# Patient Record
Sex: Male | Born: 1935
Health system: Southern US, Community
[De-identification: ages and names within clinical notes are randomized; demographics above are authoritative.]

## PROBLEM LIST (undated history)

## (undated) DIAGNOSIS — I4510 Unspecified right bundle-branch block: Secondary | ICD-10-CM

## (undated) DIAGNOSIS — M1990 Unspecified osteoarthritis, unspecified site: Secondary | ICD-10-CM

## (undated) DIAGNOSIS — G4730 Sleep apnea, unspecified: Secondary | ICD-10-CM

## (undated) DIAGNOSIS — I519 Heart disease, unspecified: Secondary | ICD-10-CM

## (undated) DIAGNOSIS — I82409 Acute embolism and thrombosis of unspecified deep veins of unspecified lower extremity: Secondary | ICD-10-CM

## (undated) DIAGNOSIS — I2699 Other pulmonary embolism without acute cor pulmonale: Secondary | ICD-10-CM

## (undated) DIAGNOSIS — I10 Essential (primary) hypertension: Secondary | ICD-10-CM

## (undated) DIAGNOSIS — G20A1 Parkinson's disease without dyskinesia, without mention of fluctuations: Secondary | ICD-10-CM

## (undated) DIAGNOSIS — G20 Parkinson's disease: Secondary | ICD-10-CM

## (undated) DIAGNOSIS — E785 Hyperlipidemia, unspecified: Secondary | ICD-10-CM

## (undated) HISTORY — DX: Unspecified osteoarthritis, unspecified site: M19.90

## (undated) HISTORY — DX: Other pulmonary embolism without acute cor pulmonale: I26.99

## (undated) HISTORY — DX: Essential (primary) hypertension: I10

## (undated) HISTORY — DX: Unspecified right bundle-branch block: I45.10

## (undated) HISTORY — PX: APPENDECTOMY: SHX54

## (undated) HISTORY — PX: CHOLECYSTECTOMY: SHX55

## (undated) HISTORY — PX: CATARACT EXTRACTION: SUR2

## (undated) HISTORY — DX: Sleep apnea, unspecified: G47.30

## (undated) HISTORY — DX: Heart disease, unspecified: I51.9

## (undated) HISTORY — PX: OTHER SURGICAL HISTORY: SHX169

## (undated) HISTORY — DX: Acute embolism and thrombosis of unspecified deep veins of unspecified lower extremity: I82.409

## (undated) HISTORY — PX: EYE SURGERY: SHX253

## (undated) HISTORY — DX: Hyperlipidemia, unspecified: E78.5

## (undated) HISTORY — DX: Parkinson's disease without dyskinesia, without mention of fluctuations: G20.A1

## (undated) HISTORY — DX: Parkinson's disease: G20

---

## 1999-08-20 ENCOUNTER — Encounter: Payer: Self-pay | Admitting: Gastroenterology

## 1999-08-20 ENCOUNTER — Ambulatory Visit (HOSPITAL_COMMUNITY): Admission: RE | Admit: 1999-08-20 | Discharge: 1999-08-20 | Payer: Self-pay | Admitting: Gastroenterology

## 1999-09-14 ENCOUNTER — Ambulatory Visit: Admission: RE | Admit: 1999-09-14 | Discharge: 1999-09-14 | Payer: Self-pay

## 2002-03-11 ENCOUNTER — Ambulatory Visit (HOSPITAL_BASED_OUTPATIENT_CLINIC_OR_DEPARTMENT_OTHER): Admission: RE | Admit: 2002-03-11 | Discharge: 2002-03-11 | Payer: Self-pay | Admitting: Internal Medicine

## 2002-05-23 ENCOUNTER — Encounter: Payer: Self-pay | Admitting: Orthopedic Surgery

## 2002-05-28 ENCOUNTER — Encounter (INDEPENDENT_AMBULATORY_CARE_PROVIDER_SITE_OTHER): Payer: Self-pay

## 2002-05-28 ENCOUNTER — Observation Stay (HOSPITAL_COMMUNITY): Admission: RE | Admit: 2002-05-28 | Discharge: 2002-05-29 | Payer: Self-pay | Admitting: Orthopedic Surgery

## 2003-10-15 ENCOUNTER — Encounter: Admission: RE | Admit: 2003-10-15 | Discharge: 2003-10-15 | Payer: Self-pay | Admitting: Internal Medicine

## 2004-12-29 ENCOUNTER — Ambulatory Visit: Payer: Self-pay | Admitting: Internal Medicine

## 2005-01-10 ENCOUNTER — Ambulatory Visit: Payer: Self-pay | Admitting: Internal Medicine

## 2005-01-14 ENCOUNTER — Ambulatory Visit (HOSPITAL_COMMUNITY): Admission: RE | Admit: 2005-01-14 | Discharge: 2005-01-14 | Payer: Self-pay | Admitting: Ophthalmology

## 2005-01-18 ENCOUNTER — Ambulatory Visit: Payer: Self-pay | Admitting: Internal Medicine

## 2005-02-16 ENCOUNTER — Ambulatory Visit: Payer: Self-pay | Admitting: Internal Medicine

## 2005-02-25 ENCOUNTER — Ambulatory Visit: Payer: Self-pay

## 2005-04-19 ENCOUNTER — Ambulatory Visit: Payer: Self-pay | Admitting: Internal Medicine

## 2005-07-14 ENCOUNTER — Ambulatory Visit (HOSPITAL_COMMUNITY): Admission: RE | Admit: 2005-07-14 | Discharge: 2005-07-14 | Payer: Self-pay | Admitting: Neurology

## 2005-07-21 ENCOUNTER — Ambulatory Visit: Payer: Self-pay | Admitting: Internal Medicine

## 2005-08-31 ENCOUNTER — Ambulatory Visit: Payer: Self-pay | Admitting: Internal Medicine

## 2005-12-22 ENCOUNTER — Ambulatory Visit: Payer: Self-pay | Admitting: Internal Medicine

## 2005-12-22 ENCOUNTER — Ambulatory Visit: Payer: Self-pay | Admitting: Cardiology

## 2005-12-22 ENCOUNTER — Encounter (INDEPENDENT_AMBULATORY_CARE_PROVIDER_SITE_OTHER): Payer: Self-pay | Admitting: Specialist

## 2005-12-22 ENCOUNTER — Inpatient Hospital Stay (HOSPITAL_COMMUNITY): Admission: AD | Admit: 2005-12-22 | Discharge: 2005-12-30 | Payer: Self-pay | Admitting: Internal Medicine

## 2005-12-23 ENCOUNTER — Ambulatory Visit: Payer: Self-pay | Admitting: Pulmonary Disease

## 2005-12-23 ENCOUNTER — Encounter: Payer: Self-pay | Admitting: Cardiology

## 2006-01-04 ENCOUNTER — Ambulatory Visit: Payer: Self-pay | Admitting: Internal Medicine

## 2006-01-06 ENCOUNTER — Ambulatory Visit: Payer: Self-pay | Admitting: Internal Medicine

## 2006-01-13 ENCOUNTER — Ambulatory Visit: Payer: Self-pay | Admitting: Internal Medicine

## 2006-01-20 ENCOUNTER — Ambulatory Visit: Payer: Self-pay | Admitting: Internal Medicine

## 2006-02-17 ENCOUNTER — Ambulatory Visit: Payer: Self-pay | Admitting: Internal Medicine

## 2006-03-22 ENCOUNTER — Ambulatory Visit: Payer: Self-pay | Admitting: Internal Medicine

## 2006-06-28 ENCOUNTER — Ambulatory Visit: Payer: Self-pay | Admitting: Internal Medicine

## 2006-09-27 ENCOUNTER — Ambulatory Visit: Payer: Self-pay | Admitting: Internal Medicine

## 2006-09-27 LAB — CONVERTED CEMR LAB
ALT: 9 units/L (ref 0–40)
AST: 42 units/L — ABNORMAL HIGH (ref 0–37)
Albumin: 4 g/dL (ref 3.5–5.2)
Alkaline Phosphatase: 75 units/L (ref 39–117)
BUN: 17 mg/dL (ref 6–23)
Bilirubin, Direct: 0.2 mg/dL (ref 0.0–0.3)
CO2: 32 meq/L (ref 19–32)
Calcium: 10.2 mg/dL (ref 8.4–10.5)
Chloride: 103 meq/L (ref 96–112)
Chol/HDL Ratio, serum: 5.3
Cholesterol: 170 mg/dL (ref 0–200)
Creatinine, Ser: 1.1 mg/dL (ref 0.4–1.5)
GFR calc non Af Amer: 70 mL/min
Glomerular Filtration Rate, Af Am: 85 mL/min/{1.73_m2}
Glucose, Bld: 98 mg/dL (ref 70–99)
HDL: 32 mg/dL — ABNORMAL LOW (ref >39.0)
LDL Cholesterol: 118 mg/dL — ABNORMAL HIGH (ref 0–99)
Potassium: 4.5 meq/L (ref 3.5–5.1)
Sodium: 142 meq/L (ref 135–145)
Total Bilirubin: 0.9 mg/dL (ref 0.3–1.2)
Total Protein: 7.3 g/dL (ref 6.0–8.3)
Triglyceride fasting, serum: 98 mg/dL (ref 0–149)
VLDL: 20 mg/dL (ref 0–40)

## 2007-01-24 ENCOUNTER — Ambulatory Visit: Payer: Self-pay | Admitting: Internal Medicine

## 2007-01-24 ENCOUNTER — Encounter: Admission: RE | Admit: 2007-01-24 | Discharge: 2007-01-24 | Payer: Self-pay | Admitting: Internal Medicine

## 2007-04-12 ENCOUNTER — Encounter: Admission: RE | Admit: 2007-04-12 | Discharge: 2007-04-12 | Payer: Self-pay | Admitting: Orthopedic Surgery

## 2007-04-25 ENCOUNTER — Ambulatory Visit: Payer: Self-pay | Admitting: Pulmonary Disease

## 2007-05-23 ENCOUNTER — Ambulatory Visit: Payer: Self-pay | Admitting: Internal Medicine

## 2007-05-23 LAB — CONVERTED CEMR LAB
ALT: 26 units/L (ref 0–40)
AST: 52 units/L — ABNORMAL HIGH (ref 0–37)
Albumin: 3.9 g/dL (ref 3.5–5.2)
Alkaline Phosphatase: 74 units/L (ref 39–117)
BUN: 22 mg/dL (ref 6–23)
Bilirubin, Direct: 0.2 mg/dL (ref 0.0–0.3)
CO2: 30 meq/L (ref 19–32)
Calcium: 9.8 mg/dL (ref 8.4–10.5)
Chloride: 98 meq/L (ref 96–112)
Cholesterol: 174 mg/dL (ref 0–200)
Creatinine, Ser: 1 mg/dL (ref 0.4–1.5)
GFR calc Af Amer: 95 mL/min
GFR calc non Af Amer: 78 mL/min
Glucose, Bld: 92 mg/dL (ref 70–99)
HDL: 31.3 mg/dL — ABNORMAL LOW (ref >39.0)
LDL Cholesterol: 111 mg/dL — ABNORMAL HIGH (ref 0–99)
Potassium: 3.9 meq/L (ref 3.5–5.1)
Sodium: 140 meq/L (ref 135–145)
Total Bilirubin: 1 mg/dL (ref 0.3–1.2)
Total CHOL/HDL Ratio: 5.6
Total Protein: 6.7 g/dL (ref 6.0–8.3)
Triglycerides: 158 mg/dL — ABNORMAL HIGH (ref 0–149)
VLDL: 32 mg/dL (ref 0–40)

## 2007-07-02 ENCOUNTER — Ambulatory Visit: Payer: Self-pay | Admitting: Internal Medicine

## 2007-07-02 DIAGNOSIS — I519 Heart disease, unspecified: Secondary | ICD-10-CM | POA: Insufficient documentation

## 2007-07-02 DIAGNOSIS — I10 Essential (primary) hypertension: Secondary | ICD-10-CM

## 2007-07-02 DIAGNOSIS — M1711 Unilateral primary osteoarthritis, right knee: Secondary | ICD-10-CM

## 2007-07-02 DIAGNOSIS — G20 Parkinson's disease: Secondary | ICD-10-CM

## 2007-07-02 DIAGNOSIS — E785 Hyperlipidemia, unspecified: Secondary | ICD-10-CM | POA: Insufficient documentation

## 2007-07-02 DIAGNOSIS — M1611 Unilateral primary osteoarthritis, right hip: Secondary | ICD-10-CM | POA: Insufficient documentation

## 2007-07-02 DIAGNOSIS — M109 Gout, unspecified: Secondary | ICD-10-CM | POA: Insufficient documentation

## 2007-07-02 DIAGNOSIS — I4510 Unspecified right bundle-branch block: Secondary | ICD-10-CM

## 2007-07-02 HISTORY — DX: Essential (primary) hypertension: I10

## 2007-08-13 DIAGNOSIS — I2699 Other pulmonary embolism without acute cor pulmonale: Secondary | ICD-10-CM

## 2007-08-13 HISTORY — DX: Other pulmonary embolism without acute cor pulmonale: I26.99

## 2007-08-20 ENCOUNTER — Inpatient Hospital Stay (HOSPITAL_COMMUNITY): Admission: RE | Admit: 2007-08-20 | Discharge: 2007-08-24 | Payer: Self-pay | Admitting: Orthopedic Surgery

## 2007-08-20 HISTORY — PX: TOTAL HIP ARTHROPLASTY: SHX124

## 2007-08-23 ENCOUNTER — Ambulatory Visit: Payer: Self-pay | Admitting: Physical Medicine & Rehabilitation

## 2007-09-26 ENCOUNTER — Ambulatory Visit: Payer: Self-pay | Admitting: Internal Medicine

## 2007-09-27 LAB — CONVERTED CEMR LAB
ALT: 11 units/L (ref 0–53)
AST: 33 units/L (ref 0–37)
Albumin: 3.6 g/dL (ref 3.5–5.2)
Alkaline Phosphatase: 150 units/L — ABNORMAL HIGH (ref 39–117)
BUN: 25 mg/dL — ABNORMAL HIGH (ref 6–23)
Bilirubin, Direct: 0.2 mg/dL (ref 0.0–0.3)
CO2: 30 meq/L (ref 19–32)
Calcium: 9.6 mg/dL (ref 8.4–10.5)
Chloride: 105 meq/L (ref 96–112)
Cholesterol: 158 mg/dL (ref 0–200)
Creatinine, Ser: 1.4 mg/dL (ref 0.4–1.5)
GFR calc Af Amer: 64 mL/min
GFR calc non Af Amer: 53 mL/min
Glucose, Bld: 119 mg/dL — ABNORMAL HIGH (ref 70–99)
HDL: 28.6 mg/dL — ABNORMAL LOW (ref >39.0)
LDL Cholesterol: 95 mg/dL (ref 0–99)
Potassium: 3.9 meq/L (ref 3.5–5.1)
Sodium: 142 meq/L (ref 135–145)
Total Bilirubin: 1 mg/dL (ref 0.3–1.2)
Total CHOL/HDL Ratio: 5.5
Total Protein: 6.6 g/dL (ref 6.0–8.3)
Triglycerides: 173 mg/dL — ABNORMAL HIGH (ref 0–149)
VLDL: 35 mg/dL (ref 0–40)

## 2007-09-28 ENCOUNTER — Telehealth: Payer: Self-pay | Admitting: Internal Medicine

## 2007-09-30 ENCOUNTER — Ambulatory Visit: Payer: Self-pay | Admitting: Vascular Surgery

## 2007-09-30 ENCOUNTER — Ambulatory Visit: Payer: Self-pay | Admitting: Internal Medicine

## 2007-09-30 ENCOUNTER — Inpatient Hospital Stay (HOSPITAL_COMMUNITY): Admission: EM | Admit: 2007-09-30 | Discharge: 2007-10-04 | Payer: Self-pay | Admitting: Emergency Medicine

## 2007-09-30 ENCOUNTER — Encounter: Payer: Self-pay | Admitting: Internal Medicine

## 2007-09-30 ENCOUNTER — Ambulatory Visit: Payer: Self-pay | Admitting: Cardiology

## 2007-10-04 ENCOUNTER — Telehealth (INDEPENDENT_AMBULATORY_CARE_PROVIDER_SITE_OTHER): Payer: Self-pay | Admitting: *Deleted

## 2007-10-08 ENCOUNTER — Ambulatory Visit: Payer: Self-pay | Admitting: Internal Medicine

## 2007-10-08 DIAGNOSIS — I2699 Other pulmonary embolism without acute cor pulmonale: Secondary | ICD-10-CM

## 2007-10-08 LAB — CONVERTED CEMR LAB
INR: 1.3
Prothrombin Time: 14.1 s

## 2007-10-10 ENCOUNTER — Telehealth: Payer: Self-pay | Admitting: Internal Medicine

## 2007-10-11 ENCOUNTER — Telehealth (INDEPENDENT_AMBULATORY_CARE_PROVIDER_SITE_OTHER): Payer: Self-pay | Admitting: *Deleted

## 2007-10-12 ENCOUNTER — Ambulatory Visit: Payer: Self-pay | Admitting: Internal Medicine

## 2007-10-12 LAB — CONVERTED CEMR LAB
INR: 1.6
Prothrombin Time: 15.5 s

## 2007-10-16 DIAGNOSIS — Z86718 Personal history of other venous thrombosis and embolism: Secondary | ICD-10-CM

## 2007-10-17 ENCOUNTER — Encounter: Payer: Self-pay | Admitting: Internal Medicine

## 2007-10-17 ENCOUNTER — Telehealth: Payer: Self-pay | Admitting: Family Medicine

## 2007-10-22 ENCOUNTER — Ambulatory Visit: Payer: Self-pay | Admitting: Internal Medicine

## 2007-11-04 IMAGING — CR DG CHEST 1V PORT
2 series · 2 of 2 positions shown · non-contrast
Comparison: None.

Portable semi upright AP chest, 09/30/2007, [DATE] hours.
INDICATION: Shortness of breath.

[view not recorded (1 of 2)]
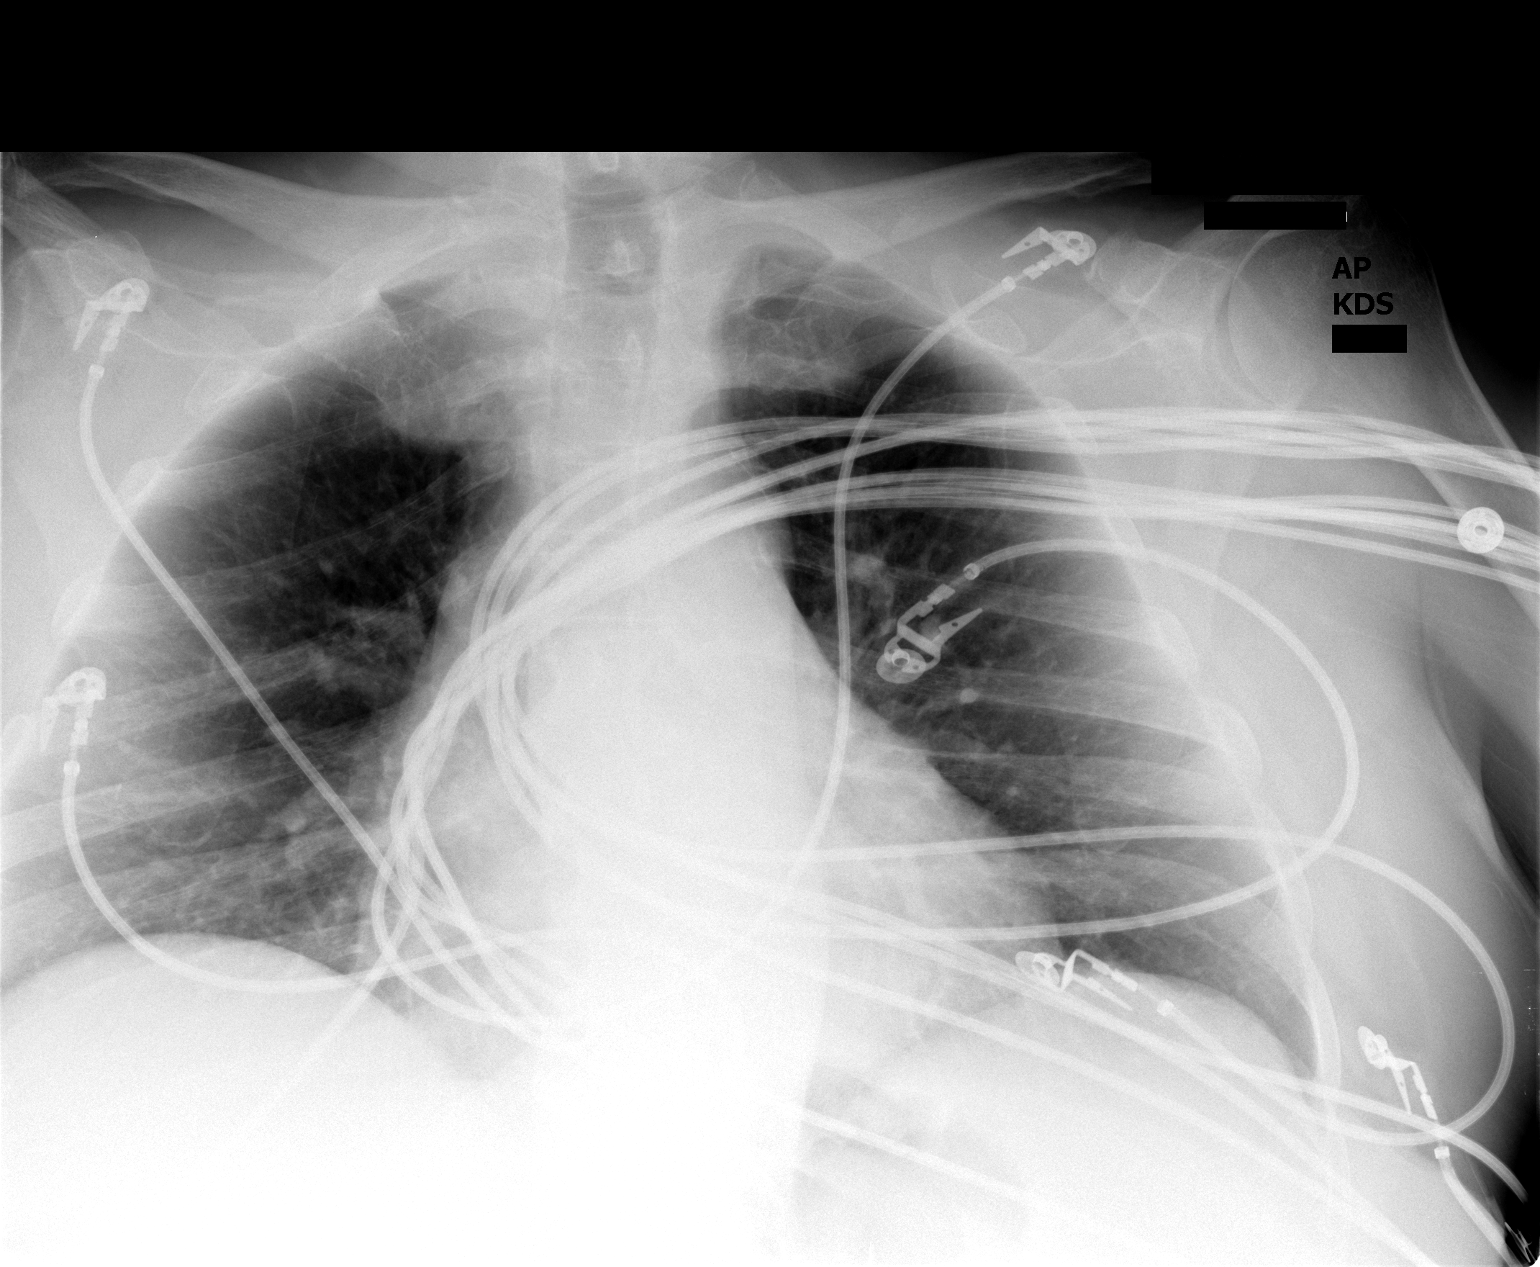

[view not recorded (2 of 2)]
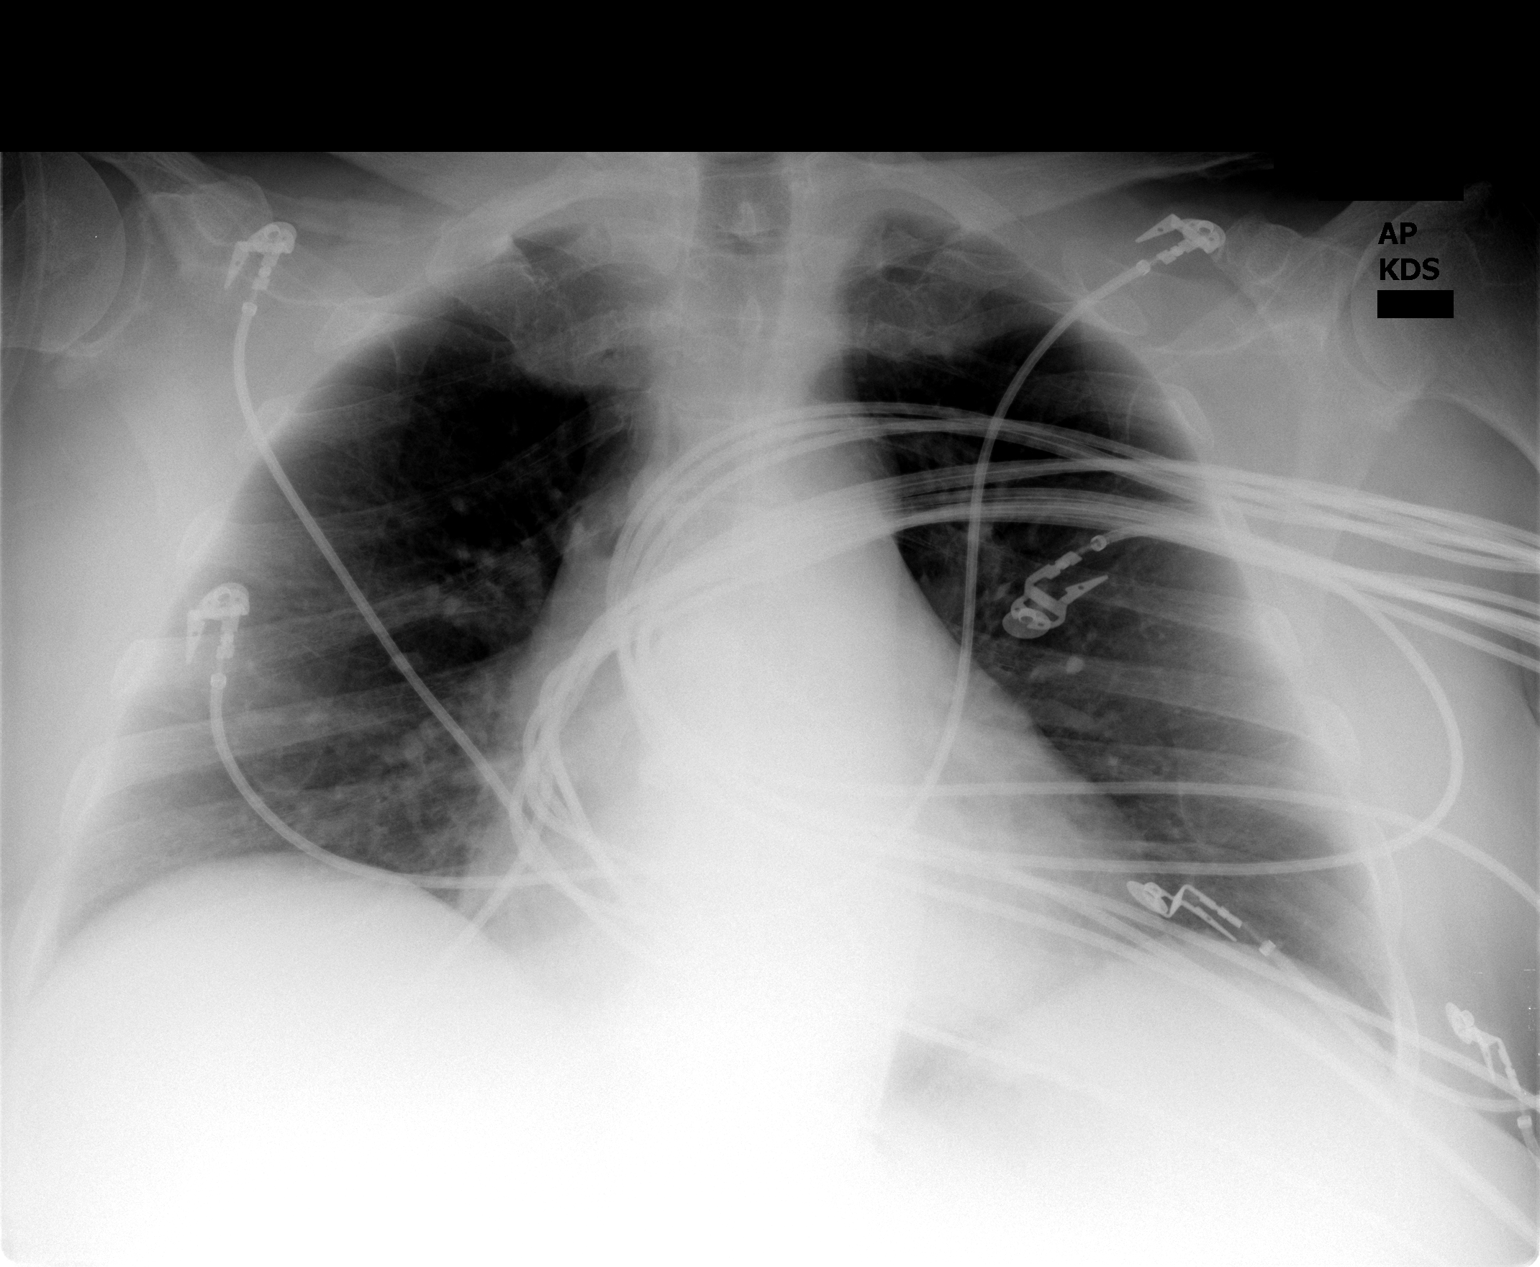

[2 of 2 positions shown; findings below may reference images not displayed]

FINDINGS: Reversed lordotic projection. Heart size mildly enlarged. Numerous
monitoring leads over the chest. No edema or focal airspace consolidation
identified.

Resolution of left lower lobe airspace disease compared to prior examination.
IMPRESSION: Resolved left lower lobe opacity, without active cardiopulmonary
disease.

## 2007-11-05 ENCOUNTER — Ambulatory Visit: Payer: Self-pay | Admitting: Internal Medicine

## 2007-11-05 LAB — CONVERTED CEMR LAB
INR: 2.7
Prothrombin Time: 19.9 s

## 2007-11-14 ENCOUNTER — Ambulatory Visit: Payer: Self-pay

## 2007-11-15 ENCOUNTER — Ambulatory Visit: Payer: Self-pay | Admitting: Cardiology

## 2007-11-19 ENCOUNTER — Ambulatory Visit: Payer: Self-pay | Admitting: Internal Medicine

## 2007-11-19 LAB — CONVERTED CEMR LAB
INR: 3.1
Prothrombin Time: 21.3 s

## 2007-12-03 ENCOUNTER — Ambulatory Visit: Payer: Self-pay | Admitting: Internal Medicine

## 2007-12-03 LAB — CONVERTED CEMR LAB
INR: 2.9
Prothrombin Time: 20.6 s

## 2007-12-19 ENCOUNTER — Ambulatory Visit: Payer: Self-pay | Admitting: Internal Medicine

## 2007-12-19 LAB — CONVERTED CEMR LAB: Prothrombin Time: 17.8 s

## 2007-12-20 LAB — CONVERTED CEMR LAB
ALT: 9 units/L (ref 0–53)
AST: 25 units/L (ref 0–37)
Basophils Relative: 0.8 % (ref 0.0–1.0)
Bilirubin, Direct: 0.1 mg/dL (ref 0.0–0.3)
CO2: 30 meq/L (ref 19–32)
Calcium: 9.6 mg/dL (ref 8.4–10.5)
Chloride: 101 meq/L (ref 96–112)
Creatinine, Ser: 0.9 mg/dL (ref 0.4–1.5)
Eosinophils Relative: 1.9 % (ref 0.0–5.0)
Glucose, Bld: 94 mg/dL (ref 70–99)
Lymphocytes Relative: 23.7 % (ref 12.0–46.0)
Platelets: 168 10*3/uL (ref 150–400)
RBC: 5.2 M/uL (ref 4.22–5.81)
RDW: 15.4 % — ABNORMAL HIGH (ref 11.5–14.6)
Total Protein: 7.1 g/dL (ref 6.0–8.3)
WBC: 5 10*3/uL (ref 4.5–10.5)

## 2008-02-06 ENCOUNTER — Ambulatory Visit: Payer: Self-pay | Admitting: Internal Medicine

## 2008-02-06 DIAGNOSIS — R609 Edema, unspecified: Secondary | ICD-10-CM | POA: Insufficient documentation

## 2008-02-20 ENCOUNTER — Ambulatory Visit: Payer: Self-pay | Admitting: Internal Medicine

## 2008-03-24 ENCOUNTER — Ambulatory Visit: Payer: Self-pay | Admitting: Internal Medicine

## 2008-03-24 DIAGNOSIS — R269 Unspecified abnormalities of gait and mobility: Secondary | ICD-10-CM

## 2008-03-30 ENCOUNTER — Encounter: Admission: RE | Admit: 2008-03-30 | Discharge: 2008-03-30 | Payer: Self-pay | Admitting: Internal Medicine

## 2008-04-10 ENCOUNTER — Ambulatory Visit: Payer: Self-pay | Admitting: Internal Medicine

## 2008-04-10 LAB — CONVERTED CEMR LAB
INR: 1.8
Prothrombin Time: 16.4 s

## 2008-04-11 ENCOUNTER — Telehealth: Payer: Self-pay | Admitting: Internal Medicine

## 2008-04-24 ENCOUNTER — Ambulatory Visit: Payer: Self-pay | Admitting: Internal Medicine

## 2008-04-24 LAB — CONVERTED CEMR LAB
INR: 1.9
Prothrombin Time: 16.8 s

## 2008-05-13 ENCOUNTER — Telehealth: Payer: Self-pay | Admitting: Internal Medicine

## 2008-05-26 ENCOUNTER — Ambulatory Visit: Payer: Self-pay | Admitting: Internal Medicine

## 2008-06-04 ENCOUNTER — Ambulatory Visit: Payer: Self-pay | Admitting: Cardiology

## 2008-06-04 LAB — CONVERTED CEMR LAB
AST: 29 units/L (ref 0–37)
Albumin: 4.1 g/dL (ref 3.5–5.2)
Alkaline Phosphatase: 85 units/L (ref 39–117)
BUN: 23 mg/dL (ref 6–23)
Bilirubin, Direct: 0.1 mg/dL (ref 0.0–0.3)
Chloride: 99 meq/L (ref 96–112)
GFR calc non Af Amer: 78 mL/min
Glucose, Bld: 92 mg/dL (ref 70–99)
Potassium: 3.4 meq/L — ABNORMAL LOW (ref 3.5–5.1)
Sodium: 138 meq/L (ref 135–145)
VLDL: 45 mg/dL — ABNORMAL HIGH (ref 0–40)

## 2008-06-11 ENCOUNTER — Ambulatory Visit: Payer: Self-pay | Admitting: Internal Medicine

## 2008-06-23 ENCOUNTER — Ambulatory Visit: Payer: Self-pay | Admitting: Cardiology

## 2008-06-23 LAB — CONVERTED CEMR LAB
CO2: 32 meq/L (ref 19–32)
Calcium: 9.5 mg/dL (ref 8.4–10.5)
Chloride: 102 meq/L (ref 96–112)
Glucose, Bld: 113 mg/dL — ABNORMAL HIGH (ref 70–99)
Sodium: 142 meq/L (ref 135–145)

## 2008-07-10 ENCOUNTER — Ambulatory Visit: Payer: Self-pay | Admitting: Internal Medicine

## 2008-07-13 ENCOUNTER — Encounter: Admission: RE | Admit: 2008-07-13 | Discharge: 2008-07-13 | Payer: Self-pay | Admitting: Internal Medicine

## 2008-07-21 ENCOUNTER — Ambulatory Visit: Payer: Self-pay | Admitting: Cardiology

## 2008-07-21 LAB — CONVERTED CEMR LAB
Albumin: 3.9 g/dL (ref 3.5–5.2)
Cholesterol: 103 mg/dL (ref 0–200)
HDL: 32.9 mg/dL — ABNORMAL LOW (ref >39.0)
LDL Cholesterol: 50 mg/dL (ref 0–99)
Total CHOL/HDL Ratio: 3.1
Total Protein: 7.2 g/dL (ref 6.0–8.3)
Triglycerides: 103 mg/dL (ref 0–149)
VLDL: 21 mg/dL (ref 0–40)

## 2008-07-30 ENCOUNTER — Ambulatory Visit: Payer: Self-pay | Admitting: Internal Medicine

## 2008-07-30 LAB — CONVERTED CEMR LAB
INR: 2.4
Prothrombin Time: 18.7 s

## 2008-08-27 ENCOUNTER — Ambulatory Visit: Payer: Self-pay | Admitting: Internal Medicine

## 2008-08-27 LAB — CONVERTED CEMR LAB
INR: 3.2
Prothrombin Time: 21.5 s

## 2008-09-05 ENCOUNTER — Encounter: Payer: Self-pay | Admitting: Internal Medicine

## 2008-09-17 ENCOUNTER — Encounter: Admission: RE | Admit: 2008-09-17 | Discharge: 2008-12-10 | Payer: Self-pay | Admitting: Neurosurgery

## 2008-09-22 ENCOUNTER — Ambulatory Visit: Payer: Self-pay | Admitting: Internal Medicine

## 2008-10-23 ENCOUNTER — Ambulatory Visit: Payer: Self-pay | Admitting: Internal Medicine

## 2008-10-25 ENCOUNTER — Emergency Department (HOSPITAL_BASED_OUTPATIENT_CLINIC_OR_DEPARTMENT_OTHER): Admission: EM | Admit: 2008-10-25 | Discharge: 2008-10-25 | Payer: Self-pay | Admitting: Emergency Medicine

## 2008-10-27 ENCOUNTER — Encounter: Admission: RE | Admit: 2008-10-27 | Discharge: 2008-10-27 | Payer: Self-pay | Admitting: Internal Medicine

## 2008-10-27 ENCOUNTER — Ambulatory Visit: Payer: Self-pay | Admitting: Internal Medicine

## 2008-11-19 ENCOUNTER — Encounter: Payer: Self-pay | Admitting: Internal Medicine

## 2008-11-20 ENCOUNTER — Ambulatory Visit: Payer: Self-pay | Admitting: Internal Medicine

## 2008-11-20 LAB — CONVERTED CEMR LAB: INR: 1.9

## 2008-12-17 ENCOUNTER — Ambulatory Visit: Payer: Self-pay | Admitting: Cardiology

## 2008-12-19 ENCOUNTER — Ambulatory Visit: Payer: Self-pay | Admitting: Internal Medicine

## 2008-12-19 LAB — CONVERTED CEMR LAB
INR: 1.5
Prothrombin Time: 15.2 s

## 2008-12-24 ENCOUNTER — Ambulatory Visit: Payer: Self-pay | Admitting: Cardiology

## 2008-12-24 ENCOUNTER — Encounter: Payer: Self-pay | Admitting: Internal Medicine

## 2008-12-24 ENCOUNTER — Ambulatory Visit: Payer: Self-pay

## 2008-12-24 LAB — CONVERTED CEMR LAB
ALT: 6 units/L (ref 0–53)
AST: 26 units/L (ref 0–37)
Albumin: 3.7 g/dL (ref 3.5–5.2)
Alkaline Phosphatase: 83 units/L (ref 39–117)
HDL: 35 mg/dL — ABNORMAL LOW (ref >39.0)
Total CHOL/HDL Ratio: 2.9
Triglycerides: 84 mg/dL (ref 0–149)

## 2009-01-02 ENCOUNTER — Ambulatory Visit: Payer: Self-pay | Admitting: Internal Medicine

## 2009-01-02 LAB — CONVERTED CEMR LAB: Prothrombin Time: 16.6 s

## 2009-01-05 ENCOUNTER — Telehealth: Payer: Self-pay | Admitting: Internal Medicine

## 2009-01-23 ENCOUNTER — Ambulatory Visit: Payer: Self-pay | Admitting: Internal Medicine

## 2009-01-23 LAB — CONVERTED CEMR LAB: INR: 1.9

## 2009-02-20 ENCOUNTER — Telehealth: Payer: Self-pay | Admitting: Internal Medicine

## 2009-02-20 ENCOUNTER — Ambulatory Visit: Payer: Self-pay | Admitting: Internal Medicine

## 2009-02-20 LAB — CONVERTED CEMR LAB: Prothrombin Time: 19.2 s

## 2009-02-23 ENCOUNTER — Ambulatory Visit: Payer: Self-pay | Admitting: Internal Medicine

## 2009-02-23 ENCOUNTER — Telehealth: Payer: Self-pay | Admitting: *Deleted

## 2009-02-24 LAB — CONVERTED CEMR LAB
ALT: 8 units/L (ref 0–53)
AST: 24 units/L (ref 0–37)
Albumin: 3.5 g/dL (ref 3.5–5.2)
BUN: 19 mg/dL (ref 6–23)
Basophils Relative: 0.3 % (ref 0.0–3.0)
CO2: 33 meq/L — ABNORMAL HIGH (ref 19–32)
Calcium: 9 mg/dL (ref 8.4–10.5)
Chloride: 100 meq/L (ref 96–112)
Creatinine, Ser: 1 mg/dL (ref 0.4–1.5)
Eosinophils Relative: 2.5 % (ref 0.0–5.0)
Hemoglobin: 13.9 g/dL (ref 13.0–17.0)
Lymphocytes Relative: 14.7 % (ref 12.0–46.0)
MCHC: 32.3 g/dL (ref 30.0–36.0)
MCV: 82.3 fL (ref 78.0–100.0)
Neutro Abs: 2.8 10*3/uL (ref 1.4–7.7)
Neutrophils Relative %: 66 % (ref 43.0–77.0)
RBC: 5.22 M/uL (ref 4.22–5.81)
TSH: 0.99 microintl units/mL (ref 0.35–5.50)
Total Protein: 7.6 g/dL (ref 6.0–8.3)
WBC: 4.2 10*3/uL — ABNORMAL LOW (ref 4.5–10.5)

## 2009-02-27 ENCOUNTER — Encounter: Payer: Self-pay | Admitting: Internal Medicine

## 2009-03-04 ENCOUNTER — Telehealth: Payer: Self-pay | Admitting: Internal Medicine

## 2009-03-09 ENCOUNTER — Telehealth: Payer: Self-pay | Admitting: Internal Medicine

## 2009-03-17 ENCOUNTER — Telehealth: Payer: Self-pay | Admitting: Internal Medicine

## 2009-03-18 ENCOUNTER — Ambulatory Visit: Payer: Self-pay | Admitting: Internal Medicine

## 2009-03-18 LAB — CONVERTED CEMR LAB: Prothrombin Time: 19.5 s

## 2009-04-01 ENCOUNTER — Encounter: Payer: Self-pay | Admitting: Internal Medicine

## 2009-04-08 ENCOUNTER — Telehealth (INDEPENDENT_AMBULATORY_CARE_PROVIDER_SITE_OTHER): Payer: Self-pay | Admitting: *Deleted

## 2009-04-15 ENCOUNTER — Ambulatory Visit: Payer: Self-pay | Admitting: Internal Medicine

## 2009-04-15 LAB — CONVERTED CEMR LAB
BUN: 20 mg/dL (ref 6–23)
Calcium: 9.4 mg/dL (ref 8.4–10.5)
Creatinine, Ser: 1 mg/dL (ref 0.4–1.5)
GFR calc non Af Amer: 77.78 mL/min (ref >60)
Glucose, Bld: 98 mg/dL (ref 70–99)
Potassium: 4.1 meq/L (ref 3.5–5.1)

## 2009-04-23 ENCOUNTER — Ambulatory Visit: Payer: Self-pay | Admitting: Family Medicine

## 2009-04-27 ENCOUNTER — Telehealth (INDEPENDENT_AMBULATORY_CARE_PROVIDER_SITE_OTHER): Payer: Self-pay | Admitting: *Deleted

## 2009-04-28 ENCOUNTER — Ambulatory Visit: Payer: Self-pay | Admitting: Family Medicine

## 2009-05-07 ENCOUNTER — Telehealth: Payer: Self-pay | Admitting: Internal Medicine

## 2009-05-13 ENCOUNTER — Ambulatory Visit: Payer: Self-pay | Admitting: Internal Medicine

## 2009-05-18 ENCOUNTER — Ambulatory Visit: Payer: Self-pay | Admitting: Diagnostic Radiology

## 2009-05-18 ENCOUNTER — Ambulatory Visit (HOSPITAL_BASED_OUTPATIENT_CLINIC_OR_DEPARTMENT_OTHER): Admission: RE | Admit: 2009-05-18 | Discharge: 2009-05-18 | Payer: Self-pay | Admitting: Urology

## 2009-05-18 ENCOUNTER — Emergency Department (HOSPITAL_BASED_OUTPATIENT_CLINIC_OR_DEPARTMENT_OTHER): Admission: EM | Admit: 2009-05-18 | Discharge: 2009-05-18 | Payer: Self-pay | Admitting: Emergency Medicine

## 2009-06-08 ENCOUNTER — Encounter: Payer: Self-pay | Admitting: Internal Medicine

## 2009-06-10 ENCOUNTER — Ambulatory Visit: Payer: Self-pay | Admitting: Internal Medicine

## 2009-06-10 LAB — CONVERTED CEMR LAB: INR: 2.8

## 2009-07-08 ENCOUNTER — Ambulatory Visit: Payer: Self-pay | Admitting: Internal Medicine

## 2009-08-05 ENCOUNTER — Ambulatory Visit: Payer: Self-pay | Admitting: Internal Medicine

## 2009-08-05 LAB — CONVERTED CEMR LAB
INR: 2.7
Prothrombin Time: 19.8 s

## 2009-09-02 ENCOUNTER — Ambulatory Visit: Payer: Self-pay | Admitting: Internal Medicine

## 2009-09-02 LAB — CONVERTED CEMR LAB: Prothrombin Time: 16.8 s

## 2009-10-02 ENCOUNTER — Ambulatory Visit: Payer: Self-pay | Admitting: Internal Medicine

## 2009-10-02 LAB — CONVERTED CEMR LAB: Prothrombin Time: 20.5 s

## 2009-10-09 ENCOUNTER — Encounter (INDEPENDENT_AMBULATORY_CARE_PROVIDER_SITE_OTHER): Payer: Self-pay | Admitting: *Deleted

## 2009-10-30 ENCOUNTER — Encounter (INDEPENDENT_AMBULATORY_CARE_PROVIDER_SITE_OTHER): Payer: Self-pay | Admitting: *Deleted

## 2009-10-30 ENCOUNTER — Ambulatory Visit: Payer: Self-pay | Admitting: Internal Medicine

## 2009-11-02 ENCOUNTER — Telehealth: Payer: Self-pay | Admitting: Internal Medicine

## 2009-11-02 ENCOUNTER — Encounter: Payer: Self-pay | Admitting: Internal Medicine

## 2009-11-13 ENCOUNTER — Encounter (INDEPENDENT_AMBULATORY_CARE_PROVIDER_SITE_OTHER): Payer: Self-pay | Admitting: *Deleted

## 2009-11-27 ENCOUNTER — Ambulatory Visit: Payer: Self-pay | Admitting: Internal Medicine

## 2009-11-27 LAB — CONVERTED CEMR LAB: Prothrombin Time: 16.1 s

## 2009-12-16 ENCOUNTER — Ambulatory Visit: Payer: Self-pay | Admitting: Internal Medicine

## 2009-12-16 LAB — CONVERTED CEMR LAB: INR: 1.2

## 2009-12-17 ENCOUNTER — Telehealth: Payer: Self-pay | Admitting: Internal Medicine

## 2009-12-23 ENCOUNTER — Encounter: Payer: Self-pay | Admitting: Pulmonary Disease

## 2009-12-30 ENCOUNTER — Ambulatory Visit: Payer: Self-pay | Admitting: Internal Medicine

## 2009-12-30 LAB — CONVERTED CEMR LAB
INR: 2.1
Prothrombin Time: 17.9 s

## 2010-01-27 ENCOUNTER — Ambulatory Visit: Payer: Self-pay | Admitting: Internal Medicine

## 2010-01-27 LAB — CONVERTED CEMR LAB: INR: 2.2

## 2010-02-19 ENCOUNTER — Encounter: Payer: Self-pay | Admitting: Internal Medicine

## 2010-02-25 ENCOUNTER — Ambulatory Visit: Payer: Self-pay | Admitting: Internal Medicine

## 2010-03-26 ENCOUNTER — Encounter: Payer: Self-pay | Admitting: Internal Medicine

## 2010-04-06 ENCOUNTER — Telehealth (INDEPENDENT_AMBULATORY_CARE_PROVIDER_SITE_OTHER): Payer: Self-pay | Admitting: *Deleted

## 2010-04-21 ENCOUNTER — Ambulatory Visit: Payer: Self-pay | Admitting: Internal Medicine

## 2010-04-22 LAB — CONVERTED CEMR LAB
ALT: 20 units/L (ref 0–53)
AST: 26 units/L (ref 0–37)
Albumin: 4 g/dL (ref 3.5–5.2)
BUN: 22 mg/dL (ref 6–23)
Basophils Relative: 0.3 % (ref 0.0–3.0)
Chloride: 100 meq/L (ref 96–112)
Cholesterol: 99 mg/dL (ref 0–200)
Creatinine, Ser: 0.9 mg/dL (ref 0.4–1.5)
Eosinophils Relative: 2.3 % (ref 0.0–5.0)
GFR calc non Af Amer: 92.31 mL/min (ref >60)
Glucose, Bld: 82 mg/dL (ref 70–99)
HCT: 40 % (ref 39.0–52.0)
Lymphs Abs: 1.3 10*3/uL (ref 0.7–4.0)
MCV: 83.7 fL (ref 78.0–100.0)
Monocytes Absolute: 0.5 10*3/uL (ref 0.1–1.0)
Neutro Abs: 3.4 10*3/uL (ref 1.4–7.7)
Potassium: 4.7 meq/L (ref 3.5–5.1)
RBC: 4.78 M/uL (ref 4.22–5.81)
WBC: 5.3 10*3/uL (ref 4.5–10.5)

## 2010-05-03 ENCOUNTER — Telehealth: Payer: Self-pay | Admitting: Internal Medicine

## 2010-05-14 ENCOUNTER — Ambulatory Visit: Payer: Self-pay | Admitting: Internal Medicine

## 2010-05-18 ENCOUNTER — Ambulatory Visit: Payer: Self-pay | Admitting: Internal Medicine

## 2010-05-19 ENCOUNTER — Ambulatory Visit: Payer: Self-pay | Admitting: Family Medicine

## 2010-05-21 ENCOUNTER — Ambulatory Visit: Payer: Self-pay | Admitting: Family Medicine

## 2010-06-02 ENCOUNTER — Ambulatory Visit: Payer: Self-pay | Admitting: Internal Medicine

## 2010-06-28 ENCOUNTER — Ambulatory Visit: Payer: Self-pay | Admitting: Family Medicine

## 2010-06-30 ENCOUNTER — Encounter: Payer: Self-pay | Admitting: Internal Medicine

## 2010-07-26 ENCOUNTER — Ambulatory Visit: Payer: Self-pay | Admitting: Internal Medicine

## 2010-08-11 ENCOUNTER — Encounter: Payer: Self-pay | Admitting: Internal Medicine

## 2010-08-26 ENCOUNTER — Ambulatory Visit: Payer: Self-pay | Admitting: Internal Medicine

## 2010-08-30 LAB — CONVERTED CEMR LAB
ALT: 11 units/L (ref 0–53)
Bilirubin, Direct: 0.2 mg/dL (ref 0.0–0.3)
CO2: 31 meq/L (ref 19–32)
Calcium: 8.9 mg/dL (ref 8.4–10.5)
Glucose, Bld: 97 mg/dL (ref 70–99)
Potassium: 3.8 meq/L (ref 3.5–5.1)
Sodium: 141 meq/L (ref 135–145)
Total Bilirubin: 0.7 mg/dL (ref 0.3–1.2)

## 2010-09-07 ENCOUNTER — Ambulatory Visit: Payer: Self-pay | Admitting: Internal Medicine

## 2010-09-23 ENCOUNTER — Ambulatory Visit: Payer: Self-pay | Admitting: Internal Medicine

## 2010-10-07 ENCOUNTER — Encounter: Payer: Self-pay | Admitting: Internal Medicine

## 2010-10-07 ENCOUNTER — Ambulatory Visit: Payer: Self-pay | Admitting: Internal Medicine

## 2010-10-28 ENCOUNTER — Ambulatory Visit: Payer: Self-pay | Admitting: Internal Medicine

## 2010-11-24 ENCOUNTER — Ambulatory Visit: Payer: Self-pay | Admitting: Internal Medicine

## 2010-11-24 LAB — CONVERTED CEMR LAB: INR: 2.6

## 2010-12-22 ENCOUNTER — Ambulatory Visit
Admission: RE | Admit: 2010-12-22 | Discharge: 2010-12-22 | Payer: Self-pay | Source: Home / Self Care | Attending: Internal Medicine | Admitting: Internal Medicine

## 2010-12-25 ENCOUNTER — Encounter: Payer: Self-pay | Admitting: Internal Medicine

## 2010-12-25 DIAGNOSIS — I749 Embolism and thrombosis of unspecified artery: Secondary | ICD-10-CM

## 2011-01-13 NOTE — Assessment & Plan Note (Signed)
Summary: pt/njr   Nurse Visit   Allergies: No Known Drug Allergies Laboratory Results   Blood Tests     PT: 18.1 s   (Normal Range: 10.6-13.4)  INR: 2.2   (Normal Range: 0.88-1.12   Therap INR: 2.0-3.5) Comments: Rita Ohara  January 27, 2010 9:25 AM     Orders Added: 1)  Est. Patient Level I [99211] 2)  Protime [57846NG]   ANTICOAGULATION RECORD PREVIOUS REGIMEN & LAB RESULTS Anticoagulation Diagnosis:  v58.83,v58.61,415.19 on  10/08/2007 Previous INR Goal Range:  2.0-3.0 on  05/26/2008 Previous INR:  2.1 on  12/30/2009 Previous Coumadin Dose(mg):  7.5mg ,7.5mg ,5mg  on  06/11/2008 Previous Regimen:  same on  12/30/2009 Previous Coagulation Comments:  Had been off for a week bcause of a procedure started back about a week ago. on  11/27/2009  NEW REGIMEN & LAB RESULTS Current INR: 2.2 Regimen: same  Repeat testing in: 1 month  Anticoagulation Visit Questionnaire Coumadin dose missed/changed:  No Abnormal Bleeding Symptoms:  No  Any diet changes including alcohol intake, vegetables or greens since the last visit:  No Any illnesses or hospitalizations since the last visit:  No Any signs of clotting since the last visit (including chest discomfort, dizziness, shortness of breath, arm tingling, slurred speech, swelling or redness in leg):  No  MEDICATIONS ALLOPURINOL 100 MG TABS (ALLOPURINOL) Take 1 tablet by mouth once a day COLACE 100 MG  CAPS (DOCUSATE SODIUM) Take 1 tablet by mouth two times a day FLOMAX 0.4 MG CP24 (TAMSULOSIN HCL) use 1 capsule by mouth once a day FUROSEMIDE 40 MG TABS (FUROSEMIDE) 2 pills every morning KLOR-CON M20 20 MEQ TBCR (POTASSIUM CHLORIDE CRYS CR) take 2 tabs by mouth twice daily REQUIP 4 MG  TABS (ROPINIROLE HCL) Take 1 tablet by mouth four times a day SINEMET CR 50-200 MG TBCR (CARBIDOPA-LEVODOPA) Take 1 four times a day COUMADIN 5 MG  TABS (WARFARIN SODIUM) as directed daily LOTRISONE 1-0.05 % CREA (CLOTRIMAZOLE-BETAMETHASONE)  once a day to feet BASE A POLYETHYLENE GLYCOL   POWD (POLYETHYLENE GLYCOL 1450) 17 gm every other day mixed with water CRESTOR 10 MG  TABS (ROSUVASTATIN CALCIUM) once daily CYCLOBENZAPRINE HCL 10 MG  TABS (CYCLOBENZAPRINE HCL) three times a day ELDEPRYL 5 MG CAPS (SELEGILINE HCL) Take 1 tablet by mouth two times a day SANCTURA XR 60 MG XR24H-CAP (TROSPIUM CHLORIDE) one tab two times a day CEPHALEXIN 500 MG CAPS (CEPHALEXIN) one by mouth three times a day for 7 days

## 2011-01-13 NOTE — Assessment & Plan Note (Signed)
Summary: pt//ccm   Nurse Visit   Allergies: No Known Drug Allergies Laboratory Results   Blood Tests   Date/Time Received: June 02, 2010 10:03 AM  Date/Time Reported: June 02, 2010 10:03 AM    INR: 2.1   (Normal Range: 0.88-1.12   Therap INR: 2.0-3.5) Comments: Wynona Canes, CMA  June 02, 2010 10:03 AM     Orders Added: 1)  Est. Patient Level I [99211] 2)  Protime [[id number]]  Laboratory Results   Blood Tests      INR: 2.1   (Normal Range: 0.88-1.12   Therap INR: 2.0-3.5) Comments: Wynona Canes, CMA  June 02, 2010 10:03 AM       ANTICOAGULATION RECORD PREVIOUS REGIMEN & LAB RESULTS Anticoagulation Diagnosis:  v58.83,v58.61,415.19 on  10/08/2007 Previous INR Goal Range:  2.0-3.0 on  05/26/2008 Previous INR:  3.8 on  05/18/2010 Previous Coumadin Dose(mg):  7.5mg  qg on  02/25/2010 Previous Regimen:  7.5mg  qd 5mg  m,th on  05/18/2010 Previous Coagulation Comments:  Had been off for a week bcause of a procedure started back about a week ago. on  11/27/2009  NEW REGIMEN & LAB RESULTS Current INR: 2.1 Current Coumadin Dose(mg): 5mg  on mon, & thu 7.5mg  on other days Regimen: 7.5mg  qd 5mg  m,th  (no change)       Repeat testing in: 4 weeks MEDICATIONS ALLOPURINOL 100 MG TABS (ALLOPURINOL) Take 1 tablet by mouth once a day COLACE 100 MG  CAPS (DOCUSATE SODIUM) Take 1 tablet by mouth two times a day FLOMAX 0.4 MG CP24 (TAMSULOSIN HCL) use 1 capsule by mouth once a day FUROSEMIDE 40 MG TABS (FUROSEMIDE) 2 pills every morning KLOR-CON M20 20 MEQ TBCR (POTASSIUM CHLORIDE CRYS CR) take 2 tabs by mouth twice daily REQUIP 4 MG  TABS (ROPINIROLE HCL) Take 1 tablet by mouth four times a day SINEMET CR 50-200 MG TBCR (CARBIDOPA-LEVODOPA) Take 1 four times a day COUMADIN 5 MG  TABS (WARFARIN SODIUM) as directed daily 1 1/2 at present (04/21/10) CRESTOR 10 MG  TABS (ROSUVASTATIN CALCIUM) once daily ELDEPRYL 5 MG CAPS (SELEGILINE HCL) Take 1 tablet by mouth two times a  day SANCTURA XR 60 MG XR24H-CAP (TROSPIUM CHLORIDE) Take 1 tablet by mouth once a day NITROFURANTOIN MACROCRYSTAL 100 MG CAPS (NITROFURANTOIN MACROCRYSTAL) Take 1 capsule by mouth at bedtime TRAMADOL HCL 50 MG TABS (TRAMADOL HCL) one tablet up to four times a day   Anticoagulation Visit Questionnaire      Coumadin dose missed/changed:  No      Abnormal Bleeding Symptoms:  No   Any diet changes including alcohol intake, vegetables or greens since the last visit:  No Any illnesses or hospitalizations since the last visit:  No Any signs of clotting since the last visit (including chest discomfort, dizziness, shortness of breath, arm tingling, slurred speech, swelling or redness in leg):  No

## 2011-01-13 NOTE — Progress Notes (Signed)
Summary: [facility]  Phone Note Refill Request   Refills Requested: Medication #1:  CRESTOR 10 MG  TABS once daily   Last Refilled: 01/05/2009 [facility] fax-----[phone].  Initial call taken by: Warnell Forester,  December 17, 2009 10:43 AM  Follow-up for Phone Call        Rx faxed to pharmacy via computer. Follow-up by: Gladis Riffle, RN,  December 17, 2009 11:35 AM    Prescriptions: CRESTOR 10 MG  TABS (ROSUVASTATIN CALCIUM) once daily  #90 x 4   Entered by:   Gladis Riffle, RN   Authorized by:   Birdie Sons MD   Signed by:   Gladis Riffle, RN on 12/17/2009   Method used:   Faxed to ...       [facility] (mail-order)             , [place]         Ph:        Fax: [phone]   RxID:   [id number]

## 2011-01-13 NOTE — Assessment & Plan Note (Signed)
Summary: leg swelling-then to pt lab//ccm   Vital Signs:  Patient profile:   77 year old male Height:      70 inches Weight:      250 pounds BMI:     36.00 Temp:     98.4 degrees F oral BP sitting:   142 / 82  (left arm) Cuff size:   regular  Vitals Entered By: Kern Reap CMA Duncan Dull) (August 26, 2010 9:33 AM) CC: leg swelling Is Patient Diabetic? No Pain Assessment Patient in pain? no        CC:  leg swelling.  History of Present Illness:  Follow-Up Visit      This is a 77 year old man who presents for Follow-up visit.  The patient denies chest pain and palpitations.  Since the last visit the patient notes no new problems or concerns.  The patient reports taking meds as prescribed.  When questioned about possible medication side effects, the patient notes none.  has some chronic LE edema---worsens with prolonged standing  All other systems reviewed and were negative   Current Problems (verified): 1)  Encounter For Therapeutic Drug Monitoring  (ICD-V58.83) 2)  Coumadin Therapy  (ICD-V58.61) 3)  Gait Disturbance  (ICD-781.2) 4)  Edema  (ICD-782.3) 5)  Dvt, Hx of  (ICD-V12.51) 6)  Coumadin Therapy  (ICD-V58.61) 7)  Pulmonary Embolism  (ICD-415.19) 8)  Osteoarthritis  (ICD-715.90) 9)  Hypertension  (ICD-401.9) 10)  Hyperlipidemia  (ICD-272.4) 11)  Gout  (ICD-274.9) 12)  Degenerative Joint Disease, Right Hip  (ICD-715.95) 13)  Rbbb  (ICD-426.4) 14)  Diastolic Dysfunction  (ICD-429.9) 15)  Parkinson's Disease  (ICD-332.0)  Current Medications (verified): 1)  Allopurinol 100 Mg Tabs (Allopurinol) .... Take 1 Tablet By Mouth Once A Day 2)  Colace 100 Mg  Caps (Docusate Sodium) .... Take 1 Tablet By Mouth Two Times A Day 3)  Flomax 0.4 Mg Cp24 (Tamsulosin Hcl) .... Use 1 Capsule By Mouth Once A Day 4)  Furosemide 40 Mg Tabs (Furosemide) .... 2 Pills Every Morning 5)  Klor-Con M20 20 Meq Tbcr (Potassium Chloride Crys Cr) .... Take 2 Tabs By Mouth Twice Daily 6)   Requip 4 Mg  Tabs (Ropinirole Hcl) .... Take 1 Tablet By Mouth Four Times A Day 7)  Sinemet Cr 50-200 Mg Tbcr (Carbidopa-Levodopa) .... Take 1 Four Times A Day 8)  Coumadin 5 Mg  Tabs (Warfarin Sodium) .... As Directed Daily 1 1/2 At Present (04/21/10) 9)  Crestor 10 Mg  Tabs (Rosuvastatin Calcium) .... Once Daily 10)  Eldepryl 5 Mg Caps (Selegiline Hcl) .... Take 1 Tablet By Mouth Two Times A Day 11)  Sanctura Xr 60 Mg Xr24h-Cap (Trospium Chloride) .... Take 1 Tablet By Mouth Once A Day 12)  Nitrofurantoin Macrocrystal 100 Mg Caps (Nitrofurantoin Macrocrystal) .... Take 1 Capsule By Mouth At Bedtime 13)  Glycolax  Powd (Polyethylene Glycol 3350) .... As Needed 14)  Hydrocodone-Acetaminophen 10-325 Mg Tabs (Hydrocodone-Acetaminophen) .... Take One Tab Three Times A Day By Mouth As Needed For Pain  Allergies (verified): No Known Drug Allergies  Past History:  Past Medical History: Last updated: 11/19/2007 sleep apnea RBBB parkinsons impaired LV relaxation Gout Hyperlipidemia Hypertension Osteoarthritis PE 2008 (september).  DVT, hx of  Past Surgical History: Last updated: 09/26/2007 Appendectomy Cholecystectomy dupruytens contracture Total hip replacement 9/8/8  Family History: Last updated: 07/18/2007 Mother deceased--MI-82 yo Father deceased--cancer unknown type (GI)-70  Social History: Last updated: Jul 18, 2007 Retired Married Never Smoked  Risk Factors: Smoking Status: never (05/18/2010)  Physical Exam  General:  alert and well-developed.   Head:  normocephalic and atraumatic.   Eyes:  pupils equal and pupils round.   Neck:  No deformities, masses, or tenderness noted. Chest Wall:  No deformities, masses, tenderness or gynecomastia noted. Lungs:  normal respiratory effort and no intercostal retractions.   Heart:  normal rate and regular rhythm.   Abdomen:  soft and non-tender.   Skin:  turgor normal and color normal.     Impression &  Recommendations:  Problem # 1:  EDEMA (ICD-782.3)  suspect related to venous insuff---continue stockings check labs  keep legs elevated His updated medication list for this problem includes:    Furosemide 40 Mg Tabs (Furosemide) .Marland Kitchen... 2 pills every morning  Orders: Venipuncture (04540) Specimen Handling (98119) TLB-Renal Function Panel (80069-RENAL) TLB-Hepatic/Liver Function Pnl (80076-HEPATIC)  Problem # 2:  PARKINSON'S DISEASE (ICD-332.0) followed gy neurology  Complete Medication List: 1)  Allopurinol 100 Mg Tabs (Allopurinol) .... Take 1 tablet by mouth once a day 2)  Colace 100 Mg Caps (Docusate sodium) .... Take 1 tablet by mouth two times a day 3)  Flomax 0.4 Mg Cp24 (Tamsulosin hcl) .... Use 1 capsule by mouth once a day 4)  Furosemide 40 Mg Tabs (Furosemide) .... 2 pills every morning 5)  Klor-con M20 20 Meq Tbcr (Potassium chloride crys cr) .... Take 2 tabs by mouth twice daily 6)  Requip 4 Mg Tabs (Ropinirole hcl) .... Take 1 tablet by mouth four times a day 7)  Sinemet Cr 50-200 Mg Tbcr (Carbidopa-levodopa) .... Take 1 four times a day 8)  Coumadin 5 Mg Tabs (Warfarin sodium) .... As directed daily 1 1/2 at present (04/21/10) 9)  Crestor 10 Mg Tabs (Rosuvastatin calcium) .... Once daily 10)  Eldepryl 5 Mg Caps (Selegiline hcl) .... Take 1 tablet by mouth two times a day 11)  Sanctura Xr 60 Mg Xr24h-cap (Trospium chloride) .... Take 1 tablet by mouth once a day 12)  Nitrofurantoin Macrocrystal 100 Mg Caps (Nitrofurantoin macrocrystal) .... Take 1 capsule by mouth at bedtime 13)  Glycolax Powd (Polyethylene glycol 3350) .... As needed 14)  Hydrocodone-acetaminophen 10-325 Mg Tabs (Hydrocodone-acetaminophen) .... Take one tab three times a day by mouth as needed for pain  Other Orders: Protime (14782NF)   ANTICOAGULATION RECORD PREVIOUS REGIMEN & LAB RESULTS Anticoagulation Diagnosis:  v58.83,v58.61,415.19 on  10/08/2007 Previous INR Goal Range:  2.0-3.0 on   05/26/2008 Previous INR:  2.7 on  07/26/2010 Previous Coumadin Dose(mg):  5mg  on mon, & thu 7.5mg  on other days on  06/02/2010 Previous Regimen:  Same dose on  07/26/2010 Previous Coagulation Comments:  Had been off for a week bcause of a procedure started back about a week ago. on  11/27/2009  NEW REGIMEN & LAB RESULTS Current INR: 2.8 Regimen: 7.5mg . M, TH 7mg . other days this is what the patient has been taking  Repeat testing in: 4 weeks  Anticoagulation Visit Questionnaire Coumadin dose missed/changed:  No Abnormal Bleeding Symptoms:  No  Any diet changes including alcohol intake, vegetables or greens since the last visit:  No Any illnesses or hospitalizations since the last visit:  No Any signs of clotting since the last visit (including chest discomfort, dizziness, shortness of breath, arm tingling, slurred speech, swelling or redness in leg):  No  MEDICATIONS ALLOPURINOL 100 MG TABS (ALLOPURINOL) Take 1 tablet by mouth once a day COLACE 100 MG  CAPS (DOCUSATE SODIUM) Take 1 tablet by mouth two times a day FLOMAX 0.4 MG CP24 (  TAMSULOSIN HCL) use 1 capsule by mouth once a day FUROSEMIDE 40 MG TABS (FUROSEMIDE) 2 pills every morning KLOR-CON M20 20 MEQ TBCR (POTASSIUM CHLORIDE CRYS CR) take 2 tabs by mouth twice daily REQUIP 4 MG  TABS (ROPINIROLE HCL) Take 1 tablet by mouth four times a day SINEMET CR 50-200 MG TBCR (CARBIDOPA-LEVODOPA) Take 1 four times a day COUMADIN 5 MG  TABS (WARFARIN SODIUM) as directed daily 1 1/2 at present (04/21/10) CRESTOR 10 MG  TABS (ROSUVASTATIN CALCIUM) once daily ELDEPRYL 5 MG CAPS (SELEGILINE HCL) Take 1 tablet by mouth two times a day SANCTURA XR 60 MG XR24H-CAP (TROSPIUM CHLORIDE) Take 1 tablet by mouth once a day NITROFURANTOIN MACROCRYSTAL 100 MG CAPS (NITROFURANTOIN MACROCRYSTAL) Take 1 capsule by mouth at bedtime GLYCOLAX  POWD (POLYETHYLENE GLYCOL 3350) as needed HYDROCODONE-ACETAMINOPHEN 10-325 MG TABS (HYDROCODONE-ACETAMINOPHEN) take  one tab three times a day by mouth as needed for pain    Laboratory Results   Blood Tests      INR: 2.8   (Normal Range: 0.88-1.12   Therap INR: 2.0-3.5) Comments: Rita Ohara  August 26, 2010 10:06 AM

## 2011-01-13 NOTE — Assessment & Plan Note (Signed)
Summary: pt/njr   Nurse Visit   Allergies: No Known Drug Allergies Laboratory Results   Blood Tests   Date/Time Received: December 22, 2010 11:03 AM  Date/Time Reported: December 22, 2010 11:03 AM    INR: 2.8   (Normal Range: 0.88-1.12   Therap INR: 2.0-3.5) Comments: Wynona Canes, CMA  December 22, 2010 11:03 AM     Orders Added: 1)  New Patient Level I [99201] 2)  Protime [[id number]]  Laboratory Results   Blood Tests      INR: 2.8   (Normal Range: 0.88-1.12   Therap INR: 2.0-3.5) Comments: Wynona Canes, CMA  December 22, 2010 11:03 AM       ANTICOAGULATION RECORD PREVIOUS REGIMEN & LAB RESULTS Anticoagulation Diagnosis:  v58.83,v58.61,415.19 on  10/08/2007 Previous INR Goal Range:  2.0-3.0 on  05/26/2008 Previous INR:  2.6 on  11/24/2010 Previous Coumadin Dose(mg):  7.5mg  on mon & thu, 7mg  on other days on  09/23/2010 Previous Regimen:  same on  11/24/2010 Previous Coagulation Comments:  Had been off for a week bcause of a procedure started back about a week ago. on  11/27/2009  NEW REGIMEN & LAB RESULTS Current INR: 2.8 Regimen: same  (no change)       Repeat testing in: 4 weeks MEDICATIONS ALLOPURINOL 100 MG TABS (ALLOPURINOL) Take 1 tablet by mouth once a day COLACE 100 MG  CAPS (DOCUSATE SODIUM) Take 1 tablet by mouth two times a day FLOMAX 0.4 MG CP24 (TAMSULOSIN HCL) use 1 capsule by mouth once a day FUROSEMIDE 40 MG TABS (FUROSEMIDE) 2 pills every morning KLOR-CON M20 20 MEQ TBCR (POTASSIUM CHLORIDE CRYS CR) take 2 tabs by mouth twice daily REQUIP 4 MG  TABS (ROPINIROLE HCL) Take 1 tablet by mouth four times a day SINEMET CR 50-200 MG TBCR (CARBIDOPA-LEVODOPA) Take 1 four times a day COUMADIN 5 MG  TABS (WARFARIN SODIUM) as directed daily 1 1/2 at present (04/21/10) CRESTOR 10 MG  TABS (ROSUVASTATIN CALCIUM) once daily ELDEPRYL 5 MG CAPS (SELEGILINE HCL) Take 1 tablet by mouth two times a day SANCTURA XR 60 MG XR24H-CAP (TROSPIUM CHLORIDE)  Take 1 tablet by mouth once a day NITROFURANTOIN MACROCRYSTAL 100 MG CAPS (NITROFURANTOIN MACROCRYSTAL) Take 1 capsule by mouth at bedtime GLYCOLAX  POWD (POLYETHYLENE GLYCOL 3350) as needed HYDROCODONE-ACETAMINOPHEN 10-325 MG TABS (HYDROCODONE-ACETAMINOPHEN) take one tab three times a day by mouth as needed for pain CLOTRIMAZOLE-BETAMETHASONE 1-0.05 % CREA (CLOTRIMAZOLE-BETAMETHASONE) apply to feet once daily   Anticoagulation Visit Questionnaire      Coumadin dose missed/changed:  No      Abnormal Bleeding Symptoms:  No   Any diet changes including alcohol intake, vegetables or greens since the last visit:  No Any illnesses or hospitalizations since the last visit:  No Any signs of clotting since the last visit (including chest discomfort, dizziness, shortness of breath, arm tingling, slurred speech, swelling or redness in leg):  No

## 2011-01-13 NOTE — Assessment & Plan Note (Signed)
Summary: PT/CJR--RECTAL BLEEDING---OK PER ELLEN//CCM   Vital Signs:  Patient profile:   77 year old male Pulse rate:   72 / minute Pulse rhythm:   regular Resp:     14 per minute BP sitting:   138 / 66  (left arm) Cuff size:   regular  Vitals Entered By: Gladis Riffle, RN (May 18, 2010 8:21 AM) CC: c/o rectal pain so hurts to sit Is Patient Diabetic? No   CC:  c/o rectal pain so hurts to sit.  History of Present Illness: rectal pain for 3 days no fever or chills tried prep H without relief pain can be severe worse with BM  All other systems reviewed and were negative   Preventive Screening-Counseling & Management  Alcohol-Tobacco     Smoking Status: never  Current Medications (verified): 1)  Allopurinol 100 Mg Tabs (Allopurinol) .... Take 1 Tablet By Mouth Once A Day 2)  Colace 100 Mg  Caps (Docusate Sodium) .... Take 1 Tablet By Mouth Two Times A Day 3)  Flomax 0.4 Mg Cp24 (Tamsulosin Hcl) .... Use 1 Capsule By Mouth Once A Day 4)  Furosemide 40 Mg Tabs (Furosemide) .... 2 Pills Every Morning 5)  Klor-Con M20 20 Meq Tbcr (Potassium Chloride Crys Cr) .... Take 2 Tabs By Mouth Twice Daily 6)  Requip 4 Mg  Tabs (Ropinirole Hcl) .... Take 1 Tablet By Mouth Four Times A Day 7)  Sinemet Cr 50-200 Mg Tbcr (Carbidopa-Levodopa) .... Take 1 Four Times A Day 8)  Coumadin 5 Mg  Tabs (Warfarin Sodium) .... As Directed Daily 1 1/2 At Present (04/21/10) 9)  Crestor 10 Mg  Tabs (Rosuvastatin Calcium) .... Once Daily 10)  Eldepryl 5 Mg Caps (Selegiline Hcl) .... Take 1 Tablet By Mouth Two Times A Day 11)  Sanctura Xr 60 Mg Xr24h-Cap (Trospium Chloride) .... Take 1 Tablet By Mouth Once A Day 12)  Nitrofurantoin Macrocrystal 100 Mg Caps (Nitrofurantoin Macrocrystal) .... Take 1 Capsule By Mouth At Bedtime 13)  Tramadol Hcl 50 Mg Tabs (Tramadol Hcl) .... One Tablet Up To Four Times A Day  Allergies: No Known Drug Allergies  Physical Exam  General:  alert and well-developed.   leans to  left Rectal:  no obvious hemorrhoid ? rectal fissure   Impression & Recommendations:  Problem # 1:  RECTAL PAIN (WJX-914.78) trial anusol supp call if sxs recur/persist (friday)  Complete Medication List: 1)  Allopurinol 100 Mg Tabs (Allopurinol) .... Take 1 tablet by mouth once a day 2)  Colace 100 Mg Caps (Docusate sodium) .... Take 1 tablet by mouth two times a day 3)  Flomax 0.4 Mg Cp24 (Tamsulosin hcl) .... Use 1 capsule by mouth once a day 4)  Furosemide 40 Mg Tabs (Furosemide) .... 2 pills every morning 5)  Klor-con M20 20 Meq Tbcr (Potassium chloride crys cr) .... Take 2 tabs by mouth twice daily 6)  Requip 4 Mg Tabs (Ropinirole hcl) .... Take 1 tablet by mouth four times a day 7)  Sinemet Cr 50-200 Mg Tbcr (Carbidopa-levodopa) .... Take 1 four times a day 8)  Coumadin 5 Mg Tabs (Warfarin sodium) .... As directed daily 1 1/2 at present (04/21/10) 9)  Crestor 10 Mg Tabs (Rosuvastatin calcium) .... Once daily 10)  Eldepryl 5 Mg Caps (Selegiline hcl) .... Take 1 tablet by mouth two times a day 11)  Sanctura Xr 60 Mg Xr24h-cap (Trospium chloride) .... Take 1 tablet by mouth once a day 12)  Nitrofurantoin Macrocrystal 100 Mg Caps (  Nitrofurantoin macrocrystal) .... Take 1 capsule by mouth at bedtime 13)  Tramadol Hcl 50 Mg Tabs (Tramadol hcl) .... One tablet up to four times a day 14)  Anusol-hc 25 Mg Supp (Hydrocortisone acetate) .... Insert into rectum two times a day for 7 days  Other Orders: Fingerstick (16109) Protime ([id number])  Patient Instructions: 1)  . Prescriptions: ANUSOL-HC 25 MG SUPP (HYDROCORTISONE ACETATE) insert into rectum two times a day for 7 days  #30 x 1   Entered and Authorized by:   Birdie Sons MD   Signed by:   Birdie Sons MD on 05/18/2010   Method used:   Electronically to        [facility] [phone]* (retail)       [address]       [place], [place]  [address]       Ph: [phone]       Fax: [phone]   RxID:    [id number]   Appended Document: PT/CJR--RECTAL BLEEDING---OK PER ELLEN//CCM   ANTICOAGULATION RECORD PREVIOUS REGIMEN & LAB RESULTS Anticoagulation Diagnosis:  v58.83,v58.61,415.19 on  10/08/2007 Previous INR Goal Range:  2.0-3.0 on  05/26/2008 Previous INR:  2.8 on  04/21/2010 Previous Coumadin Dose(mg):  7.5mg  qg on  02/25/2010 Previous Regimen:  7.5mg  qd on  02/25/2010 Previous Coagulation Comments:  Had been off for a week bcause of a procedure started back about a week ago. on  11/27/2009  NEW REGIMEN & LAB RESULTS Current INR: 3.8 Regimen: 7.5mg  qd 5mg  m,th  Repeat testing in: 2 weeks  Anticoagulation Visit Questionnaire Coumadin dose missed/changed:  Yes Coumadin Dose Comments:  one or more missed dose(s) Abnormal Bleeding Symptoms:  No  Any diet changes including alcohol intake, vegetables or greens since the last visit:  No Any illnesses or hospitalizations since the last visit:  No Any signs of clotting since the last visit (including chest discomfort, dizziness, shortness of breath, arm tingling, slurred speech, swelling or redness in leg):  No  MEDICATIONS ALLOPURINOL 100 MG TABS (ALLOPURINOL) Take 1 tablet by mouth once a day COLACE 100 MG  CAPS (DOCUSATE SODIUM) Take 1 tablet by mouth two times a day FLOMAX 0.4 MG CP24 (TAMSULOSIN HCL) use 1 capsule by mouth once a day FUROSEMIDE 40 MG TABS (FUROSEMIDE) 2 pills every morning KLOR-CON M20 20 MEQ TBCR (POTASSIUM CHLORIDE CRYS CR) take 2 tabs by mouth twice daily REQUIP 4 MG  TABS (ROPINIROLE HCL) Take 1 tablet by mouth four times a day SINEMET CR 50-200 MG TBCR (CARBIDOPA-LEVODOPA) Take 1 four times a day COUMADIN 5 MG  TABS (WARFARIN SODIUM) as directed daily 1 1/2 at present (04/21/10) CRESTOR 10 MG  TABS (ROSUVASTATIN CALCIUM) once daily ELDEPRYL 5 MG CAPS (SELEGILINE HCL) Take 1 tablet by mouth two times a day SANCTURA XR 60 MG XR24H-CAP (TROSPIUM CHLORIDE) Take 1 tablet by mouth once a  day NITROFURANTOIN MACROCRYSTAL 100 MG CAPS (NITROFURANTOIN MACROCRYSTAL) Take 1 capsule by mouth at bedtime TRAMADOL HCL 50 MG TABS (TRAMADOL HCL) one tablet up to four times a day ANUSOL-HC 25 MG SUPP (HYDROCORTISONE ACETATE) insert into rectum two times a day for 7 days    Laboratory Results   Urine Tests    Routine Urinalysis   Color: yellow Appearance: Clear Glucose: negative   (Normal Range: Negative) Bilirubin: negative   (Normal Range: Negative) Ketone: negative   (Normal Range: Negative) Spec. Gravity: 1.010   (Normal Range: 1.003-1.035) Blood: trace-lysed   (  Normal Range: Negative) pH: 5.0   (Normal Range: 5.0-8.0) Protein: negative   (Normal Range: Negative) Urobilinogen: 0.2   (Normal Range: 0-1) Nitrite: negative   (Normal Range: Negative) Leukocyte Esterace: negative   (Normal Range: Negative)    Comments: Rita Ohara  May 18, 2010 11:45 AM   Blood Tests      INR: 3.8   (Normal Range: 0.88-1.12   Therap INR: 2.0-3.5) Comments: Rita Ohara  May 18, 2010 8:49 AM

## 2011-01-13 NOTE — Assessment & Plan Note (Signed)
Summary: pt//ccm   Nurse Visit   Allergies: No Known Drug Allergies Laboratory Results   Blood Tests   Date/Time Received: July 26, 2010 12:51 PM  Date/Time Reported: July 26, 2010 12:51 PM    INR: 2.7   (Normal Range: 0.88-1.12   Therap INR: 2.0-3.5) Comments: Wynona Canes, CMA  July 26, 2010 12:51 PM     Orders Added: 1)  Est. Patient Level I [99211] 2)  Protime [40981XB]  Laboratory Results   Blood Tests      INR: 2.7   (Normal Range: 0.88-1.12   Therap INR: 2.0-3.5) Comments: Wynona Canes, CMA  July 26, 2010 12:51 PM       ANTICOAGULATION RECORD PREVIOUS REGIMEN & LAB RESULTS Anticoagulation Diagnosis:  v58.83,v58.61,415.19 on  10/08/2007 Previous INR Goal Range:  2.0-3.0 on  05/26/2008 Previous INR:  2.3 on  06/28/2010 Previous Coumadin Dose(mg):  5mg  on mon, & thu 7.5mg  on other days on  06/02/2010 Previous Regimen:  7.5mg  qd 5mg  m,th on  05/18/2010 Previous Coagulation Comments:  Had been off for a week bcause of a procedure started back about a week ago. on  11/27/2009  NEW REGIMEN & LAB RESULTS Current INR: 2.7 Regimen: Same dose       Repeat testing in: 4 weeks MEDICATIONS ALLOPURINOL 100 MG TABS (ALLOPURINOL) Take 1 tablet by mouth once a day COLACE 100 MG  CAPS (DOCUSATE SODIUM) Take 1 tablet by mouth two times a day FLOMAX 0.4 MG CP24 (TAMSULOSIN HCL) use 1 capsule by mouth once a day FUROSEMIDE 40 MG TABS (FUROSEMIDE) 2 pills every morning KLOR-CON M20 20 MEQ TBCR (POTASSIUM CHLORIDE CRYS CR) take 2 tabs by mouth twice daily REQUIP 4 MG  TABS (ROPINIROLE HCL) Take 1 tablet by mouth four times a day SINEMET CR 50-200 MG TBCR (CARBIDOPA-LEVODOPA) Take 1 four times a day COUMADIN 5 MG  TABS (WARFARIN SODIUM) as directed daily 1 1/2 at present (04/21/10) CRESTOR 10 MG  TABS (ROSUVASTATIN CALCIUM) once daily ELDEPRYL 5 MG CAPS (SELEGILINE HCL) Take 1 tablet by mouth two times a day SANCTURA XR 60 MG XR24H-CAP (TROSPIUM CHLORIDE)  Take 1 tablet by mouth once a day NITROFURANTOIN MACROCRYSTAL 100 MG CAPS (NITROFURANTOIN MACROCRYSTAL) Take 1 capsule by mouth at bedtime TRAMADOL HCL 50 MG TABS (TRAMADOL HCL) one tablet up to four times a day   Anticoagulation Visit Questionnaire      Coumadin dose missed/changed:  No      Abnormal Bleeding Symptoms:  No   Any diet changes including alcohol intake, vegetables or greens since the last visit:  No Any illnesses or hospitalizations since the last visit:  No Any signs of clotting since the last visit (including chest discomfort, dizziness, shortness of breath, arm tingling, slurred speech, swelling or redness in leg):  No

## 2011-01-13 NOTE — Letter (Signed)
Summary: [facility]  [facility]   Imported By: Maryln Gottron 07/22/2010 13:23:14  _____________________________________________________________________  External Attachment:    Type:   Image     Comment:   External Document

## 2011-01-13 NOTE — Letter (Signed)
Summary: [facility]  [facility]   Imported By: Maryln Gottron 03/10/2010 12:45:03  _____________________________________________________________________  External Attachment:    Type:   Image     Comment:   External Document

## 2011-01-13 NOTE — Assessment & Plan Note (Signed)
Summary: PT // RS/pt rescd//ccm fu on meds/njr   Vital Signs:  Patient profile:   77 year old male Weight:      251 pounds BMI:     36.14 Temp:     98.3 degrees F oral Pulse rate:   72 / minute Pulse rhythm:   regular Resp:     14 per minute BP sitting:   118 / 66  (left arm) Cuff size:   regular  Vitals Entered By: Gladis Riffle, RN (Apr 21, 2010 10:00 AM) CC: medication review--needs refills potassium, lotrisone--to have PT Is Patient Diabetic? No   CC:  medication review--needs refills potassium and lotrisone--to have PT.  History of Present Illness:  Follow-Up Visit      This is a 77 year old man who presents for Follow-up visit.  The patient denies chest pain and palpitations.  Since the last visit the patient notes no new problems or concerns and being seen by a specialist.  The patient reports taking meds as prescribed.  When questioned about possible medication side effects, the patient notes none.  Multiple, chronic complaints: lower extremity swelling---improved, urinary urgency---improved  All other systems reviewed and were negative   Preventive Screening-Counseling & Management  Alcohol-Tobacco     Smoking Status: never  Current Problems (verified): 1)  Coumadin Therapy  (ICD-V58.61) 2)  Back Pain  (ICD-724.5) 3)  Gait Disturbance  (ICD-781.2) 4)  Edema  (ICD-782.3) 5)  Dvt, Hx of  (ICD-V12.51) 6)  Coumadin Therapy  (ICD-V58.61) 7)  Pulmonary Embolism  (ICD-415.19) 8)  Osteoarthritis  (ICD-715.90) 9)  Hypertension  (ICD-401.9) 10)  Hyperlipidemia  (ICD-272.4) 11)  Gout  (ICD-274.9) 12)  Degenerative Joint Disease, Right Hip  (ICD-715.95) 13)  Rbbb  (ICD-426.4) 14)  Diastolic Dysfunction  (ICD-429.9) 15)  Parkinson's Disease  (ICD-332.0)  Current Medications (verified): 1)  Allopurinol 100 Mg Tabs (Allopurinol) .... Take 1 Tablet By Mouth Once A Day 2)  Colace 100 Mg  Caps (Docusate Sodium) .... Take 1 Tablet By Mouth Two Times A Day 3)  Flomax 0.4 Mg  Cp24 (Tamsulosin Hcl) .... Use 1 Capsule By Mouth Once A Day 4)  Furosemide 40 Mg Tabs (Furosemide) .... 2 Pills Every Morning 5)  Klor-Con M20 20 Meq Tbcr (Potassium Chloride Crys Cr) .... Take 2 Tabs By Mouth Twice Daily 6)  Requip 4 Mg  Tabs (Ropinirole Hcl) .... Take 1 Tablet By Mouth Four Times A Day 7)  Sinemet Cr 50-200 Mg Tbcr (Carbidopa-Levodopa) .... Take 1 Four Times A Day 8)  Coumadin 5 Mg  Tabs (Warfarin Sodium) .... As Directed Daily 1 1/2 At Present (04/21/10) 9)  Lotrisone 1-0.05 % Crea (Clotrimazole-Betamethasone) .... Once A Day To Feet 10)  Base A Polyethylene Glycol   Powd (Polyethylene Glycol 1450) .Marland KitchenMarland KitchenMarland Kitchen 17 Gm Every Other Day Mixed With Water 11)  Crestor 10 Mg  Tabs (Rosuvastatin Calcium) .... Once Daily 12)  Cyclobenzaprine Hcl 10 Mg  Tabs (Cyclobenzaprine Hcl) .... Three Times A Day 13)  Eldepryl 5 Mg Caps (Selegiline Hcl) .... Take 1 Tablet By Mouth Two Times A Day 14)  Sanctura Xr 60 Mg Xr24h-Cap (Trospium Chloride) .... One Tab Two Times A Day 15)  Cephalexin 500 Mg Caps (Cephalexin) .... One By Mouth Three Times A Day For 7 Days 16)  Nitrofurantoin Macrocrystal 100 Mg Caps (Nitrofurantoin Macrocrystal) .... Take 1 Capsule By Mouth At Bedtime 17)  Tramadol Hcl 50 Mg Tabs (Tramadol Hcl) .... One Tablet Up To Four Times A Day 18)  Vemma Vitamin Liquid--No Vitamin K .... 2 Oz Once Daily  Allergies (verified): No Known Drug Allergies  Past History:  Past Medical History: Last updated: 11/19/2007 sleep apnea RBBB parkinsons impaired LV relaxation Gout Hyperlipidemia Hypertension Osteoarthritis PE 2008 (september).  DVT, hx of  Past Surgical History: Last updated: 09/26/2007 Appendectomy Cholecystectomy dupruytens contracture Total hip replacement 9/8/8  Family History: Last updated: 2007/07/26 Mother deceased--MI-82 yo Father deceased--cancer unknown type (GI)-70  Social History: Last updated: July 26, 2007 Retired Married Never Smoked  Risk  Factors: Smoking Status: never (04/21/2010)  Review of Systems       All other systems reviewed and were negative   Physical Exam  General:  alert and well-developed.   Head:  normocephalic and atraumatic.   Eyes:  pupils equal and pupils round.   Ears:  R ear normal and L ear normal.   Neck:  No deformities, masses, or tenderness noted. Chest Wall:  No deformities, masses, tenderness or gynecomastia noted. Lungs:  normal respiratory effort and no intercostal retractions.   Heart:  normal rate and regular rhythm.   Abdomen:  soft and non-tender.   Msk:  leans to right Neurologic:  cranial nerves II-XII intact.  broad based gait, short steps   Impression & Recommendations:  Problem # 1:  PULMONARY EMBOLISM (ICD-415.19) monthly protime His updated medication list for this problem includes:    Coumadin 5 Mg Tabs (Warfarin sodium) .Marland Kitchen... As directed daily 1 1/2 at present (04/21/10)  Reviewed the following: PT: 19.8 (02/25/2010)   INR: 2.7 (02/25/2010)    Next Protime: 4 weeks (dated on 02/25/2010)  Problem # 2:  BACK PAIN (ICD-724.5) chronic pain doing reasonably well His updated medication list for this problem includes:    Cyclobenzaprine Hcl 10 Mg Tabs (Cyclobenzaprine hcl) .Marland Kitchen... Three times a day    Tramadol Hcl 50 Mg Tabs (Tramadol hcl) ..... One tablet up to four times a day  Problem # 3:  GOUT (ICD-274.9) no recurrence His updated medication list for this problem includes:    Allopurinol 100 Mg Tabs (Allopurinol) .Marland Kitchen... Take 1 tablet by mouth once a day  Problem # 4:  DIASTOLIC DYSFUNCTION (ICD-429.9) clinically stable  Problem # 5:  HYPERLIPIDEMIA (ICD-272.4)  needs labs today His updated medication list for this problem includes:    Crestor 10 Mg Tabs (Rosuvastatin calcium) ..... Once daily  Labs Reviewed: SGOT: 24 (02/23/2009)   SGPT: 8 (02/23/2009)   HDL:35.0 (12/24/2008), 32.9 (07/21/2008)  LDL:51 (12/24/2008), 50 (07/21/2008)  Chol:103 (12/24/2008), 103  (07/21/2008)  Trig:84 (12/24/2008), 103 (07/21/2008)  Orders: TLB-Hepatic/Liver Function Pnl (80076-HEPATIC) TLB-Lipid Panel (80061-LIPID) TLB-TSH (Thyroid Stimulating Hormone) (84443-TSH) dry mouth artificial saliva  Complete Medication List: 1)  Allopurinol 100 Mg Tabs (Allopurinol) .... Take 1 tablet by mouth once a day 2)  Colace 100 Mg Caps (Docusate sodium) .... Take 1 tablet by mouth two times a day 3)  Flomax 0.4 Mg Cp24 (Tamsulosin hcl) .... Use 1 capsule by mouth once a day 4)  Furosemide 40 Mg Tabs (Furosemide) .... 2 pills every morning 5)  Klor-con M20 20 Meq Tbcr (Potassium chloride crys cr) .... Take 2 tabs by mouth twice daily 6)  Requip 4 Mg Tabs (Ropinirole hcl) .... Take 1 tablet by mouth four times a day 7)  Sinemet Cr 50-200 Mg Tbcr (Carbidopa-levodopa) .... Take 1 four times a day 8)  Coumadin 5 Mg Tabs (Warfarin sodium) .... As directed daily 1 1/2 at present (04/21/10) 9)  Lotrisone 1-0.05 % Crea (Clotrimazole-betamethasone) .... Once a  day to feet 10)  Base A Polyethylene Glycol Powd (Polyethylene glycol 1450) .Marland KitchenMarland Kitchen. 17 gm every other day mixed with water 11)  Crestor 10 Mg Tabs (Rosuvastatin calcium) .... Once daily 12)  Cyclobenzaprine Hcl 10 Mg Tabs (Cyclobenzaprine hcl) .... Three times a day 13)  Eldepryl 5 Mg Caps (Selegiline hcl) .... Take 1 tablet by mouth two times a day 14)  Sanctura Xr 60 Mg Xr24h-cap (Trospium chloride) .... One tab two times a day 15)  Nitrofurantoin Macrocrystal 100 Mg Caps (Nitrofurantoin macrocrystal) .... Take 1 capsule by mouth at bedtime 16)  Tramadol Hcl 50 Mg Tabs (Tramadol hcl) .... One tablet up to four times a day 17)  Vemma Vitamin Liquid--no Vitamin K  .... 2 oz once daily 18)  Mouth Kote Soln (Artificial saliva) .... Use three times daily  Other Orders: Venipuncture (16109) TLB-BMP (Basic Metabolic Panel-BMET) (80048-METABOL) TLB-CBC Platelet - w/Differential (85025-CBCD) Protime (60454UJ)  Patient Instructions: 1)   Please schedule a follow-up appointment in 4 months. Prescriptions: LOTRISONE 1-0.05 % CREA (CLOTRIMAZOLE-BETAMETHASONE) once a day to feet  #30 grams x 1   Entered and Authorized by:   Birdie Sons MD   Signed by:   Birdie Sons MD on 04/21/2010   Method used:   Electronically to        [facility] [phone]* (retail)       [address]       [place], [place]  [address]       Ph: [phone]       Fax: [phone]   RxID:   [id number] KLOR-CON M20 20 MEQ TBCR (POTASSIUM CHLORIDE CRYS CR) take 2 tabs by mouth twice daily  #400 x 3   Entered and Authorized by:   Birdie Sons MD   Signed by:   Birdie Sons MD on 04/21/2010   Method used:   Electronically to        [facility] [phone]* (retail)       [address]       [place], [place]  [address]       Ph: [phone]       Fax: [phone]   RxID:   [id number]   Appended Document: Orders Update     Clinical Lists Changes  Observations: Added new observation of ABNORM BLEED: No (04/21/2010 10:39) Added new observation of COUMADIN CHG: No (04/21/2010 10:39) Added new observation of NEXT PT: 4 weeks (04/21/2010 10:39) Added new observation of COMMENTS2: Wynona Canes, CMA  Apr 21, 2010 10:39 AM  (04/21/2010 10:39) Added new observation of INR: 2.8  (04/21/2010 10:39) Added new observation of PT PATIENT: 20.2 s (04/21/2010 10:39)      Laboratory Results   Blood Tests   Date/Time Recieved: Apr 21, 2010 10:39 AM  Date/Time Reported: Apr 21, 2010 10:39 AM   PT: 20.2 s   (Normal Range: 10.6-13.4)  INR: 2.8   (Normal Range: 0.88-1.12   Therap INR: 2.0-3.5) Comments: Wynona Canes, CMA  Apr 21, 2010 10:39 AM       ANTICOAGULATION RECORD PREVIOUS REGIMEN & LAB RESULTS Anticoagulation Diagnosis:  v58.83,v58.61,415.19 on  10/08/2007 Previous INR Goal Range:  2.0-3.0 on  05/26/2008 Previous INR:  2.7 on  02/25/2010 Previous Coumadin  Dose(mg):  7.5mg  qg on  02/25/2010 Previous Regimen:  7.5mg  qd on  02/25/2010 Previous Coagulation Comments:  Had been off for a week bcause of  a procedure started back about a week ago. on  11/27/2009  NEW REGIMEN & LAB RESULTS Current INR: 2.8 Regimen: 7.5mg  qd  (no change)       Repeat testing in: 4 weeks MEDICATIONS ALLOPURINOL 100 MG TABS (ALLOPURINOL) Take 1 tablet by mouth once a day COLACE 100 MG  CAPS (DOCUSATE SODIUM) Take 1 tablet by mouth two times a day FLOMAX 0.4 MG CP24 (TAMSULOSIN HCL) use 1 capsule by mouth once a day FUROSEMIDE 40 MG TABS (FUROSEMIDE) 2 pills every morning KLOR-CON M20 20 MEQ TBCR (POTASSIUM CHLORIDE CRYS CR) take 2 tabs by mouth twice daily REQUIP 4 MG  TABS (ROPINIROLE HCL) Take 1 tablet by mouth four times a day SINEMET CR 50-200 MG TBCR (CARBIDOPA-LEVODOPA) Take 1 four times a day COUMADIN 5 MG  TABS (WARFARIN SODIUM) as directed daily 1 1/2 at present (04/21/10) LOTRISONE 1-0.05 % CREA (CLOTRIMAZOLE-BETAMETHASONE) once a day to feet BASE A POLYETHYLENE GLYCOL   POWD (POLYETHYLENE GLYCOL 1450) 17 gm every other day mixed with water CRESTOR 10 MG  TABS (ROSUVASTATIN CALCIUM) once daily CYCLOBENZAPRINE HCL 10 MG  TABS (CYCLOBENZAPRINE HCL) three times a day ELDEPRYL 5 MG CAPS (SELEGILINE HCL) Take 1 tablet by mouth two times a day SANCTURA XR 60 MG XR24H-CAP (TROSPIUM CHLORIDE) one tab two times a day NITROFURANTOIN MACROCRYSTAL 100 MG CAPS (NITROFURANTOIN MACROCRYSTAL) Take 1 capsule by mouth at bedtime TRAMADOL HCL 50 MG TABS (TRAMADOL HCL) one tablet up to four times a day * VEMMA VITAMIN LIQUID--NO VITAMIN K 2 oz once daily MOUTH KOTE  SOLN (ARTIFICIAL SALIVA) use three times daily   Anticoagulation Visit Questionnaire      Coumadin dose missed/changed:  No      Abnormal Bleeding Symptoms:  No   Any diet changes including alcohol intake, vegetables or greens since the last visit:  No Any illnesses or hospitalizations since the last visit:   No Any signs of clotting since the last visit (including chest discomfort, dizziness, shortness of breath, arm tingling, slurred speech, swelling or redness in leg):  No

## 2011-01-13 NOTE — Assessment & Plan Note (Signed)
Summary: PT//CCM   Nurse Visit   Allergies: No Known Drug Allergies Laboratory Results   Blood Tests   Date/Time Received: February 25, 2010 10:16 AM  Date/Time Reported: February 25, 2010 10:15 AM   PT: 19.8 s   (Normal Range: 10.6-13.4)  INR: 2.7   (Normal Range: 0.88-1.12   Therap INR: 2.0-3.5) Comments: Wynona Canes, CMA  February 25, 2010 10:16 AM     Orders Added: 1)  Est. Patient Level I [99211] 2)  Protime [[id number]]  Laboratory Results   Blood Tests     PT: 19.8 s   (Normal Range: 10.6-13.4)  INR: 2.7   (Normal Range: 0.88-1.12   Therap INR: 2.0-3.5) Comments: Wynona Canes, CMA  February 25, 2010 10:16 AM       ANTICOAGULATION RECORD PREVIOUS REGIMEN & LAB RESULTS Anticoagulation Diagnosis:  v58.83,v58.61,415.19 on  10/08/2007 Previous INR Goal Range:  2.0-3.0 on  05/26/2008 Previous INR:  2.2 on  01/27/2010 Previous Coumadin Dose(mg):  7.5mg ,7.5mg ,5mg  on  06/11/2008 Previous Regimen:  same on  01/27/2010 Previous Coagulation Comments:  Had been off for a week bcause of a procedure started back about a week ago. on  11/27/2009  NEW REGIMEN & LAB RESULTS Current INR: 2.7 Current Coumadin Dose(mg): 7.5mg  qg Regimen: 7.5mg  qd       Repeat testing in: 4 weeks MEDICATIONS ALLOPURINOL 100 MG TABS (ALLOPURINOL) Take 1 tablet by mouth once a day COLACE 100 MG  CAPS (DOCUSATE SODIUM) Take 1 tablet by mouth two times a day FLOMAX 0.4 MG CP24 (TAMSULOSIN HCL) use 1 capsule by mouth once a day FUROSEMIDE 40 MG TABS (FUROSEMIDE) 2 pills every morning KLOR-CON M20 20 MEQ TBCR (POTASSIUM CHLORIDE CRYS CR) take 2 tabs by mouth twice daily REQUIP 4 MG  TABS (ROPINIROLE HCL) Take 1 tablet by mouth four times a day SINEMET CR 50-200 MG TBCR (CARBIDOPA-LEVODOPA) Take 1 four times a day COUMADIN 5 MG  TABS (WARFARIN SODIUM) as directed daily LOTRISONE 1-0.05 % CREA (CLOTRIMAZOLE-BETAMETHASONE) once a day to feet BASE A POLYETHYLENE GLYCOL   POWD (POLYETHYLENE GLYCOL  1450) 17 gm every other day mixed with water CRESTOR 10 MG  TABS (ROSUVASTATIN CALCIUM) once daily CYCLOBENZAPRINE HCL 10 MG  TABS (CYCLOBENZAPRINE HCL) three times a day ELDEPRYL 5 MG CAPS (SELEGILINE HCL) Take 1 tablet by mouth two times a day SANCTURA XR 60 MG XR24H-CAP (TROSPIUM CHLORIDE) one tab two times a day CEPHALEXIN 500 MG CAPS (CEPHALEXIN) one by mouth three times a day for 7 days   Anticoagulation Visit Questionnaire      Coumadin dose missed/changed:  No      Abnormal Bleeding Symptoms:  No   Any diet changes including alcohol intake, vegetables or greens since the last visit:  No Any illnesses or hospitalizations since the last visit:  No Any signs of clotting since the last visit (including chest discomfort, dizziness, shortness of breath, arm tingling, slurred speech, swelling or redness in leg):  No

## 2011-01-13 NOTE — Assessment & Plan Note (Signed)
Summary: pt/njr   Nurse Visit   Allergies: No Known Drug Allergies Laboratory Results   Blood Tests      INR: 2.3   (Normal Range: 0.88-1.12   Therap INR: 2.0-3.5) Comments: Rita Ohara  June 28, 2010 9:54 AM     Orders Added: 1)  Est. Patient Level I [99211] 2)  Protime [60454UJ]   ANTICOAGULATION RECORD PREVIOUS REGIMEN & LAB RESULTS Anticoagulation Diagnosis:  v58.83,v58.61,415.19 on  10/08/2007 Previous INR Goal Range:  2.0-3.0 on  05/26/2008 Previous INR:  2.1 on  06/02/2010 Previous Coumadin Dose(mg):  5mg  on mon, & thu 7.5mg  on other days on  06/02/2010 Previous Regimen:  7.5mg  qd 5mg  m,th on  05/18/2010 Previous Coagulation Comments:  Had been off for a week bcause of a procedure started back about a week ago. on  11/27/2009  NEW REGIMEN & LAB RESULTS Current INR: 2.3 Regimen: 7.5mg  qd 5mg  m,th  (no change)  Repeat testing in: same  Anticoagulation Visit Questionnaire Coumadin dose missed/changed:  No Abnormal Bleeding Symptoms:  No  Any diet changes including alcohol intake, vegetables or greens since the last visit:  No Any illnesses or hospitalizations since the last visit:  No Any signs of clotting since the last visit (including chest discomfort, dizziness, shortness of breath, arm tingling, slurred speech, swelling or redness in leg):  No  MEDICATIONS ALLOPURINOL 100 MG TABS (ALLOPURINOL) Take 1 tablet by mouth once a day COLACE 100 MG  CAPS (DOCUSATE SODIUM) Take 1 tablet by mouth two times a day FLOMAX 0.4 MG CP24 (TAMSULOSIN HCL) use 1 capsule by mouth once a day FUROSEMIDE 40 MG TABS (FUROSEMIDE) 2 pills every morning KLOR-CON M20 20 MEQ TBCR (POTASSIUM CHLORIDE CRYS CR) take 2 tabs by mouth twice daily REQUIP 4 MG  TABS (ROPINIROLE HCL) Take 1 tablet by mouth four times a day SINEMET CR 50-200 MG TBCR (CARBIDOPA-LEVODOPA) Take 1 four times a day COUMADIN 5 MG  TABS (WARFARIN SODIUM) as directed daily 1 1/2 at present (04/21/10) CRESTOR 10  MG  TABS (ROSUVASTATIN CALCIUM) once daily ELDEPRYL 5 MG CAPS (SELEGILINE HCL) Take 1 tablet by mouth two times a day SANCTURA XR 60 MG XR24H-CAP (TROSPIUM CHLORIDE) Take 1 tablet by mouth once a day NITROFURANTOIN MACROCRYSTAL 100 MG CAPS (NITROFURANTOIN MACROCRYSTAL) Take 1 capsule by mouth at bedtime TRAMADOL HCL 50 MG TABS (TRAMADOL HCL) one tablet up to four times a day

## 2011-01-13 NOTE — Assessment & Plan Note (Signed)
Summary: PT // RS   Nurse Visit   Allergies: No Known Drug Allergies Laboratory Results   Blood Tests      INR: 2.3   (Normal Range: 0.88-1.12   Therap INR: 2.0-3.5)    Orders Added: 1)  Est. Patient Level I [16109] 2)  Protime [[id number]]   ANTICOAGULATION RECORD PREVIOUS REGIMEN & LAB RESULTS Anticoagulation Diagnosis:  v58.83,v58.61,415.19 on  10/08/2007 Previous INR Goal Range:  2.0-3.0 on  05/26/2008 Previous INR:  2.4 on  09/23/2010 Previous Coumadin Dose(mg):  7.5mg  on mon & thu, 7mg  on other days on  09/23/2010 Previous Regimen:  Same Dose on  09/23/2010 Previous Coagulation Comments:  Had been off for a week bcause of a procedure started back about a week ago. on  11/27/2009  NEW REGIMEN & LAB RESULTS Current INR: 2.3 Regimen: same  Repeat testing in: 4 weeks  Anticoagulation Visit Questionnaire Coumadin dose missed/changed:  No Abnormal Bleeding Symptoms:  No  Any diet changes including alcohol intake, vegetables or greens since the last visit:  No Any illnesses or hospitalizations since the last visit:  No Any signs of clotting since the last visit (including chest discomfort, dizziness, shortness of breath, arm tingling, slurred speech, swelling or redness in leg):  No  MEDICATIONS ALLOPURINOL 100 MG TABS (ALLOPURINOL) Take 1 tablet by mouth once a day COLACE 100 MG  CAPS (DOCUSATE SODIUM) Take 1 tablet by mouth two times a day FLOMAX 0.4 MG CP24 (TAMSULOSIN HCL) use 1 capsule by mouth once a day FUROSEMIDE 40 MG TABS (FUROSEMIDE) 2 pills every morning KLOR-CON M20 20 MEQ TBCR (POTASSIUM CHLORIDE CRYS CR) take 2 tabs by mouth twice daily REQUIP 4 MG  TABS (ROPINIROLE HCL) Take 1 tablet by mouth four times a day SINEMET CR 50-200 MG TBCR (CARBIDOPA-LEVODOPA) Take 1 four times a day COUMADIN 5 MG  TABS (WARFARIN SODIUM) as directed daily 1 1/2 at present (04/21/10) CRESTOR 10 MG  TABS (ROSUVASTATIN CALCIUM) once daily ELDEPRYL 5 MG CAPS (SELEGILINE HCL)  Take 1 tablet by mouth two times a day SANCTURA XR 60 MG XR24H-CAP (TROSPIUM CHLORIDE) Take 1 tablet by mouth once a day NITROFURANTOIN MACROCRYSTAL 100 MG CAPS (NITROFURANTOIN MACROCRYSTAL) Take 1 capsule by mouth at bedtime GLYCOLAX  POWD (POLYETHYLENE GLYCOL 3350) as needed HYDROCODONE-ACETAMINOPHEN 10-325 MG TABS (HYDROCODONE-ACETAMINOPHEN) take one tab three times a day by mouth as needed for pain CLOTRIMAZOLE-BETAMETHASONE 1-0.05 % CREA (CLOTRIMAZOLE-BETAMETHASONE) apply to feet once daily

## 2011-01-13 NOTE — Assessment & Plan Note (Signed)
Summary: pt/mm   Nurse Visit   Allergies: No Known Drug Allergies Laboratory Results   Blood Tests     PT: 17.9 s   (Normal Range: 10.6-13.4)  INR: 2.1   (Normal Range: 0.88-1.12   Therap INR: 2.0-3.5) Comments: Rita Ohara  December 30, 2009 10:11 AM     Orders Added: 1)  Est. Patient Level I [99211] 2)  Protime [[id number]]    ANTICOAGULATION RECORD PREVIOUS REGIMEN & LAB RESULTS Anticoagulation Diagnosis:  v58.83,v58.61,415.19 on  10/08/2007 Previous INR Goal Range:  2.0-3.0 on  05/26/2008 Previous INR:  1.2 on  12/16/2009 Previous Coumadin Dose(mg):  7.5mg ,7.5mg ,5mg  on  06/11/2008 Previous Regimen:  7.5mg . QD on  12/16/2009 Previous Coagulation Comments:  Had been off for a week bcause of a procedure started back about a week ago. on  11/27/2009  NEW REGIMEN & LAB RESULTS Current INR: 2.1 Regimen: same  Repeat testing in: 1 month

## 2011-01-13 NOTE — Assessment & Plan Note (Signed)
Summary: pt/njr   Nurse Visit   Allergies: No Known Drug Allergies Laboratory Results   Blood Tests     PT: 13.8 s   (Normal Range: 10.6-13.4)  INR: 1.2   (Normal Range: 0.88-1.12   Therap INR: 2.0-3.5) Comments: Rita Ohara  December 16, 2009 9:48 AM     Orders Added: 1)  Est. Patient Level I [99211] 2)  Protime [[id number]]   ANTICOAGULATION RECORD PREVIOUS REGIMEN & LAB RESULTS Anticoagulation Diagnosis:  v58.83,v58.61,415.19 on  10/08/2007 Previous INR Goal Range:  2.0-3.0 on  05/26/2008 Previous INR:  1.7 on  11/27/2009 Previous Coumadin Dose(mg):  7.5mg ,7.5mg ,5mg  on  06/11/2008 Previous Regimen:  same on  11/27/2009 Previous Coagulation Comments:  Had been off for a week bcause of a procedure started back about a week ago. on  11/27/2009  NEW REGIMEN & LAB RESULTS Current INR: 1.2 Regimen: 7.5mg . QD  Repeat testing in: 2 weeks  Anticoagulation Visit Questionnaire Coumadin dose missed/changed:  Yes Coumadin Dose Comments:  one or more missed dose(s) Abnormal Bleeding Symptoms:  No  Any diet changes including alcohol intake, vegetables or greens since the last visit:  Yes      Diet Comments:More salads Any illnesses or hospitalizations since the last visit:  No Any signs of clotting since the last visit (including chest discomfort, dizziness, shortness of breath, arm tingling, slurred speech, swelling or redness in leg):  No  MEDICATIONS ALLOPURINOL 100 MG TABS (ALLOPURINOL) Take 1 tablet by mouth once a day COLACE 100 MG  CAPS (DOCUSATE SODIUM) Take 1 tablet by mouth two times a day FLOMAX 0.4 MG CP24 (TAMSULOSIN HCL) use 1 capsule by mouth once a day FUROSEMIDE 40 MG TABS (FUROSEMIDE) 2 pills every morning KLOR-CON M20 20 MEQ TBCR (POTASSIUM CHLORIDE CRYS CR) take 2 tabs by mouth twice daily REQUIP 4 MG  TABS (ROPINIROLE HCL) Take 1 tablet by mouth four times a day SINEMET CR 50-200 MG TBCR (CARBIDOPA-LEVODOPA) Take 1 four times a day COUMADIN 5 MG   TABS (WARFARIN SODIUM) as directed daily LOTRISONE 1-0.05 % CREA (CLOTRIMAZOLE-BETAMETHASONE) once a day to feet BASE A POLYETHYLENE GLYCOL   POWD (POLYETHYLENE GLYCOL 1450) 17 gm every other day mixed with water CRESTOR 10 MG  TABS (ROSUVASTATIN CALCIUM) once daily CYCLOBENZAPRINE HCL 10 MG  TABS (CYCLOBENZAPRINE HCL) three times a day ELDEPRYL 5 MG CAPS (SELEGILINE HCL) Take 1 tablet by mouth two times a day SANCTURA XR 60 MG XR24H-CAP (TROSPIUM CHLORIDE) one tab two times a day CEPHALEXIN 500 MG CAPS (CEPHALEXIN) one by mouth three times a day for 7 days

## 2011-01-13 NOTE — Assessment & Plan Note (Signed)
Summary: pt//ccm   Nurse Visit   Allergies: No Known Drug Allergies Laboratory Results   Blood Tests   Date/Time Received: September 23, 2010 10:03 AM  Date/Time Reported: September 23, 2010 10:03 AM    INR: 2.4   (Normal Range: 0.88-1.12   Therap INR: 2.0-3.5) Comments: Wynona Canes, CMA  September 23, 2010 10:03 AM     Orders Added: 1)  Est. Patient Level I [99211] 2)  Protime [[id number]]  Laboratory Results   Blood Tests      INR: 2.4   (Normal Range: 0.88-1.12   Therap INR: 2.0-3.5) Comments: Wynona Canes, CMA  September 23, 2010 10:03 AM       ANTICOAGULATION RECORD PREVIOUS REGIMEN & LAB RESULTS Anticoagulation Diagnosis:  v58.83,v58.61,415.19 on  10/08/2007 Previous INR Goal Range:  2.0-3.0 on  05/26/2008 Previous INR:  2.8 on  08/26/2010 Previous Coumadin Dose(mg):  5mg  on mon, & thu 7.5mg  on other days on  06/02/2010 Previous Regimen:  7.5mg . M, TH 7mg . other days this is what the patient has been taking on  08/26/2010 Previous Coagulation Comments:  Had been off for a week bcause of a procedure started back about a week ago. on  11/27/2009  NEW REGIMEN & LAB RESULTS Current INR: 2.4 Current Coumadin Dose(mg): 7.5mg  on mon & thu, 7mg  on other days Regimen: Same Dose       Repeat testing in: 4 weeks MEDICATIONS ALLOPURINOL 100 MG TABS (ALLOPURINOL) Take 1 tablet by mouth once a day COLACE 100 MG  CAPS (DOCUSATE SODIUM) Take 1 tablet by mouth two times a day FLOMAX 0.4 MG CP24 (TAMSULOSIN HCL) use 1 capsule by mouth once a day FUROSEMIDE 40 MG TABS (FUROSEMIDE) 2 pills every morning KLOR-CON M20 20 MEQ TBCR (POTASSIUM CHLORIDE CRYS CR) take 2 tabs by mouth twice daily REQUIP 4 MG  TABS (ROPINIROLE HCL) Take 1 tablet by mouth four times a day SINEMET CR 50-200 MG TBCR (CARBIDOPA-LEVODOPA) Take 1 four times a day COUMADIN 5 MG  TABS (WARFARIN SODIUM) as directed daily 1 1/2 at present (04/21/10) CRESTOR 10 MG  TABS (ROSUVASTATIN CALCIUM) once  daily ELDEPRYL 5 MG CAPS (SELEGILINE HCL) Take 1 tablet by mouth two times a day SANCTURA XR 60 MG XR24H-CAP (TROSPIUM CHLORIDE) Take 1 tablet by mouth once a day NITROFURANTOIN MACROCRYSTAL 100 MG CAPS (NITROFURANTOIN MACROCRYSTAL) Take 1 capsule by mouth at bedtime GLYCOLAX  POWD (POLYETHYLENE GLYCOL 3350) as needed HYDROCODONE-ACETAMINOPHEN 10-325 MG TABS (HYDROCODONE-ACETAMINOPHEN) take one tab three times a day by mouth as needed for pain   Anticoagulation Visit Questionnaire      Coumadin dose missed/changed:  No      Abnormal Bleeding Symptoms:  No   Any diet changes including alcohol intake, vegetables or greens since the last visit:  No Any illnesses or hospitalizations since the last visit:  No Any signs of clotting since the last visit (including chest discomfort, dizziness, shortness of breath, arm tingling, slurred speech, swelling or redness in leg):  No

## 2011-01-13 NOTE — Miscellaneous (Signed)
Summary: Office Visit ([facility])    Vital Signs:  Patient profile:   77 year old male Weight:      243 pounds Temp:     98.6 degrees F 98.6 Pulse rate:   80 / minute Pulse rhythm:   regular Resp:     16 per minute BP sitting:   122 / 70  Vitals Entered By: Lynann Beaver CMA AAMA (October 07, 2010 8:35 AM) CC: rov Pain Assessment Patient in pain? no        CC:  rov.  History of Present Illness:  Follow-Up Visit      This is a 77 year old man who presents for Follow-up visit.  The patient denies chest pain and palpitations.  Since the last visit the patient notes no new problems or concerns.  The patient reports taking meds as prescribed.  When questioned about possible medication side effects, the patient notes none.  he remains active with choral groups and he continues to travel but admits to fatigue  edema--much improed  Current Problems (verified): 1)  Encounter For Therapeutic Drug Monitoring  (ICD-V58.83) 2)  Coumadin Therapy  (ICD-V58.61) 3)  Gait Disturbance  (ICD-781.2) 4)  Edema  (ICD-782.3) 5)  Dvt, Hx of  (ICD-V12.51) 6)  Coumadin Therapy  (ICD-V58.61) 7)  Pulmonary Embolism  (ICD-415.19) 8)  Osteoarthritis  (ICD-715.90) 9)  Hypertension  (ICD-401.9) 10)  Hyperlipidemia  (ICD-272.4) 11)  Gout  (ICD-274.9) 12)  Degenerative Joint Disease, Right Hip  (ICD-715.95) 13)  Rbbb  (ICD-426.4) 14)  Diastolic Dysfunction  (ICD-429.9) 15)  Parkinson's Disease  (ICD-332.0)  Current Medications (verified): 1)  Allopurinol 100 Mg Tabs (Allopurinol) .... Take 1 Tablet By Mouth Once A Day 2)  Colace 100 Mg  Caps (Docusate Sodium) .... Take 1 Tablet By Mouth Two Times A Day 3)  Flomax 0.4 Mg Cp24 (Tamsulosin Hcl) .... Use 1 Capsule By Mouth Once A Day 4)  Furosemide 40 Mg Tabs (Furosemide) .... 2 Pills Every Morning 5)  Klor-Con M20 20 Meq Tbcr (Potassium Chloride Crys Cr) .... Take 2 Tabs By Mouth Twice Daily 6)  Requip 4 Mg  Tabs (Ropinirole Hcl) .... Take 1  Tablet By Mouth Four Times A Day 7)  Sinemet Cr 50-200 Mg Tbcr (Carbidopa-Levodopa) .... Take 1 Four Times A Day 8)  Coumadin 5 Mg  Tabs (Warfarin Sodium) .... As Directed Daily 1 1/2 At Present (04/21/10) 9)  Crestor 10 Mg  Tabs (Rosuvastatin Calcium) .... Once Daily 10)  Eldepryl 5 Mg Caps (Selegiline Hcl) .... Take 1 Tablet By Mouth Two Times A Day 11)  Sanctura Xr 60 Mg Xr24h-Cap (Trospium Chloride) .... Take 1 Tablet By Mouth Once A Day 12)  Nitrofurantoin Macrocrystal 100 Mg Caps (Nitrofurantoin Macrocrystal) .... Take 1 Capsule By Mouth At Bedtime 13)  Glycolax  Powd (Polyethylene Glycol 3350) .... As Needed 14)  Hydrocodone-Acetaminophen 10-325 Mg Tabs (Hydrocodone-Acetaminophen) .... Take One Tab Three Times A Day By Mouth As Needed For Pain  Allergies (verified): No Known Drug Allergies    Physical Exam  General:  alert and well-developed.   Head:  normocephalic and atraumatic.   Eyes:  pupils equal and pupils round.   Ears:  R ear normal and L ear normal.   Neck:  No deformities, masses, or tenderness noted. Lungs:  normal respiratory effort and no intercostal retractions.  normal respiratory effort and no intercostal retractions.   Heart:  normal rate and regular rhythm.   Abdomen:  soft and non-tender.  Msk:  No deformity or scoliosis noted of thoracic or lumbar spine.   Neurologic:  cranial nerves II-XII intact.  walks with cane significant leaning to right.    Impression & Recommendations:  Problem # 1:  EDEMA (ICD-782.3) Assessment Improved continue stockings I suspect weight loss has to do with decreased edema  His updated medication list for this problem includes:    Furosemide 40 Mg Tabs (Furosemide) .Marland Kitchen... 2 pills every morning  Problem # 2:  HYPERTENSION (ICD-401.9) controlled continue current medications  His updated medication list for this problem includes:    Furosemide 40 Mg Tabs (Furosemide) .Marland Kitchen... 2 pills every morning  BP today: 122/70 Prior  BP: 142/82 (08/26/2010)  Labs Reviewed: K+: 3.8 (08/26/2010) Creat: : 0.9 (08/26/2010)   Chol: 99 (04/21/2010)   HDL: 34.70 (04/21/2010)   LDL: 33 (04/21/2010)   TG: 159.0 (04/21/2010)  Problem # 3:  GOUT (ICD-274.9) no recurrence His updated medication list for this problem includes:    Allopurinol 100 Mg Tabs (Allopurinol) .Marland Kitchen... Take 1 tablet by mouth once a day  Problem # 4:  PARKINSON'S DISEASE (ICD-332.0) followed by urology  Complete Medication List: 1)  Allopurinol 100 Mg Tabs (Allopurinol) .... Take 1 tablet by mouth once a day 2)  Colace 100 Mg Caps (Docusate sodium) .... Take 1 tablet by mouth two times a day 3)  Flomax 0.4 Mg Cp24 (Tamsulosin hcl) .... Use 1 capsule by mouth once a day 4)  Furosemide 40 Mg Tabs (Furosemide) .... 2 pills every morning 5)  Klor-con M20 20 Meq Tbcr (Potassium chloride crys cr) .... Take 2 tabs by mouth twice daily 6)  Requip 4 Mg Tabs (Ropinirole hcl) .... Take 1 tablet by mouth four times a day 7)  Sinemet Cr 50-200 Mg Tbcr (Carbidopa-levodopa) .... Take 1 four times a day 8)  Coumadin 5 Mg Tabs (Warfarin sodium) .... As directed daily 1 1/2 at present (04/21/10) 9)  Crestor 10 Mg Tabs (Rosuvastatin calcium) .... Once daily 10)  Eldepryl 5 Mg Caps (Selegiline hcl) .... Take 1 tablet by mouth two times a day 11)  Sanctura Xr 60 Mg Xr24h-cap (Trospium chloride) .... Take 1 tablet by mouth once a day 12)  Nitrofurantoin Macrocrystal 100 Mg Caps (Nitrofurantoin macrocrystal) .... Take 1 capsule by mouth at bedtime 13)  Glycolax Powd (Polyethylene glycol 3350) .... As needed 14)  Hydrocodone-acetaminophen 10-325 Mg Tabs (Hydrocodone-acetaminophen) .... Take one tab three times a day by mouth as needed for pain   Patient Instructions: 1)  Please schedule a follow-up appointment in 3 months. ]

## 2011-01-13 NOTE — Assessment & Plan Note (Signed)
Summary: ?constipation/njr   Vital Signs:  Patient profile:   77 year old male Weight:      237 pounds Temp:     97.5 degrees F oral BP sitting:   130 / 70  (left arm)  Vitals Entered By: Kathrynn Speed CMA (May 14, 2010 11:58 AM) CC: constipation   CC:  constipation.  History of Present Illness: Has had periods of constipation has daily bms but feels like not "enough" is not coming out. pst few days has had several "mushy" bms daily.  Stopped glycolax becasue BMs too frequent  no abdominal pain appetite normal  parkinsnons: sxs stable---has a tendency to lean to the right  Current Medications (verified): 1)  Allopurinol 100 Mg Tabs (Allopurinol) .... Take 1 Tablet By Mouth Once A Day 2)  Colace 100 Mg  Caps (Docusate Sodium) .... Take 1 Tablet By Mouth Two Times A Day 3)  Flomax 0.4 Mg Cp24 (Tamsulosin Hcl) .... Use 1 Capsule By Mouth Once A Day 4)  Furosemide 40 Mg Tabs (Furosemide) .... 2 Pills Every Morning 5)  Klor-Con M20 20 Meq Tbcr (Potassium Chloride Crys Cr) .... Take 2 Tabs By Mouth Twice Daily 6)  Requip 4 Mg  Tabs (Ropinirole Hcl) .... Take 1 Tablet By Mouth Four Times A Day 7)  Sinemet Cr 50-200 Mg Tbcr (Carbidopa-Levodopa) .... Take 1 Four Times A Day 8)  Coumadin 5 Mg  Tabs (Warfarin Sodium) .... As Directed Daily 1 1/2 At Present (04/21/10) 9)  Lotrisone 1-0.05 % Crea (Clotrimazole-Betamethasone) .... Once A Day To Feet 10)  Crestor 10 Mg  Tabs (Rosuvastatin Calcium) .... Once Daily 11)  Cyclobenzaprine Hcl 10 Mg  Tabs (Cyclobenzaprine Hcl) .... Three Times A Day 12)  Eldepryl 5 Mg Caps (Selegiline Hcl) .... Take 1 Tablet By Mouth Two Times A Day 13)  Sanctura Xr 60 Mg Xr24h-Cap (Trospium Chloride) .... One Tab Two Times A Day 14)  Nitrofurantoin Macrocrystal 100 Mg Caps (Nitrofurantoin Macrocrystal) .... Take 1 Capsule By Mouth At Bedtime 15)  Tramadol Hcl 50 Mg Tabs (Tramadol Hcl) .... One Tablet Up To Four Times A Day 16)  Mouth Kote  Soln (Artificial  Saliva) .... Use Three Times Daily  Allergies (verified): No Known Drug Allergies   Impression & Recommendations:  Problem # 1:  CONSTIPATION (ICD-564.00)  likely multifactoria suspect meds are primary problem decrease sanctura to once daily  see new med list side effects of stopping meds discussed His updated medication list for this problem includes:    Colace 100 Mg Caps (Docusate sodium) .Marland Kitchen... Take 1 tablet by mouth two times a day  Discussed dietary fiber measures and increased water intake.   Problem # 2:  PARKINSON'S DISEASE (ICD-332.0) clinically stable has f/u neurolgoy  Complete Medication List: 1)  Allopurinol 100 Mg Tabs (Allopurinol) .... Take 1 tablet by mouth once a day 2)  Colace 100 Mg Caps (Docusate sodium) .... Take 1 tablet by mouth two times a day 3)  Flomax 0.4 Mg Cp24 (Tamsulosin hcl) .... Use 1 capsule by mouth once a day 4)  Furosemide 40 Mg Tabs (Furosemide) .... 2 pills every morning 5)  Klor-con M20 20 Meq Tbcr (Potassium chloride crys cr) .... Take 2 tabs by mouth twice daily 6)  Requip 4 Mg Tabs (Ropinirole hcl) .... Take 1 tablet by mouth four times a day 7)  Sinemet Cr 50-200 Mg Tbcr (Carbidopa-levodopa) .... Take 1 four times a day 8)  Coumadin 5 Mg Tabs (Warfarin sodium) .Marland KitchenMarland KitchenMarland Kitchen  As directed daily 1 1/2 at present (04/21/10) 9)  Crestor 10 Mg Tabs (Rosuvastatin calcium) .... Once daily 10)  Eldepryl 5 Mg Caps (Selegiline hcl) .... Take 1 tablet by mouth two times a day 11)  Sanctura Xr 60 Mg Xr24h-cap (Trospium chloride) .... Take 1 tablet by mouth once a day 12)  Nitrofurantoin Macrocrystal 100 Mg Caps (Nitrofurantoin macrocrystal) .... Take 1 capsule by mouth at bedtime 13)  Tramadol Hcl 50 Mg Tabs (Tramadol hcl) .... One tablet up to four times a day 14)  Mouth Kote Soln (Artificial saliva) .... Use three times daily

## 2011-01-13 NOTE — Assessment & Plan Note (Signed)
Summary: 2-DAY ROV // RS   Vital Signs:  Patient profile:   77 year old male Weight:      252 pounds Temp:     98.0 degrees F oral BP sitting:   150 / 80  (left arm) Cuff size:   large  Vitals Entered By: Sid Falcon LPN (May 21, 2010 10:54 AM) CC: 2 day follow-up   History of Present Illness: Follow up severe constipation with impaction 2 days ago. We removed signif feces manually and pt tried enema that night without much improvement. Used miralax but only one dose.  Had BM yesterday but still feels some abd discomfort and difficulty expelling stool today.  No nausea or vomiting.  No fever.  Allergies (verified): No Known Drug Allergies  Past History:  Past Medical History: Last updated: 11/19/2007 sleep apnea RBBB parkinsons impaired LV relaxation Gout Hyperlipidemia Hypertension Osteoarthritis PE 2008 (september).  DVT, hx of  Past Surgical History: Last updated: 09/26/2007 Appendectomy Cholecystectomy dupruytens contracture Total hip replacement 9/8/8  Review of Systems  The patient denies fever, chest pain, melena, hematochezia, and severe indigestion/heartburn.    Physical Exam  General:  Well-developed,well-nourished,in no acute distress; alert,appropriate and cooperative throughout examination Abdomen:  soft and non-tender.   Rectal:  still has signif stool in rectal vault.  Manually removed what we could.     Impression & Recommendations:  Problem # 1:  CONSTIPATION (ICD-564.00) Will give regimen for miralax use at home.  Recent disimpaction. Cont stool softener and plenty of fluids. His updated medication list for this problem includes:    Colace 100 Mg Caps (Docusate sodium) .Marland Kitchen... Take 1 tablet by mouth two times a day  Complete Medication List: 1)  Allopurinol 100 Mg Tabs (Allopurinol) .... Take 1 tablet by mouth once a day 2)  Colace 100 Mg Caps (Docusate sodium) .... Take 1 tablet by mouth two times a day 3)  Flomax 0.4 Mg Cp24  (Tamsulosin hcl) .... Use 1 capsule by mouth once a day 4)  Furosemide 40 Mg Tabs (Furosemide) .... 2 pills every morning 5)  Klor-con M20 20 Meq Tbcr (Potassium chloride crys cr) .... Take 2 tabs by mouth twice daily 6)  Requip 4 Mg Tabs (Ropinirole hcl) .... Take 1 tablet by mouth four times a day 7)  Sinemet Cr 50-200 Mg Tbcr (Carbidopa-levodopa) .... Take 1 four times a day 8)  Coumadin 5 Mg Tabs (Warfarin sodium) .... As directed daily 1 1/2 at present (04/21/10) 9)  Crestor 10 Mg Tabs (Rosuvastatin calcium) .... Once daily 10)  Eldepryl 5 Mg Caps (Selegiline hcl) .... Take 1 tablet by mouth two times a day 11)  Sanctura Xr 60 Mg Xr24h-cap (Trospium chloride) .... Take 1 tablet by mouth once a day 12)  Nitrofurantoin Macrocrystal 100 Mg Caps (Nitrofurantoin macrocrystal) .... Take 1 capsule by mouth at bedtime 13)  Tramadol Hcl 50 Mg Tabs (Tramadol hcl) .... One tablet up to four times a day 14)  Anusol-hc 25 Mg Supp (Hydrocortisone acetate) .... Insert into rectum two times a day for 7 days  Patient Instructions: 1)  Miralax one dose every 2 hours up to 6 doses or until 2 to 3 normal bowel movements and then every 48 hours.

## 2011-01-13 NOTE — Assessment & Plan Note (Signed)
Summary: PT // RS rsc appt time/njr   Nurse Visit   Allergies: No Known Drug Allergies Laboratory Results   Blood Tests      INR: 2.6   (Normal Range: 0.88-1.12   Therap INR: 2.0-3.5) Comments: Walter Gill  November 24, 2010 10:12 AM     Orders Added: 1)  Est. Patient Level I [99211] 2)  Protime [[id number]]   ANTICOAGULATION RECORD PREVIOUS REGIMEN & LAB RESULTS Anticoagulation Diagnosis:  v58.83,v58.61,415.19 on  10/08/2007 Previous INR Goal Range:  2.0-3.0 on  05/26/2008 Previous INR:  2.3 on  10/28/2010 Previous Coumadin Dose(mg):  7.5mg  on mon & thu, 7mg  on other days on  09/23/2010 Previous Regimen:  same on  10/28/2010 Previous Coagulation Comments:  Had been off for a week bcause of a procedure started back about a week ago. on  11/27/2009  NEW REGIMEN & LAB RESULTS Current INR: 2.6 Regimen: same  Repeat testing in: 4 weeks  Anticoagulation Visit Questionnaire Coumadin dose missed/changed:  No Abnormal Bleeding Symptoms:  No  Any diet changes including alcohol intake, vegetables or greens since the last visit:  No Any illnesses or hospitalizations since the last visit:  No Any signs of clotting since the last visit (including chest discomfort, dizziness, shortness of breath, arm tingling, slurred speech, swelling or redness in leg):  No  MEDICATIONS ALLOPURINOL 100 MG TABS (ALLOPURINOL) Take 1 tablet by mouth once a day COLACE 100 MG  CAPS (DOCUSATE SODIUM) Take 1 tablet by mouth two times a day FLOMAX 0.4 MG CP24 (TAMSULOSIN HCL) use 1 capsule by mouth once a day FUROSEMIDE 40 MG TABS (FUROSEMIDE) 2 pills every morning KLOR-CON M20 20 MEQ TBCR (POTASSIUM CHLORIDE CRYS CR) take 2 tabs by mouth twice daily REQUIP 4 MG  TABS (ROPINIROLE HCL) Take 1 tablet by mouth four times a day SINEMET CR 50-200 MG TBCR (CARBIDOPA-LEVODOPA) Take 1 four times a day COUMADIN 5 MG  TABS (WARFARIN SODIUM) as directed daily 1 1/2 at present (04/21/10) CRESTOR 10 MG  TABS  (ROSUVASTATIN CALCIUM) once daily ELDEPRYL 5 MG CAPS (SELEGILINE HCL) Take 1 tablet by mouth two times a day SANCTURA XR 60 MG XR24H-CAP (TROSPIUM CHLORIDE) Take 1 tablet by mouth once a day NITROFURANTOIN MACROCRYSTAL 100 MG CAPS (NITROFURANTOIN MACROCRYSTAL) Take 1 capsule by mouth at bedtime GLYCOLAX  POWD (POLYETHYLENE GLYCOL 3350) as needed HYDROCODONE-ACETAMINOPHEN 10-325 MG TABS (HYDROCODONE-ACETAMINOPHEN) take one tab three times a day by mouth as needed for pain CLOTRIMAZOLE-BETAMETHASONE 1-0.05 % CREA (CLOTRIMAZOLE-BETAMETHASONE) apply to feet once daily

## 2011-01-13 NOTE — Progress Notes (Signed)
Summary: diarrhea   Phone Note Call from Patient   Caller: Patient Call For: Birdie Sons MD Summary of Call: Pt has been having loose stools (q hour) x 24 hours, and is having extreme burning in rectum.  Asking for RX.  No fever, vomiting, etc. [phone] [facility]) Initial call taken by: Lynann Beaver CMA,  May 04, 2010 8:00 AM  Follow-up for Phone Call        can try otc immodium up to 3 daily for 3 days can use otc preparation H Follow-up by: Birdie Sons MD,  May 03, 2010 4:55 PM  Additional Follow-up for Phone Call Additional follow up Details #1::        Pt given Dr. Marliss Coots recommendatons. Additional Follow-up by: Lynann Beaver CMA,  May 04, 2010 8:09 AM

## 2011-01-13 NOTE — Progress Notes (Signed)
Summary: Bowel Problem  Phone Note Call from Patient Call back at Home Phone [phone]   Caller: Patient Summary of Call: Having bowel problems, started Sunday night.  Severe urge to have a bowel  movement with burning sensation but nothing is coming out.  Is there something that can be called in? Initial call taken by: Trixie Dredge,  April 06, 2010 10:21 AM  Follow-up for Phone Call        When I spoke to pt's wife, she states he is much better. Follow-up by: Lynann Beaver CMA,  April 06, 2010 12:18 PM

## 2011-01-13 NOTE — Letter (Signed)
Summary: [facility]  [facility]   Imported By: Maryln Gottron 04/15/2010 15:42:35  _____________________________________________________________________  External Attachment:    Type:   Image     Comment:   External Document

## 2011-01-13 NOTE — Assessment & Plan Note (Signed)
Summary: SWELLING FEET & LEGS/PS   Vitals Entered By: Lynann Beaver CMA (September 07, 2010 11:46 AM)  History of Present Illness:  Follow-Up Visit      This is a 77 year old man who presents for Follow-up visit.  The patient denies chest pain and palpitations.  Since the last visit the patient notes no new problems or concerns and being seen by a specialist (neurology).  The patient reports taking meds as prescribed.  When questioned about possible medication side effects, the patient notes none.    All other systems reviewed and were negative   Allergies: No Known Drug Allergies  Past History:  Past Medical History: Last updated: 11/19/2007 sleep apnea RBBB parkinsons impaired LV relaxation Gout Hyperlipidemia Hypertension Osteoarthritis PE 2008 (september).  DVT, hx of  Past Surgical History: Last updated: 09/26/2007 Appendectomy Cholecystectomy dupruytens contracture Total hip replacement 9/8/8  Family History: Last updated: 07/03/2007 Mother deceased--MI-82 yo Father deceased--cancer unknown type (GI)-70  Social History: Last updated: 03-Jul-2007 Retired Married Never Smoked  Risk Factors: Smoking Status: never (05/18/2010)  Physical Exam  General:  alert and well-developed.  alert and well-developed.   Head:  normocephalic and atraumatic.  normocephalic and atraumatic.   Eyes:  pupils equal and pupils round.  pupils equal and pupils round.   Neck:  No deformities, masses, or tenderness noted. Chest Wall:  No deformities, masses, tenderness or gynecomastia noted. Lungs:  normal respiratory effort and no intercostal retractions.  normal respiratory effort and no intercostal retractions.   Abdomen:  soft and non-tender.   Msk:  leans to right Extremities:  2+ left pedal edema and 2+ right pedal edema.     Impression & Recommendations:  Problem # 1:  EDEMA (ICD-782.3) stable continue current medications  continue compressive stockings His updated  medication list for this problem includes:    Furosemide 40 Mg Tabs (Furosemide) .Marland Kitchen... 2 pills every morning  Complete Medication List: 1)  Allopurinol 100 Mg Tabs (Allopurinol) .... Take 1 tablet by mouth once a day 2)  Colace 100 Mg Caps (Docusate sodium) .... Take 1 tablet by mouth two times a day 3)  Flomax 0.4 Mg Cp24 (Tamsulosin hcl) .... Use 1 capsule by mouth once a day 4)  Furosemide 40 Mg Tabs (Furosemide) .... 2 pills every morning 5)  Klor-con M20 20 Meq Tbcr (Potassium chloride crys cr) .... Take 2 tabs by mouth twice daily 6)  Requip 4 Mg Tabs (Ropinirole hcl) .... Take 1 tablet by mouth four times a day 7)  Sinemet Cr 50-200 Mg Tbcr (Carbidopa-levodopa) .... Take 1 four times a day 8)  Coumadin 5 Mg Tabs (Warfarin sodium) .... As directed daily 1 1/2 at present (04/21/10) 9)  Crestor 10 Mg Tabs (Rosuvastatin calcium) .... Once daily 10)  Eldepryl 5 Mg Caps (Selegiline hcl) .... Take 1 tablet by mouth two times a day 11)  Sanctura Xr 60 Mg Xr24h-cap (Trospium chloride) .... Take 1 tablet by mouth once a day 12)  Nitrofurantoin Macrocrystal 100 Mg Caps (Nitrofurantoin macrocrystal) .... Take 1 capsule by mouth at bedtime 13)  Glycolax Powd (Polyethylene glycol 3350) .... As needed 14)  Hydrocodone-acetaminophen 10-325 Mg Tabs (Hydrocodone-acetaminophen) .... Take one tab three times a day by mouth as needed for pain

## 2011-01-13 NOTE — Assessment & Plan Note (Signed)
Summary: abdomen pain/dm   Vital Signs:  Patient profile:   77 year old male Weight:      237 pounds Temp:     98.2 degrees F oral BP sitting:   140 / 80  (left arm) CC: rectal pain   History of Present Illness: Rectal pain for over one week. No relief with Anusol.  Refer to prev note. Symptoms progressive.  Has urge to defecate but unable to. Mild diffuse lower abd discomfort but mostly perirectal pain.  No blood with stools.  Unable to pass much stool past several days.  Hesitation for bowel movements sec to pain. Some constipation.  Parkinson's Dis on multiple meds.  Current Medications (verified): 1)  Allopurinol 100 Mg Tabs (Allopurinol) .... Take 1 Tablet By Mouth Once A Day 2)  Colace 100 Mg  Caps (Docusate Sodium) .... Take 1 Tablet By Mouth Two Times A Day 3)  Flomax 0.4 Mg Cp24 (Tamsulosin Hcl) .... Use 1 Capsule By Mouth Once A Day 4)  Furosemide 40 Mg Tabs (Furosemide) .... 2 Pills Every Morning 5)  Klor-Con M20 20 Meq Tbcr (Potassium Chloride Crys Cr) .... Take 2 Tabs By Mouth Twice Daily 6)  Requip 4 Mg  Tabs (Ropinirole Hcl) .... Take 1 Tablet By Mouth Four Times A Day 7)  Sinemet Cr 50-200 Mg Tbcr (Carbidopa-Levodopa) .... Take 1 Four Times A Day 8)  Coumadin 5 Mg  Tabs (Warfarin Sodium) .... As Directed Daily 1 1/2 At Present (04/21/10) 9)  Crestor 10 Mg  Tabs (Rosuvastatin Calcium) .... Once Daily 10)  Eldepryl 5 Mg Caps (Selegiline Hcl) .... Take 1 Tablet By Mouth Two Times A Day 11)  Sanctura Xr 60 Mg Xr24h-Cap (Trospium Chloride) .... Take 1 Tablet By Mouth Once A Day 12)  Nitrofurantoin Macrocrystal 100 Mg Caps (Nitrofurantoin Macrocrystal) .... Take 1 Capsule By Mouth At Bedtime 13)  Tramadol Hcl 50 Mg Tabs (Tramadol Hcl) .... One Tablet Up To Four Times A Day 14)  Anusol-Hc 25 Mg Supp (Hydrocortisone Acetate) .... Insert Into Rectum Two Times A Day For 7 Days  Allergies (verified): No Known Drug Allergies  Past History:  Past Medical History: Last  updated: 11/19/2007 sleep apnea RBBB parkinsons impaired LV relaxation Gout Hyperlipidemia Hypertension Osteoarthritis PE 2008 (september).  DVT, hx of  Past Surgical History: Last updated: 09/26/2007 Appendectomy Cholecystectomy dupruytens contracture Total hip replacement 9/8/8  Social History: Last updated: 07/02/2007 Retired Married Never Smoked PMH reviewed for relevance, SH/Risk Factors reviewed for relevance  Review of Systems       some stool incontinence noted.  Physical Exam  General:  Well-developed,well-nourished,in no acute distress; alert,appropriate and cooperative throughout examination Lungs:  Normal respiratory effort, chest expands symmetrically. Lungs are clear to auscultation, no crackles or wheezes. Heart:  normal rate and regular rhythm.   Abdomen:  soft, non-tender, normal bowel sounds, no hepatomegaly, and no splenomegaly.   Rectal:  stool impaction otherwise no mass.  No hemorrhoids. Neurologic:  alert & oriented X3.     Impression & Recommendations:  Problem # 1:  FECAL IMPACTION (ICD-560.32) Assessment New manually disimpacted.  Pt has some symptom relief afterward.  no other masses noted.  On several meds which could be contributing.  Still has signif stool in rectum unable to evacuate manually.  He will cont stool softener, increase fluids and fiber, and try enema tonight.  Problem # 2:  CONSTIPATION (ICD-564.00) discussed measures to reduce at length. His updated medication list for this problem includes:  Colace 100 Mg Caps (Docusate sodium) .Marland Kitchen... Take 1 tablet by mouth two times a day  Complete Medication List: 1)  Allopurinol 100 Mg Tabs (Allopurinol) .... Take 1 tablet by mouth once a day 2)  Colace 100 Mg Caps (Docusate sodium) .... Take 1 tablet by mouth two times a day 3)  Flomax 0.4 Mg Cp24 (Tamsulosin hcl) .... Use 1 capsule by mouth once a day 4)  Furosemide 40 Mg Tabs (Furosemide) .... 2 pills every morning 5)   Klor-con M20 20 Meq Tbcr (Potassium chloride crys cr) .... Take 2 tabs by mouth twice daily 6)  Requip 4 Mg Tabs (Ropinirole hcl) .... Take 1 tablet by mouth four times a day 7)  Sinemet Cr 50-200 Mg Tbcr (Carbidopa-levodopa) .... Take 1 four times a day 8)  Coumadin 5 Mg Tabs (Warfarin sodium) .... As directed daily 1 1/2 at present (04/21/10) 9)  Crestor 10 Mg Tabs (Rosuvastatin calcium) .... Once daily 10)  Eldepryl 5 Mg Caps (Selegiline hcl) .... Take 1 tablet by mouth two times a day 11)  Sanctura Xr 60 Mg Xr24h-cap (Trospium chloride) .... Take 1 tablet by mouth once a day 12)  Nitrofurantoin Macrocrystal 100 Mg Caps (Nitrofurantoin macrocrystal) .... Take 1 capsule by mouth at bedtime 13)  Tramadol Hcl 50 Mg Tabs (Tramadol hcl) .... One tablet up to four times a day 14)  Anusol-hc 25 Mg Supp (Hydrocortisone acetate) .... Insert into rectum two times a day for 7 days  Patient Instructions: 1)  drink plenty of fliuds. 2)  continue stool softener. 3)  Try fleets enema 4)  Eat plenty of fiber. 5)  Schedule follow up in 2 days to reassess

## 2011-01-14 NOTE — Letter (Signed)
Summary: [facility]  [facility]   Imported By: Sherian Rein 08/20/2010 09:38:15  _____________________________________________________________________  External Attachment:    Type:   Image     Comment:   External Document

## 2011-01-14 NOTE — Letter (Signed)
Summary: [facility]  [facility]   Imported By: Lester Almena 01/18/2010 09:59:33  _____________________________________________________________________  External Attachment:    Type:   Image     Comment:   External Document

## 2011-01-19 ENCOUNTER — Other Ambulatory Visit (INDEPENDENT_AMBULATORY_CARE_PROVIDER_SITE_OTHER): Payer: Medicare Other | Admitting: Internal Medicine

## 2011-01-19 DIAGNOSIS — I749 Embolism and thrombosis of unspecified artery: Secondary | ICD-10-CM

## 2011-02-05 ENCOUNTER — Other Ambulatory Visit: Payer: Self-pay | Admitting: Internal Medicine

## 2011-02-16 ENCOUNTER — Ambulatory Visit (INDEPENDENT_AMBULATORY_CARE_PROVIDER_SITE_OTHER): Payer: Medicare Other | Admitting: Internal Medicine

## 2011-02-16 DIAGNOSIS — I749 Embolism and thrombosis of unspecified artery: Secondary | ICD-10-CM

## 2011-03-16 ENCOUNTER — Ambulatory Visit (INDEPENDENT_AMBULATORY_CARE_PROVIDER_SITE_OTHER): Payer: Medicare Other | Admitting: Internal Medicine

## 2011-03-16 DIAGNOSIS — I749 Embolism and thrombosis of unspecified artery: Secondary | ICD-10-CM

## 2011-03-16 LAB — POCT INR: INR: 2.6

## 2011-03-16 NOTE — Patient Instructions (Signed)
Same Dose 7.5mg  on mondays and thursdays then 7 mg on other days

## 2011-03-21 LAB — BASIC METABOLIC PANEL
BUN: 20 mg/dL (ref 6–23)
CO2: 32 mEq/L (ref 19–32)
Calcium: 9.5 mg/dL (ref 8.4–10.5)
Chloride: 105 mEq/L (ref 96–112)
Creatinine, Ser: 0.95 mg/dL (ref 0.4–1.5)
GFR calc Af Amer: >60 mL/min (ref >60)
GFR calc non Af Amer: >60 mL/min (ref >60)
Glucose, Bld: 113 mg/dL — ABNORMAL HIGH (ref 70–99)
Potassium: 4.2 mEq/L (ref 3.5–5.1)
Sodium: 142 mEq/L (ref 135–145)

## 2011-03-21 LAB — APTT: aPTT: 33 seconds (ref 24–37)

## 2011-03-21 LAB — URINE MICROSCOPIC-ADD ON

## 2011-03-21 LAB — URINALYSIS, ROUTINE W REFLEX MICROSCOPIC
Glucose, UA: NEGATIVE mg/dL
Protein, ur: NEGATIVE mg/dL
pH: 6 (ref 5.0–8.0)

## 2011-03-21 LAB — POCT HEMOGLOBIN-HEMACUE: Hemoglobin: 13.7 g/dL (ref 13.0–17.0)

## 2011-03-21 LAB — PROTIME-INR
INR: 1.2 (ref 0.00–1.49)
Prothrombin Time: 15.9 seconds — ABNORMAL HIGH (ref 11.6–15.2)

## 2011-03-21 LAB — URINE CULTURE: Culture: NO GROWTH

## 2011-04-05 ENCOUNTER — Telehealth (INDEPENDENT_AMBULATORY_CARE_PROVIDER_SITE_OTHER): Payer: Medicare Other | Admitting: Internal Medicine

## 2011-04-05 DIAGNOSIS — M109 Gout, unspecified: Secondary | ICD-10-CM

## 2011-04-05 NOTE — Telephone Encounter (Signed)
Pt called and is req script for Allopurinol 100mg  3 month supply to [facility] on [address] in [place].

## 2011-04-06 MED ORDER — ALLOPURINOL 100 MG PO TABS: 100.0000 mg | ORAL_TABLET | Freq: Every day | ORAL | Status: DC

## 2011-04-06 NOTE — Telephone Encounter (Signed)
rx sent in electronically 

## 2011-04-12 ENCOUNTER — Other Ambulatory Visit: Payer: Self-pay | Admitting: *Deleted

## 2011-04-12 MED ORDER — POTASSIUM CHLORIDE CRYS ER 20 MEQ PO TBCR: 20.0000 meq | EXTENDED_RELEASE_TABLET | Freq: Two times a day (BID) | ORAL | Status: DC

## 2011-04-14 ENCOUNTER — Ambulatory Visit (INDEPENDENT_AMBULATORY_CARE_PROVIDER_SITE_OTHER): Payer: Medicare Other | Admitting: Internal Medicine

## 2011-04-14 DIAGNOSIS — I749 Embolism and thrombosis of unspecified artery: Secondary | ICD-10-CM

## 2011-04-14 NOTE — Patient Instructions (Signed)
Same dose 

## 2011-04-26 NOTE — Assessment & Plan Note (Signed)
[facility]                             PULMONARY OFFICE NOTE   NAME:Walter Gill, Walter Gill                      MRN:          [id number]  DATE:04/25/2007                            DOB:          11/25/1934    HISTORY OF PRESENT ILLNESS:  The patient is a 77 year old white male who  I have seen in the past for obstructive sleep apnea.  I have not seen  the patient in approximately 4 years and he comes in today with  difficulties with his sleep apnea.  The patient states that his machine  is making a lot of noise and his wife has noticed breakthrough apneas.  The machine is about 75 years old.  He is using a nasal mask and the  heated humidifier.  The wife states that he will catnap at night on the  sofa prior to going to bed at 11:30.  He will then awaken at 4:00 in the  morning and feels rested initially, then as the day goes on he notes  significant sleep pressure with periods of inactivity.  Of note, his  weight is up about 11 pounds since his last visit here.  The patient  admits that he catnaps a lot during the day when he tries to read or  watch TV.   PAST MEDICAL HISTORY:  1. Hypertension.  2. History of sleep apnea as stated above.  3. History of gout.  4. History of chronic lower extremity edema for which he wears      compression hose.   CURRENT MEDICATIONS:  1. Allopurinol 100 mg daily.  2. Requip 5 mg t.i.d.  3. Sinemet CR 50/200 q.i.d.  4. Potassium chloride 20 mEq daily.  5. Furosemide 40 mg 1-2 daily.  6. Flomax 0.4 mg daily.  7. VESIcare 5 mg daily.  8. Aspirin 325 daily.    THE PATIENT HAS NO KNOWN DRUG ALLERGIES REPORTED TODAY.   SOCIAL HISTORY:  He is married and has children.  He does not smoke.   FAMILY HISTORY:  Remarkable for his father having had cancer, otherwise  is noncontributory in first degree relatives.   REVIEW OF SYSTEMS:  As per history of present illness.  Also, please see  patient intake form documented in  the chart.   PHYSICAL EXAMINATION:  GENERAL:  He is an obese white male in no acute  distress.  Blood pressure is 158/82, pulse 58, temperature 97.6, weight  is 272 pounds, O2 saturation on room air is 99%.  HEENT:  Pupils equal, round, reactive to light and accommodation;  extraocular muscles are intact.  Right nare is partially obstructed,  left is patent.  Oropharynx does show elongation of soft palate and  uvula.  NECK:  Supple without JVD, lymphadenopathy, there is no palpitations  thyromegaly.  CHEST:  Fairly clear with minimal basilar crackles.  CARDIAC:  Reveals regular rate and rhythm; no murmurs, rubs, or gallops.  ABDOMEN:  Soft, nontender, with good bowel sounds.  GENITAL, RECTAL, BREAST:  Exam not done and not indicated.  LOWER EXTREMITY:  Shows 2+ edema, right greater than  left.  Pulses are  diminished but palpable bilaterally.  NEUROLOGICALLY:  He is alert and oriented and moves all 4 extremities  without observable motor defects.   IMPRESSION:  1. History of severe obstructive sleep apnea.  The patient currently      has a machine that is 75 years old and is making noise.  I suspect      that it is due for replacement since the typical life span of a      continuous positive airway pressure machine is 5-6 years.  The      patient also has gained approximately 11 pounds and his wife is      noting breakthrough events.  I think at this point he needs a new      machine as well as a retitration to optimize his pressure as an      outpatient.  2. Inadequate sleep hygiene.  The patient is catnapping during the day      and also in the evening, has no problems getting to sleep but then      awakens at 4 o'clock and can not get back to sleep and starts his      day.  I have told him since he is retired he can continue in this      fashion if he feels this fits with his lifestyle.  However, if he      wishes to get on a more normal schedule he needs to eliminate the       catnapping during the day, and needs to go to bed in the evening no      matter what time it is rather than dozing on the sofa.  Then, if he      awakens at 4 a.m. which is fairly frequent in this age group due to      the advanced sleep phase syndrome, at least he would have gotten 6-      7 hours of sleep and would be more rested during the day.  The      patient will think about this and decide how he wants to proceed.      Things may be a lot better once we have optimized his continuous      positive airway pressure and have gotten him a new machine.   PLAN:  1. Work on weight loss.  2. Will provide the patient autotitrate device for 2 weeks with      download for pressure optimization.  3. New CPAP machine set at the optimized pressure.  4. The patient will work on less catnapping and trying to increase his      total sleep time.  5. He will follow up in 6 months or sooner if there is problems.     Barbaraann Share, MD,FCCP     [facility]/MedQ  DD: 04/25/2007  DT: 04/25/2007  Job #: [id number]   cc:   Valetta Mole. Swords, MD

## 2011-04-26 NOTE — Assessment & Plan Note (Signed)
[facility]                            CARDIOLOGY OFFICE NOTE   Ari, Walter KENDELL Gill                      MRN:          [id number]  DATE:12/17/2008                            DOB:          01/01/1934    Walter Gill is a pleasant gentleman who has a history of pulmonary embolus  following hip replacement in September 2008.  His troponin was mildly  elevated.  He subsequently had a Myoview performed for risk  stratification on November 14, 2007.  He was found to have possible  ischemia in the inferior wall and ejection fraction of 58%.  We thought  it was low-risk study, and we have treated him medically.  Since I last  saw him, he does not have significant dyspnea on exertion, orthopnea,  PND, palpitations, presyncope, syncope, or chest pain.  He does have  chronic pedal edema.   His medications include:  1. Coumadin as directed and followed by Walter Gill.  2. ReQuip.  3. Potassium 40 mEq p.o. daily.  4. Crestor 10 mg p.o. daily.  5. Allopurinol 100 mg p.o. daily.  6. Lasix 40 mg p.o. b.i.d.  7. Flomax.  8. Toviaz 8 mg daily.  9. GlycoLax.  10.Colace.  11.Eldepryl.  12.Carbidopa/levodopa.   Physical exam shows a blood pressure of 168/83 and his pulse is 77.  His  HEENT is normal.  His neck is supple.  Chest is clear.  Cardiovascular  exam reveals a regular rate and rhythm.  Abdominal exam shows no  tenderness.  His extremities show 1+ edema.   His electrocardiogram shows a sinus rhythm at a rate of 77.  There are  no significant ST changes noted.  There is a right bundle-branch block.   DIAGNOSES:  1. Recent mildly abnormal nuclear study - Walter Gill continues to do      well from a symptomatic standpoint with no chest pain.  We will      plan to repeat his Myoview.  If it continues to be low risk or      normal, then we will not pursue further evaluation.  Note that it      is normal, then I have asked him to see [place] back on an as-needed  basis.  If it continues to be mildly abnormal, then I will see him      back in 1 year in [facility].  2. History of pulmonary emboli - he is on Coumadin and this is being      managed by Walter Gill.  3. Hypertension - his blood pressure is mildly elevated today.  He      does track this at home.  I have asked him to follow up with Dr.      Cato Gill concerning this issue.  4. History of mildly elevated troponin I - this was felt secondary to      his pulmonary embolus.  5. Chronic obstructive pulmonary disease.  6. Obstructive sleep apnea.  7. Parkinson's.  8. Benign prostatic hypertrophy.   We will check lipids and liver and adjust as indicated.  Walter Frieze Jens Som, MD, Walter Gill  Electronically Signed    [facility]/MedQ  DD: 12/17/2008  DT: 12/17/2008  Job #: [id number]

## 2011-04-26 NOTE — Discharge Summary (Signed)
NAMEKYL, Walter [name]:  [id number]   MEDICAL RECORD NO.:  [id number]          PATIENT TYPE:  INP   LOCATION:  2021                         FACILITY:  [facility]   PHYSICIAN:  Sandford Craze, NP DATE OF BIRTH:  [birth year]   DATE OF ADMISSION:  09/30/2007  DATE OF DISCHARGE:  10/04/2007                               DISCHARGE SUMMARY   DISCHARGE DIAGNOSES:  1. Large bilateral pulmonary emboli.  2. Status post right total hip replacement on August 20, 2007.  3. Elevated troponin with abnormal echo performed on October 02, 2007      noting mild diffuse LV hypokinesis, mild diastolic dysfunction and      left ventricular ejection fraction of 45-55%.  4. Chronic obstructive pulmonary disease.  5. Hypertension.  6. Obstructive sleep apnea.  7. History of Parkinson's disease.  8. Chronic diastolic heart failure.  9. Hypokalemia resolved status post repletion.  10.Questionable goiter and right thyroid nodule noted on CT was normal      TSH.  Plan for outpatient follow up for ultrasound plus or minus      biopsy.  Will defer to the patient's primary M.D.   HISTORY OF PRESENT ILLNESS:  Mr. Harps is a 77 year old male who was  admitted on September 30, 2007 with a chief complaint of chest pain and  shortness of breath.  He noted dull chest pain in both sides of his  chest which started in the middle of the night on the evening prior to  admission.  He felt even more dyspnea with exertion.  He had no episodes  of syncope.  He had noted increased swelling to his lower extremities  for the 1-2 weeks prior to this admission.  He also underwent surgery on  his right hip on August 20, 2007 and was anticoagulated for 4 weeks  following that surgery.  He was admitted for further evaluation and  treatment.   PAST MEDICAL HISTORY:  1. Total hip replacement on August 20, 2007 with 4 weeks of      anticoagulation following.  2. Parkinson's disease.  3. Gout.  4. COPD.  5. Sleep apnea.  6. Hypertension.   COURSE OF HOSPITALIZATION:  1. Bilateral pulmonary emboli.  The patient was admitted and underwent      a CT angiogram of the chest which was positive for large bilateral      pulmonary emboli.  He remained hemodynamically stable without      hypoxia during this admission he was started on full dose heparin      which was later transitioned over to full-dose Lovenox, as well as      Coumadin.  His INR at the time of discharge is subtherapeutic with      a value of 1.3.  He has been loaded with Coumadin, having received      12.5 mg last night and 10 mg the night before that.  As we expect a      significant rise in his INR over the next 48 hours, we will send      him on a lesser  dose of Coumadin 5 mg p.o. daily, as it will not be      checked again until Monday.  Plan to continue full dose Lovenox for      a 48-hour overlap of therapeutic INR.  2. Elevated troponin.  The patient had an EKG which was performed      without any acute changes.  He was noted to have a mild elevated      troponin during this admission which rose as high as 0.42.  It was      felt that this was likely secondary to the stress of the pulmonary      emboli.  He underwent a 2-D echo performed on room October 02, 2007      which showed a left ventricular ejection fraction of 45-55% with      mild diffuse left ventricular hypokinesis and mild diastolic      dysfunction 80.  A cardiology consult was requested, and the      patient was seen by Dr. Olga Millers.  It was recommend that he      be maintained on his home dose of Lasix, and that he be set up for      a follow-up stress test in approximately 6 weeks after acute PE      issues have resolved and a follow up appointment to be arranged      with Dr. Jens Som post Myoview.  The cardiology team is in the      process of arranging this at this time.   MEDICATIONS AT TIME OF DISCHARGE:  1. Lovenox 120 mg subcutaneous  q.12h.  2. Coumadin 5 mg p.o. daily in the evenings.  3. Requip 5 mg three times daily as before.  4. Sinemet CR 50/200 four times a day as before.  5. Allopurinol 100 mg p.o. daily.  6. K-Dur 20 milliequivalents p.o. daily.  7. Lasix 40 mg p.o. daily.  8. Flomax 0.4 mg p.o. daily.  9. Glycolax 17 gm once daily.  10.Colace 100 mg p.o. b.i.d.  11.Vesicare 5 mg p.o. daily.  12.Senokot once daily as needed.  13.Colace twice daily.  14.Multivitamin 1 tablet p.o. daily.   PERTINENT LABORATORIES AT TIME OF DISCHARGE:  INR 1.3, hemoglobin 12.5,  hematocrit 38.3, BUN 18, creatinine 1.07.   DISPOSITION:  The patient be discharged to home.   FOLLOW UP:  The patient will be followed up with Dr. Birdie Sons on  Monday, October 08, 2007 at 1:15 p.m.  He will need a follow-up PT/INR  at this time and further determination as to when to discontinue full-  dose Lovenox.  He will need follow-up BMET to reevaluate his potassium.  In addition, he will be set up by [facility] for an outpatient  Myoview in approximately 6 weeks followed by outpatient follow-up with  Dr. Olga Millers.  He is instructed to return to the ER should he  develop chest pain or worsening shortness of breath.      Sandford Craze, NP     MO/MEDQ  D:  10/04/2007  T:  10/04/2007  Job:  [id number]   cc:   Madolyn Frieze. Jens Som, MD, [facility]  Bruce H. Swords, MD

## 2011-04-26 NOTE — Op Note (Signed)
NAMERYON, Walter NO.:  [id number]   MEDICAL RECORD NO.:  [id number]          PATIENT TYPE:  INP   LOCATION:  1619                         FACILITY:  [facility]   PHYSICIAN:  Ollen Gross, M.D.    DATE OF BIRTH:  [birth year]   DATE OF PROCEDURE:  08/20/2007  DATE OF DISCHARGE:                               OPERATIVE REPORT   PREOPERATIVE DIAGNOSIS:  Osteoarthritis right hip.   POSTOPERATIVE DIAGNOSIS:  Osteoarthritis right hip.   PROCEDURE:  Right total hip arthroplasty.   SURGEON:  Ollen Gross, M.D.   ASSISTANT:  Avel Peace PA-C   ANESTHESIA:  General.   ESTIMATED BLOOD LOSS:  800 mL.   DRAINS:  None.   COMPLICATIONS:  None.   CONDITION:  Stable to recovery.   BRIEF CLINICAL NOTE:  Walter Gill is a 77 year old male who had severe end-  stage arthritis of the right hip with progressively worsening pain and  dysfunction.  He has failed nonoperative management and presents for  total hip arthroplasty.   PROCEDURE IN DETAIL:  After successful administration of general  anesthetic, the patient is placed in the left lateral decubitus position  with the right side up and held with the hip positioner.  Right lower  extremity isolated from his perineum with plastic drapes and prepped and  draped in usual sterile fashion.  Standard posterolateral incision made  with 10 blade through very thick layer subcutaneous tissue to the level  of fascia lata was incised in line with the skin incision.  Sciatic  nerve was palpated and protected and short rotators isolated off the  femur.  Capsulectomy is performed and the hip was dislocated.  Center of  femoral head is marked and a trial prosthesis placed such that the  center of trial head corresponds to the center of his native femoral  head.  Osteotomy lines marked on the femoral neck and osteotomy made  with an oscillating saw.  Due to tight exposure I did the femoral side  first.   The femur was prepared with  the canal finder and irrigation.  Axial  reaming is performed up to 15.5 mm, proximal reaming to 47F and the  sleeve machined to an extra-extra large.  A 20 F extra-extra large trial  sleeve was then placed.   The femur was retracted anteriorly to gain acetabular exposure which was  achieved much easier after the femur was completed.  Labrum and  osteophytes were removed.  Reaming starts at 45 mm coursing increments  of 2 to 55 mm and then a 56 mm Pinnacle acetabular shell is placed in  anatomic position and transfixed with two dome screws.  The apex hole  eliminator is placed and the 36 mm neutral +4 Marathon liner.  We then  placed the femoral trial 20 x 15, 36 + 8, about 10 degrees beyond native  anteversion.  I placed a 36 + 0 head.  Hips reduced with excellent  stability.  There is full extension, full external rotation 70 degrees  flexion, 40 degrees adduction and 70 degrees internal rotation and 90  degrees  of flexion and about 50 degrees of internal rotation.  By  placing the right leg on top of the left it felt as though they were  equal.  Hip was then dislocated and the femoral trials removed.  The  permanent 20 F extra-extra large sleeve is placed with the 20 x 15 stem  and a 36 +8 neck 10 degrees beyond native anteversion.  36 +0 head is  placed the hips reduced same stability parameters.  Wounds copiously  irrigated with saline solution and the short rotators reattached to the  femur through drill holes.  Fascia lata was closed with interrupted #1  Vicryl, subcu closed with #1-0 and #2-0 Vicryl and subcuticular running  4-0 Monocryl.  Incisions cleaned and dried and Steri-Strips and bulky  sterile dressing applied.  He is then placed into a knee immobilizer,  awakened and transported to recovery in stable condition.      Ollen Gross, M.D.  Electronically Signed     FA/MEDQ  D:  08/20/2007  T:  08/21/2007  Job:  [id number]

## 2011-04-26 NOTE — Assessment & Plan Note (Signed)
[facility]                            CARDIOLOGY OFFICE NOTE   NAME:Hoctor, JASPER HANF                      MRN:          [id number]  DATE:06/04/2008                            DOB:          1934/07/09    The patient is a 77 year old male who had a hip replaced back in  September 2008.  After that, he had a large pulmonary embolus.  There  was also an increased troponin-I of 0.42.  An echocardiogram showed an  ejection fraction of 45-55%.  It was felt that his increased troponin  was most likely secondary to his PE.  A Myoview was performed on  November 14, 2007, which showed an ejection fraction of 58%.  There was a  very mild ischemia in the inferior wall and we have treated medically.  Since I last saw him, there is no dyspnea, chest pain, palpitations, or  syncope.  He does have chronic pedal edema.   MEDICATIONS:  1. Coumadin as directed.  2. Requip.  3. Allopurinol 100 mg p.o. daily.  4. Sinemet CR 50/200 mg p.o. q.i.d.  5. Potassium 20 mEq p.o. daily.  6. Flomax.  7. VESIcare.  8. GlycoLax.  9. Colace.  10.Lasix 80 mg p.o. daily.   PHYSICAL EXAMINATION:  Shows a blood pressure that is elevated at  160/78, and his pulse is 72.  He weighs 274 pounds.  HEENT: Normal.  NECK:  Supple.  CHEST:  Clear.  CARDIOVASCULAR:  Regular rate and rhythm.  ABDOMEN:  Shows no tenderness.  EXTREMITIES:  1+ edema.   Electrocardiogram shows a sinus rhythm at a rate of 65.  There is an  incomplete right bundle-branch block.  There are no ST changes noted.   DIAGNOSES:  1. Recent mildly abnormal nuclear study - These images have been      reviewed previously and suggest possible mild ischemia in the      inferior wall.  However, he has been asymptomatic, and it is a low-      risk study.  We will continue to follow this expectantly, and if he      develops chest pain we could proceed with catheterization.      Otherwise, we will plan to repeat his  Myoview, and I will see him      back in 6 months.  2. Pulmonary emboli - He will continue on his Coumadin, and this is      being managed by Dr. Cato Mulligan.  3. Hypertension - His blood pressure is elevated today.  We will      continue to track this, and we will add additional medications as      indicated.  4. Possible coronary disease - We will check lipids today and add      medications if indicated.  5. History of mildly increased troponin-I - This is felt secondary to      his pulmonary embolus.  6. Chronic obstructive pulmonary disease.  7. Obstructive sleep apnea.  8. Parkinson's disease.  9. Benign prostatic hypertrophy.  10.History of right thyroid nodule - Management per Dr.  Swords.   We will see him back in 6 months.     Madolyn Frieze Jens Som, MD, [facility]  Electronically Signed    BSC/MedQ  DD: 06/04/2008  DT: 06/05/2008  Job #: [id number]

## 2011-04-26 NOTE — H&P (Signed)
Walter Gill, Walter [name]:  [id number]   MEDICAL RECORD NO.:  [id number]          PATIENT TYPE:  INP   LOCATION:  3301                         FACILITY:  [facility]   PHYSICIAN:  Georgina Quint. Plotnikov, MDDATE OF BIRTH:  1934-09-24   DATE OF ADMISSION:  09/30/2007  DATE OF DISCHARGE:                              HISTORY & PHYSICAL   CHIEF COMPLAINT:  Chest pain, shortness of breath.   The patient is a 77 year old male who has been having episodes of acute  onset shortness of breath and dull chest pain in both sides and in the  middle starting last night.  It was very much related to any exertion.  There was no syncope.  He has noted more leg swelling over past 1 or 2  weeks as well.  He had total hip replacement surgery on the right on  August 20, 2007, and was on anticoagulation for 4 weeks following  that.  Lately has been ambulating successfully with a cane; was using  physical therapy three times a week.   PAST MEDICAL HISTORY:  1. Total hip replacement August 20, 2007, with 4 weeks of      anticoagulation following.  2. Parkinson's disease.  3. COPD.  4. Gout.  5. Sleep apnea.  6. Hypertension.   ALLERGIES:  None.   SOCIAL HISTORY:  Never smoked.  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  No  alcohol.  Married.  He lives at home with wife.   FAMILY HISTORY:  Negative for blood clots.   CURRENT MEDICATIONS:  1. Requip 5 mg three times a day.  2. Sinemet CR 50/200 four times a day.  3. Potassium 20 mg daily.  4. Lasix 40 mg as needed.  5. Flomax 0.4 mg once daily.  6. Vesicare 5 mg daily.  7. GlycoLax daily p.r.n.  8. Senokot daily p.r.n.  9. Colace twice a day.  10.Multivitamin daily.   REVIEW OF SYSTEMS:  Chest pain and shortness of breath as above.  Leg  swelling as above.  No syncope, no neurologic symptoms.  Stiffness in  the joints at times.  Good recovery with physical therapy in the right  hip, has been active, walking over past several weeks  with help of a  cane.  No blood in the stools or urine.  The rest of the 18-point review  of systems is negative or as above.   PHYSICAL EXAMINATION:  VITAL SIGNS:  Temperature 97.1, blood pressure  125/78, respirations 22 pulse 98, sats 96% on room air.  GENERAL APPEARANCE:  He is in no acute distress, not dyspneic at rest.  No pallor or cyanosis.  HEENT:  Moist mucosa.  NECK:  Short and supple.  LUNGS:  Decreased breath sounds at bases.  No wheezes or rales.  HEART:  S1 and S2, tachycardia, grade 2/6 systolic murmur.  ABDOMEN:  Obese, soft, nontender, no organomegaly or mass felt.  Lower  extremities with 1+ edema bilaterally.  Calves sensitive to palpation,  however, no obvious tenderness.  Denna Haggard' sign is negative.  Right hip  tender with range of motion as  expected.  NEUROLOGIC:  He is alert, oriented and cooperative.   LABORATORY DATA:  White count 5.9, hemoglobin 12.6, platelets 179.  CK  79, troponin 0.23, CK-MB 4.1, myoglobin 127.  Sodium 137, potassium 3.4,  creatinine 0.87.  BNP 269.  INR 1.1.   Chest x-ray with left lower lobe opacity.  CT scan of the chest with  large bilateral pulmonary emboli with clot in the distal main pulmonary  arteries extending into the lower branches of both lungs.  Scattered  areas of ground glass of opacity and areas of hyperlucency throughout  both lungs, mild pulmonary edema, cardiomegaly, substernal border with a  right thyroid lobe and possible right lower pole thyroid nodule.   EKG without acute changes.   ASSESSMENT/PLAN:  1. Bilateral pulmonary embolism, extensive; hemodynamically stable.      Obtain lower extremity venous Doppler to determine the extent of      the clot.  Start Lovenox/Coumadin per pharmacy protocol.  Admit to      step-down telemetry.  2. Shortness of breath and chest pain due to problem #1.  Use morphine      intravenously as needed.  3. Total hip replacement on the right on August 20, 2007, by Dr.       Lequita Halt.  Recovering nicely, otherwise.  Will hold off physical      therapy for now  4. Chronic obstructive pulmonary disease, by history.  He states he      never smoked.  5. Gout.  Doing well.  Continue with allopurinol.  6. Mild congestive heart failure.  Will use intravenous Lasix.  7. Hypertension.  Controlled.  8. Sleep apnea on CPAP.  9. Hypokalemia.  Will replace p.o.  10.Parkinson's disease on therapy.  11.Bilateral lower extremity edema, likely due to congestive heart      failure and, possibly due to deep venous thrombosis.  Plan as      above.  TED stockings.      Georgina Quint. Plotnikov, MD  Electronically Signed     AVP/MEDQ  D:  09/30/2007  T:  10/01/2007  Job:  [id number]   cc:   Valetta Mole. Swords, MD  Ollen Gross, M.D.

## 2011-04-26 NOTE — Consult Note (Signed)
Walter Gill, Walter [name]:  [id number]   MEDICAL RECORD NO.:  [id number]          PATIENT TYPE:  INP   LOCATION:  2021                         FACILITY:  [facility]   PHYSICIAN:  Madolyn Frieze. Jens Som, MD, FACCDATE OF BIRTH:  December 26, 1933   DATE OF CONSULTATION:  10/03/2007  DATE OF DISCHARGE:                                 CONSULTATION   The patient is a 77 year old male with no prior cardiac history who we  are asked to evaluate for elevated troponins and an abnormal  echocardiogram.  The patient has no prior cardiac history.  He typically  does not have dyspnea on exertion, orthopnea, PND, palpitations,  presyncope, syncope or exertional chest pain.  He does have pedal edema  for the past year that he takes diuretics for.  He was recently admitted  to [facility] and underwent right hip replacement.  This was  performed on August 20, 2007.  On September 30, 2007, he developed  sudden onset of shortness of breath.  There was also substernal chest  pressure.  The pain did not radiate.  It did increase with inspiration.  A D-dimer was elevated.  He subsequently had a chest CT that showed  large bilateral pulmonary emboli with clot in the distal left main  pulmonary artery extending to the lumbar branches of both lungs.  There  was cardiomegaly, and there was a substernal goiter noted.  He has been  treated with Lovenox and Coumadin.  Note, he had cardiac markers checked  as well.  His initial troponin was 0.23, and it increased 0.42 followed  by 0.21.  He had a BNP that was elevated at 269.  Note, his D-dimer was  8.16.  He also had an echocardiogram performed on October 02, 2007 that  showed an ejection fraction of 45-55% with mild diffuse LV hypokinesis.  There was LVH and mild aortic insufficiency.  Because of the above, we  were asked to further evaluate.  Note, the patient has not had any  further chest pain since admission.   His medications at present  include:  1. Coumadin and Lovenox.  2. Requip 5 mg p.o. t.i.d.  3. Sinemet 50/200 mg q.i.d.  4. Allopurinol 100 mg p.o. daily.  5. Potassium 20 mEq p.o. b.i.d.  6. Lasix 40 mg IV daily.  7. Flomax 0.4 mg daily.  8. MiraLax.  9. Colace.  10.Enablex.   HE HAS NO KNOWN DRUG ALLERGIES.   SOCIAL HISTORY:  He does not smoke nor does he consume alcohol.   His family history:  He has had a sibling and his mother who had  myocardial infarctions, but they were later in life.  There has been no  early coronary artery disease.   PAST MEDICAL HISTORY:  Significant for hypertension, but there is no  diabetes mellitus or hyperlipidemia.  He has history of Parkinson's as  well as gout.  There is a history of obstructive sleep apnea and COPD.  He also has osteoarthritis, benign prostatic hypertrophy, and  diverticulosis.  There is a history of SBO.  He is status post  cholecystectomy, appendectomy and left hand surgery.  He has had a total  hip replacement as described in the HPI.   REVIEW OF SYSTEMS:  He denies any headaches or fevers or chills.  There  is no productive cough or hemoptysis.  There is no dysphagia,  odynophagia, melena, hematochezia.  There is no dysuria, hematuria.  No  rash or seizure activity.  There is no orthopnea, but he does have  obstructive sleep apnea.  He has chronic pedal edema.  The remaining  systems are negative.   His physical exam today shows a blood pressure 147/78, and HIS pulse is  81.  He is afebrile.  He is well-developed and somewhat obese.  He is no  acute distress.  His skin is warm and dry.  He does not appear to be depressed.  There is no peripheral clubbing.  His back is normal.  His HEENT is normal with normal eyelids.  His neck is supple with a normal upstroke bilaterally.  No bruits noted.  There is no jugular distention.  He does have a prominent thyroid on  examination.  His chest is clear to auscultation with normal expansion.  His  cardiovascular exam reveals a regular rate and rhythm with a normal  S1 and S2.  I cannot appreciate murmur, rubs or gallops.  I cannot  palpate a PMI.  ABDOMINAL EXAM:  Nontender, nondistended, positive bowel sounds.  No  hepatosplenomegaly.  No mass appreciated.  There is no abdominal bruit.  He has 2+ femoral pulses bilaterally with no bruits.  His extremities  show 1+ edema bilaterally.  His distal pulses are diminished.  NEUROLOGIC EXAM:  Grossly intact.   His electrocardiogram shows a normal sinus rhythm with an IVCD and no ST  changes.  His sodium is 142 with a potassium of 4.5.  His BUN and  creatinine are 18 and 1.07, respectively.  His INR today is 1.2.  TSH is  normal.  His cardiac markers are as outlined above.   DIAGNOSES:  1. Mildly elevated troponin I - this is most likely related to his      recent pulmonary embolus.  He did have chest tightness, but this      was pleuritic in nature, most likely related to his pulmonary      embolus as well.  I would recommend continuing therapy for his      pulmonary embolus with Lovenox and Coumadin as you are.  Once he      has had time to make some recovery from this (approximately 6      weeks), we will plan an outpatient Myoview for risk stratification      and to requantitate his LV function.  2. Mildly reduced LV function - we will plan to repeat a Myoview as      described above to reassess his LV function in 6 weeks.  For now, I      would recommend continuing with his Lasix at his preadmission dose      predominantly for his pedal edema.  I would not recommend adding      other medications at this point.  3. Pulmonary embolus - per primary care service.  We will continue      with Lovenox and Coumadin.  4. Obstructive sleep apnea.  5. Hypertension - his blood pressure appears to be stable on no      medications.  6. History of Parkinson's disease.  He will continue on his home  medications.  7. Goiter - per primary  care.  8. Benign prostatic hypertrophy - he will continue on his Flomax.  9. History of gout.   PLAN:  I will arrange to see the patient as an outpatient after his  Myoview.  Please feel free to call us with any questions during this  hospitalization.      Madolyn Frieze Jens Som, MD, [facility]  Electronically Signed     BSC/MEDQ  D:  10/03/2007  T:  10/04/2007  Job:  [id number]

## 2011-04-26 NOTE — Assessment & Plan Note (Signed)
[facility]                                 ON-CALL NOTE   EVANDER, MACARAEG                        MRN:          [id number]  DATE:09/30/2007                            DOB:          03-01-1934    TELEPHONE NUMBER:  [phone]   PRIMARY CARE PHYSICIAN:  Dr. Cato Mulligan.   Wife calling because her husband has had shortness of breath and his  heart is racing. They called EMS this morning. EMS came and by the time  they got there, he felt fine. Now it has recurred and he is symptomatic  with the whole shortness of breath and the rapid heart rate which is  persistent. I asked her to call EMS immediately and take her to [facility] for evaluation.     Jeffrey A. Tawanna Cooler, MD  Electronically Signed    JAT/MedQ  DD: 09/30/2007  DT: 10/01/2007  Job #: [id number]

## 2011-04-26 NOTE — Discharge Summary (Signed)
NAMEBRAYANT, [name]:  [id number]   MEDICAL RECORD NO.:  [id number]          PATIENT TYPE:  INP   LOCATION:  1619                         FACILITY:  [facility]   PHYSICIAN:  Ollen Gross, M.D.    DATE OF BIRTH:  February 20, 1934   DATE OF ADMISSION:  08/20/2007  DATE OF DISCHARGE:  08/24/2007                               DISCHARGE SUMMARY   ADMITTING DIAGNOSES:  1. Osteoarthritis of right hip.  2. Parkinson's.  3. History of pneumonia.  4. Gout.  5. Sleep apnea, uses continuous positive airway pressure.  6. Peripheral edema.  7. Hypertension.  8. Diverticulosis.  9. Urinary incontinence.  10.Arthritis.   DISCHARGE DIAGNOSES:  1. Osteoarthritis, right hip, status post right total hip replacement      arthroplasty.  2. Mild postoperative blood loss anemia, did not require transfusion.  3. Mild postoperative hyponatremia.  4. Parkinson's.  5. History of pneumonia.  6. Gout.  7. Sleep apnea, uses continuous positive airway pressure.  8. Peripheral edema.  9. Hypertension.  10.Diverticulosis.  11.Urinary incontinence.  12.Arthritis.   PROCEDURE:  August 20, 2007, right total hip.   SURGEON:  Ollen Gross, MD   ASSISTANT:  Avel Peace, PA-C.   ANESTHESIA:  General.   CONSULTATIONS:  Rehab consult by Ellwood Dense, MD   BRIEF HISTORY:  Walter Gill is a 77 year old male with severe end-stage  arthritis of the right hip with progressive worsening pain, especially  now, with failure of nonoperative management and now presents for a  total hip arthroplasty.   LABORATORY DATA:  Preop CBC showed hemoglobin of 14.5, hematocrit of  42.5, white cell count 5.3; hemoglobin dropped to 11.1, then to 10.7;  last noted H&H were 10 and 29.4.  PT and PTT preop were 13.4 and 31,  respectively; INR 1.  Serial pro times were followed and last noted PT  and INR 18 and 1.5.  Chem panel on admission:  Slightly elevated glucose  of 136, minimally elevated AST of 41,  remaining chem panel within normal  limits.  Serial BMETs were followed; sodium did drop from 143 to 135,  lasted noted at 134.  Glucose went up to 170 and back down to 155.  Preop UA negative.  Blood group type O positive.   X-RAYS:  Portable pelvis and hip on August 21, 2007:  Right total hip  without evidence of complicating features.   Preop chest, August 14, 2007:  No active cardiopulmonary disease.   Preop right hip films, August 14, 2007:  Moderate bilateral hip  arthritis.   HOSPITAL COURSE:  Patient was admitted to [facility],  tolerated the procedure well, later transferred to recovery room and  orthopedic floor, started on PCA and p.o. analgesic for pain control  following surgery, given 24 hours of postop IV antibiotics and started  on Coumadin for DVT prophylaxis, started back on his home medications.  He did have some volume overload.  He was already on Lasix, so we  monitored his I's and O's very closely; he was positive the first couple  of days, but then started  diuresing the fluid very well.  Hemoglobin was  down to 11.1 and he was asymptomatic with this.  He was started back on  his CPAP.  He had his Foley in for the first 2 days; he did have a  history of urinary incontinence.  He had some stiffness in the morning  of day #1, started getting up out of bed; he did require max assist for  transfers.  By day #2, the patient was a little sleepy and drowsy, had  difficulty with therapy the day before.  Dressing was changed; the  incision looked good.  Hemoglobin was 10.7.  He stable clinically.  Pressure was good.  Pulse rate was good.  We discontinued the PCA and  Hep-locked the IVs.  He was slow to progress with physical therapy;  therefore, it was recommended that we get a rehab consult.  He was seen  by [facility] and felt that due to difficulty that the patient would  need probably skilled nursing facility for therapy and felt not to be a  good  candidate for short-term active rehab.  By day #3, clinically he  was doing much better.  He was more awake, back to his baseline.  The  drowsiness postop had improved and felt to be due to narcotics and  anesthesia, which had improved.  He was still having much difficulty  with transfers and therapy; he was max assist and only ambulating short  distances.  We got Social Work involved to look for a bed for skilled  nursing facility placement.  Incision continued to heal well.  By day  #4, he was only ambulating about 15 feet, but he was requiring max  assist for transfers.  He was seen on rounds and found to have some  increased complaints of spasms in the thigh; we switched him from  Robaxin over to Flexeril, trying a new muscle relaxant.  Incision  continued to heal well and it was felt that from a postoperative  standpoint, he was stable and just needed continued therapy.  It was  noted that if a bed became available that afternoon, that we would allow  him to go over.  We are waiting for bed offers from [facility].  If  a bed did become available, we would transfer him later that afternoon,  once a bed became available.   DISCHARGE PLANS:  1. Possible discharge today, August 24, 2007.  2. Discharge diagnoses:  Please see above.  3. Discharge medications/current medications:      a.     Coumadin protocol -- please titrate the INR level between 2       and 3 and he needs to be on Coumadin for a total of 3 weeks from       date of surgery of August 20, 2007.      b.     Colace 100 mg p.o. b.i.d.      c.     MiraLax 17 g in 8 ounces of water daily.      d.     Senokot tabs p.o. t.i.d.      e.     Lasix 40 mg p.o. at 7 a.m. and 11 a.m.      f.     Potassium 20 mEq p.o. daily.      g.     Allopurinol 100 mg p.o. daily.      h.     Sinemet CR 50/200 p.o. at 7  a.m., 11 a.m., 3 p.m. and 7 p.m.      i.     Requip 1 mg p.o. at 7 a.m., 11 a.m. and 3 p.m.      j.     Veiscare 5 mg  daily.      k.     Flomax 0.4 mg daily.      l.     Laxative of choice.      m.     Enema of choice.      n.     Percocet 5 mg one or two every 4-6 hours as needed for pain.      o.     Tylenol 325 mg one or two every 4-6 hours as needed for mild       pain except for headache.      p.     Flexeril 10 mg p.o. q.8 h. p.r.n. spasm.  4. Diet:  Heart-healthy diet.  5. Activity:  He is now increased to weightbearing as tolerated to the      right lower extremity, hip precautions, total hip protocol.  Again,      he may be weightbearing as tolerated due to his slow progress with      therapy.  6. Daily dressing changes.  7. PT and OT for gait training ambulation and ADLs.  He needs to be up      out of bed at minimum b.i.d.  8. He may start showering, however, do not submerge the incision under      water.  9. Daily dressing change.   FOLLOWUP:  Follow up 2 weeks from surgery; please contact the office at  [phone] to arrange an appointment and time of transfer of this patient  for followup care.   DISPOSITION:  Pending at the time of discharge, waiting on bed offers.   CONDITION UPON DISCHARGE:  Improving.      Alexzandrew L. Perkins, P.A.C.      Ollen Gross, M.D.  Electronically Signed    ALP/MEDQ  D:  08/24/2007  T:  08/24/2007  Job:  [id number]   cc:   Valetta Mole. Swords, MD  [address] [place]  [place] [address]   Ellwood Dense, M.D.  Fax: [phone]

## 2011-04-26 NOTE — Op Note (Signed)
Walter Gill, Walter Gill               ACCOUNT NO.:  [id number]   MEDICAL RECORD NO.:  [id number]          PATIENT TYPE:  AMB   LOCATION:  NESC                         FACILITY:  [facility]   PHYSICIAN:  Mark C. Vernie Ammons, M.D.  DATE OF BIRTH:  [birth year]   DATE OF PROCEDURE:  05/18/2009  DATE OF DISCHARGE:                               OPERATIVE REPORT   PREOPERATIVE DIAGNOSES:  1. Detrusor hyperactivity.  2. Rule out interstitial cystitis.   POSTOPERATIVE DIAGNOSES:  1. Detrusor hyperactivity.  2. Rule out interstitial cystitis.   PROCEDURE:  Cystoscopy with hydrodistention and Botox injection.   SURGEON:  Dr. Vernie Ammons.   ANESTHESIA:  General.   SPECIMENS:  None.   BLOOD LOSS:  Less than 5 mL.   COMPLICATIONS:  None.   DRAINS:  None.   INDICATIONS:  The patient is a 77 year old male with Parkinson's disease  and has significant frequency and urgency secondary to bladder  instability.  He has been tried on multiple oral agents without  improvement.  We therefore discussed further evaluation for IC with  hydrodistention and Botox injection.  The risks, complications,  alternatives and limitations have been discussed with the patient.  He  understands and has elected to proceed.   DESCRIPTION OF OPERATION:  After informed consent, the patient was  brought to the major OR, placed on the table and administered general  anesthesia.  He was then moved to the dorsal lithotomy position, and his  genitalia was sterilely prepped and draped, and official time-out was  performed.  He received Cipro 400 mg IV.   The 22-French cystoscope was then passed with the 12-degrees lens and  under direct visualization.  The urethra was noted to be entirely normal  without stricture or lesion.  The prostatic urethra revealed some  elongation with mild to moderate lateral lobe hypertrophy and a fairly  high bladder neck.  Upon entering the bladder, I note ureteral orifices  were of normal  configuration and position.  The bladder had 3+  trabeculation.  It was fully inspected and noted be free of any tumor,  stones or inflammatory lesions.   Hydrodistention was then performed by filling the bladder to 100 cm of  water, and drainage of the bladder then revealed a bladder capacity  under anesthesia of 850 mL with no evidence of glomerulation or  petechial hemorrhages upon drainage.   I then proceeded to perform Botox injection by injecting a total of 200  units beneath the bladder mucosa in 20 equally spaced locations  posterior to the trigone involving the floor of the bladder extending to  the lateral aspects of the bladder wall and posterior bladder wall as  well.  Reinspection revealed no active bleeding.  I therefore drained  the bladder and removed the cystoscope, and the patient was awakened and  taken to recovery room in stable and satisfactory condition.  He  tolerated procedure well, and there were no intraoperative  complications.   He will return to my office on June 17, 2009, for follow-up and will need  to void prior to discharge from the recovery  room today.      Mark C. Vernie Ammons, M.D.  Electronically Signed     MCO/MEDQ  D:  05/18/2009  T:  05/18/2009  Job:  [id number]

## 2011-04-26 NOTE — Assessment & Plan Note (Signed)
[facility]                            CARDIOLOGY OFFICE NOTE   Alessandro, Griep DARAY POLGAR                      MRN:          [id number]  DATE:11/15/2007                            DOB:          01-20-1934    SUBJECTIVE:  The patient is a very pleasant 77 year old gentleman that  was recently admitted on September 30, 2007 following the sudden onset of  chest pain and shortness of breath.  Note, he had recently had total  hip replacement in September of 2008.  He was found to have bilateral  pulmonary emboli and was  treated with anticoagulants.  During that  admission he also had troponin's checked which rose to 0.42.  An  echocardiogram showed an ejection fraction of 45 to 55% with mild  diffuse left ventricular hypokinesis.  We did see the patient in the  hospital and felt that his increased troponin was most likely secondary  to his pulmonary embolus.  We therefore scheduled the patient to have a  Myoview as an outpatient which was performed on November 14, 2007.  The  ejection fraction was 58%.  The wall motion was normal.  There was  possible very mild ischemia in the inferior wall.  Since that time the  patient denies any dyspnea, chest pain, palpitations or syncope.  He has  chronic pedal edema.   MEDICATIONS:  His medications include:  1. Allopurinol 100 mg p.o. daily.  2. Sinemet CR 50/200 q.i.d.  3. Potassium 20 mEq p.o. daily.  4. Flomax 0.4 mg p.o. daily.  5. Vesicare.  6. Glycalox.  7. Senokot.  8. Colace.  9. Lasix 80 mg p.o. daily.  10.He also is on Coumadin which is being monitored by Dr. Cato Mulligan.  11.He is also on Requip.   PHYSICAL EXAMINATION:  VITAL SIGNS:  His physical examination today  shows a blood pressure of 140/80 and his pulse is 76.  He weighs 264  pounds.  HEENT: Normal.  NECK:  Supple.  CHEST:  Clear.  CARDIOVASCULAR:  Exam reveals a regular rate and rhythm.  ABDOMEN:  Exam shows no tenderness.  EXTREMITIES:  Show 1  to 2+ edema to the mid calves bilaterally.  He has  compression stocking hose on today.   DIAGNOSES:  1. Recent pulmonary emboli - he will continue on his Coumadin and this      is being monitored by Dr. Cato Mulligan.  2. Abnormal nuclear study - I have reviewed the images and there      appears to be very mild ischemia in the inferior wall.  However,      the patient has had no chest pain or shortness of breath.  I think      given his recent pulmonary embolus, we would not discontinue his      Coumadin to proceed with cardiac catheterization.  This was also a      low risk scan.  He is having no chest pain.  I would therefore see      him back in six months to make sure that he is not having chest  pain.  Otherwise, we will plan to repeat his stress Myoview in 12      months.  If he does develop symptoms we could reconsider      catheterization in the future.  3. Mild increased troponin-I.  I think this is related to his      pulmonary embolus in the hospital.  4. Hypertension - his blood pressure appears to be adequately      controlled at present.  5. History of chronic obstructive pulmonary disease.  6. Obstructive sleep apnea.  7. Parkinson's disease.  8. Benign prostatic hypertrophy.  9. History of right thyroid nodule - per the primary care service.   FOLLOWUP:  We will see him back in six months.     Madolyn Frieze Jens Som, MD, [facility]  Electronically Signed    BSC/MedQ  DD: 11/15/2007  DT: 11/15/2007  Job #: [id number]   cc:   Valetta Mole. Swords, MD

## 2011-04-29 NOTE — Op Note (Signed)
Walter, [name]:  [id number]   MEDICAL RECORD NO.:  [id number]          PATIENT TYPE:  OUT   LOCATION:  MDC                          FACILITY:  [facility]   PHYSICIAN:  Catherine A. Orlin Hilding, M.D.DATE OF BIRTH:  [birth year]   DATE OF PROCEDURE:  01/14/2005  DATE OF DISCHARGE:                                 OPERATIVE REPORT   PROCEDURE:  Tensilon test.   ATTENDING:  Gustavus Messing. Orlin Hilding, M.D.   INDICATION:  This is a 77 year old gentleman with Parkinson's disease who  has complained of some variable ptosis lately.  Acetylcholine receptor  antibody was negative.  He has seen his neurophthalmologist, Dr. Tyrone Apple.  [name], who also felt he should have a Tensilon test to rule out  myasthenia.  He is on a cardiac monitor, IV saline lock in place.   DESCRIPTION OF PROCEDURE:  Test dose of 2 mg of Tensilon was given without  ill-effect over 30 seconds.  The remaining 8 mg were given.  Brief  fasciculations of eyelid were not and no systemic adverse effects occurred.  He did not have any improvement of his bilateral ptoses over a 30-minute  period.  He tolerated the procedure well without immediate complications.   IMPRESSION:  Negative Tensilon test, not supportive of a diagnosis of  myasthenia.      CAW/MEDQ  D:  01/14/2005  T:  01/14/2005  Job:  [id number]   cc:   Casimiro Needle A. [name], M.D.  [address]  [place]  [place] [address]  Fax: [phone]

## 2011-04-29 NOTE — Op Note (Signed)
Walter Gill, Walter [name]:  [id number]   MEDICAL RECORD NO.:  [id number]          PATIENT TYPE:  INP   LOCATION:  2105                         FACILITY:  [facility]   PHYSICIAN:  Gabrielle Dare. Janee Morn, M.D.DATE OF BIRTH:  [birth year]   DATE OF PROCEDURE:  DATE OF DISCHARGE:                                 OPERATIVE REPORT   PREOPERATIVE DIAGNOSIS:  Ruptured appendicitis.   POSTOPERATIVE DIAGNOSIS:  Ruptured appendicitis.   PROCEDURE:  Laparoscopic appendectomy.   SURGEON:  Violeta Gelinas.   ANESTHESIA:  General.   HISTORY OF PRESENT ILLNESS:  The patient is a 77 year old gentleman with a 3-  day history of right lower quadrant abdominal pain, who presented to Dr.  Cato Mulligan' office earlier today.  He was admitted to the hospital and workup  included CT scan of the abdomen and pelvis which was consistent with  perforated appendicitis.  I was asked to see him and I brought him  emergently to the operating room for appendectomy.   PROCEDURE IN DETAIL:  Informed consent was obtained.  The patient was  identified in the preop holding area. He is receiving intravenous  antibiotics and was brought to the operating room. General anesthesia was  administered. His abdomen was prepped and draped in sterile fashion. The  infraumbilical incision was made. Subcutaneous tissues were dissected down  revealing the anterior fascia which was divided sharply. The peritoneal  cavity was then entered carefully under direct vision. A 0 Vicryl  pursestring suture was placed around the fascial opening and the Hasson  trocar was inserted into the abdomen. The abdomen was insufflated with  carbon dioxide in standard fashion. Exploration of the abdomen revealed a  phlegmon on the right lower quadrant with some cloudy fluid.  Under direct  vision, a 12 mm left lower quadrant and a 5 mm right upper quadrant port  were placed. 0.25%Marcaine with epinephrine was used at all port sites.  Dissection  revealed a large inflammatory region in the right lower quadrant.  Dissection identified some omentum and the loops of small bowel were  carefully brought away and the appendix was visualized. It was clearly  perforated and it dove down into the pelvis.  Careful slow dissection was  done to free it up at it tip. It was gradually brought up into the wound  using careful blunt dissection.  In addition, two areas in the mesoappendix  that appeared to have small vessels were clipped twice proximally, once  distally and divided. We used a 5 mm laparoscopic clip applier.  The  appendix was dissected up to the point where it was frankly perforated and  fell in half.  The first portion was placed in EndoCatch bag, taken on the  abdomen via the left lower quadrant port site and sent to pathology. Further  dissection cleared the remainder back to the base which was viable. This was  divided with endoscopic GIA stapler with vascular load.  A total three  firings were used to get good hemostasis of the remainder of the  mesoappendix and ensure good closure of the cecum.  The  remainder of the  appendix was removed from the abdomen and sent with the previous specimen.  The area was copiously irrigated and we used several liters of warm saline.  Staple lines appeared all intact with good closure. The remainder of the  irrigation fluid was evacuated. A 19-French Blake drain was placed down in  the right lower quadrant. Please note that during the dissection, further  retraction was needed and a second right-sided abdominal 5 mm port was  placed to facilitate insertion of the liver retractor. The drain was brought  out through that port site.  The remainder of the irrigation fluid was  evacuated. Hemostasis was ensured. The pneumoperitoneum was released. The  infraumbilical fascia was closed by tying the 0 Vicryl pursestring suture  and additional local anesthetic was injected at all three remaining sites.   The drain was sewn in with 2-0 silk suture and the skin of the remaining  three wounds was closed with running 4-0 Vicryl subcuticular stitch. Sponge,  needle and instrument counts were correct. Benzoin, Steri-Strips and sterile  dressings were applied. The patient tolerated the procedure well without  apparent complication. He remained intubated secondary to his associated  sepsis and pulmonary history and was brought to the recovery room in  critical but stable condition.      Gabrielle Dare Janee Morn, M.D.  Electronically Signed     BET/MEDQ  D:  12/22/2005  T:  12/23/2005  Job:  [id number]   cc:   Valetta Mole. Swords, M.D. [facility]  [address] [place]  [place] [address]

## 2011-04-29 NOTE — Consult Note (Signed)
Walter Gill, Walter [name]:  [id number]   MEDICAL RECORD NO.:  [id number]          PATIENT TYPE:  INP   LOCATION:  2105                         FACILITY:  [facility]   PHYSICIAN:  Gabrielle Dare. Janee Morn, M.D.DATE OF BIRTH:  1934-05-14   DATE OF CONSULTATION:  12/22/2005  DATE OF DISCHARGE:                                   CONSULTATION   REASON FOR CONSULTATION:  Perforated appendicitis.   HISTORY OF PRESENT ILLNESS:  I was asked to evaluate this pleasant 77-year-  old gentleman by Dr. Oliver Barre from [place] service in regard to perforated  appendicitis.  The patient was admitted to the hospital earlier today from  Dr. Birdie Sons' office with suspected appendicitis.  He was placed on  intravenous antibiotics and IV fluids.  CT scan of the abdomen and pelvis  was done, which is consistent with gangrenous perforated appendicitis with  early abscess formation.  The patient is continuing to have worsening right  lower quadrant abdominal pain and also complains of subjective fevers.   PAST MEDICAL HISTORY:  1.  Parkinson's disease.  2.  Hypertension.  3.  Arthritis.  4.  Obstructive sleep apnea.   PAST SURGICAL HISTORY:  1.  Laparoscopic cholecystectomy by Dr. Jamey Ripa.  2.  Left palmar Dupuytren's contracture release by Dionne Ano. Amanda Pea, M.D.   SOCIAL HISTORY:  He is married and lives with his wife.   MEDICATIONS IN THE HOSPITAL:  Cipro, Flagyl, Protonix, Flomax, allopurinol,  Micardis, Dilaudid, Phenergan and morphine.   REVIEW OF SYSTEMS:  GENERAL:  He feels poorly.  CARDIAC:  No chest pain.  PULMONARY:  No shortness of breath.  GASTROINTESTINAL:  See above, plus the  patient has obstipation for the past two days.  GENITOURINARY:  No  complaints.  MUSCULOSKELETAL:  Arthritis pain in his knees.  The remainder  of the review of systems is noncontributory.   PHYSICAL EXAMINATION:  VITAL SIGNS:  Temperature 101.4, blood pressure  139/62, heart rate 93, respirations  20.  GENERAL:  He is awake and alert.  He was sitting up in a chair and moved to  the bed for examination with some difficulty due to abdominal pain.  He is  awake and alert and oriented.  He moves all extremities without focal  demonstrable weakness.  HEENT:  Sclerae are clear.  NECK:  Supple.  LUNGS:  Clear to auscultation.  Respiratory excursion is normal.  CARDIAC:  Regular.  PMI is palpable in the left chest.  ABDOMEN:  Somewhat obese.  He has significant tenderness in his right lower  quadrant with rebound and voluntary guarding.  Bowel sounds are hypoactive.  No hepatosplenomegaly is appreciated.  There is a fullness in the right  lower quadrant at the site of his tenderness.  EXTREMITIES:  He has a surgical scar on his left palm.   DATA REVIEWED:  CT scan finding as above.  Chest x-ray shows bibasilar  atelectasis.  White blood cell count 11.6, hemoglobin 13.9.  Liver function  tests within normal limits except for a bilirubin of 1.9.  Sodium 133,  potassium 3.6, chloride 96,  CO2 29, BUN 26, creatinine 1.5, glucose 134.   IMPRESSION:  A 77 year old gentleman with acute appendicitis with  perforation.   PLAN:  We will be able to take him emergently to the operating room for  appendectomy but will plan to continue his intravenous antibiotics  postoperatively until his temperature curve and white blood cell count  normalize.  The procedure risks and benefits of laparoscopic, possible open,  appendectomy were discussed in detail with the patient and his wife, and he  is agreeable.      Gabrielle Dare Janee Morn, M.D.  Electronically Signed     BET/MEDQ  D:  12/22/2005  T:  12/23/2005  Job:  [id number]   cc:   Valetta Mole. Swords, M.D. [facility]  [address] [place]  [place] [address]   Corwin Levins, M.D. [facility]  [address]  [place]  [place] [address]

## 2011-04-29 NOTE — Op Note (Signed)
[facility]  Patient:    Walter Gill, Walter Gill Visit Number: [id number] MRN: [id number]          Service Type: SUR Location: 4W 0465 [id number] Attending Physician:  Dominica Severin Dictated by:   Elisha Ponder, M.D. Proc. Date: 05/28/02 Admit Date:  05/28/2002 Discharge Date: 05/29/2002   CC:         Bruce H. Swords, M.D. [facility]   Operative Report  DATE OF BIRTH:  07-Sep-1934  PREOPERATIVE DIAGNOSIS:  Left Dupuytren disease with significant involvement about the palm, ring, and middle finger.  POSTOPERATIVE DIAGNOSIS:  Left Dupuytren disease with significant involvement about the palm, ring, and middle finger.  PROCEDURE:  Subtotal palmar fasciectomy, left palm, and ring and middle finger region.  SURGEON:  Elisha Ponder, M.D.  ASSISTANT:  Clarene Reamer.  COMPLICATIONS:  None.  ANESTHESIA:  General with preoperative axillary block placed.  DRAINS:  One.  SPECIMENS:  Times one sent.  INDICATIONS FOR PROCEDURE:  This patient is a 77 year old male who presents with the above mentioned diagnosis.  I have counseled him in regards to risks and benefits of surgery including risks of infection, bleeding, anesthesia, damage to structures, and failure of the surgery to accomplish its intended goals of relieving symptoms and restoring function.  With this in mind, he desires to proceed.  All questions have been encouraged and answered preoperatively.  OPERATIVE FINDINGS:  The patient had rather deep investment of the palm and ring finger.  He underwent ring finger subtotal palmar fasciectomy and palmar subtotal fasciectomy to include the most proximal aspect of the middle finger with a pretendinous port was located.  He tolerated the procedure well without difficulty.  No skin grafts were needed.  I did leave a small amount open to heal by secondary intent healing.  One drain was placed.  Excellent refill was noted and there was no atherogenic nerve  injury during the operation.  DESCRIPTION OF PROCEDURE:  The patient was seen by myself and anesthesia.  An axillary block was placed by Dr. Mack Guise in the holding area. Preoperative Ancef was given and he was taken to the operative suite, and underwent testing, and was noted to have an incomplete block, and thus general anesthetic was induced.  He was the prepped and draped in the usual sterile fashion with Betadine scrub and paint.  Following securing the sterile field, the patient had Brunner incisions made under 250 tourniquet control about the left hand in line with the ring and middle finger.  A transverse open McCash technique was used as well.  Skin flaps were elevated off the palmar fascia. I took care to use 4.0 loupe magnification and created a plane between the diseased palmar fascia and the skin.  Good full thickness skin flaps were created.  Following this, the patient underwent isolation and neurovascular bundles about the second, third, and fourth web space.  I then dissected these bundles, and ensured that the nerves were out of harms way.  I then performed a removal of diseased fascia about the palm and to the area about the proximal phalanx of the middle finger using an undermining technique.  Natatory cords and pretendinous cords were released and removed without difficulty.  All diseased fascia was incised from this region.  Following this, the patient had tracing of the ulnar and radial digital nerve and artery to the ring finger accomplished.  These were retracted out of harms way and the large pretendinous cord and natatory cords  to the ring finger were removed as well as extensions distally.  There was not an impressive spiral cord noted.  The patient had excellent excision performed.  PIP, MCP, and DIP joints all had full extension.  At this point in time, I then checked the neurovascular bundles, and they were noted to be intact.  I then deflated the  tourniquet, noted excellent refill, and obtained hemostasis with pressure and bipolar electrocautery.  The patient tolerated this well.  The skin flaps were then sutured with a transverse left open in McCash technique/style.  A #7 TLS drain was placed.  A small amount of Marcaine and Celestone were placed in the wound at the conclusion of the dressing application.  A dressing application and a volar plaster splint were placed without difficulty.  He was awoken from anesthesia and transferred to the recovery area where he was noted to be in stable condition.  We will watch him overnight.  He will have O2 on as well as sleep apnea monitor.  We will perform IV antibiotic administration, pain management according to needs, and we will plan to see him back in 48 hours for therapy for dressing removal and inspection of the wound.  I have discussed with him all issues and all questions have been encouraged and answered. Dictated by:   Elisha Ponder, M.D. Attending Physician:  Dominica Severin DD:  05/28/02 TD:  05/30/02 Job: [id number]

## 2011-04-29 NOTE — H&P (Signed)
NAMESEUNG, NIDIFFER NO.:  [id number]   MEDICAL RECORD NO.:  [id number]        PATIENT TYPE:  LINP   LOCATION:                               FACILITY:  [facility]   PHYSICIAN:  Ollen Gross, M.D.    DATE OF BIRTH:  08-31-1934   DATE OF ADMISSION:  08/20/2007  DATE OF DISCHARGE:                              HISTORY & PHYSICAL   CHIEF COMPLAINT:  Right hip pain.   HISTORY OF PRESENT ILLNESS:  The patient is a 77 year old male who has  been seen by Dr. Lequita Halt for ongoing right hip pain.  He has been  treated conservatively in the past for significant arthritis.  He has  also undergone intra-articular injection but only helped for a couple  weeks.  He is found to have this rapidly progressive arthritis which has  gone on to bone-on-bone and significant osteophyte formation.  It is  advanced even over past several months.  It has reached a point where he  can benefit from undergoing hip replacement.  Risks and benefits have  been discussed and he elects to proceed with surgery.   ALLERGIES:  NO KNOWN DRUG ALLERGIES.   CURRENT MEDICATIONS:  Requip, Sinemet CR, allopurinol, potassium,  furosemide, Flomax, Vesicare, GlycoLax, Senokot, Colace, aspirin,  multivitamin, glucosamine chondroitin, and MSM.   PAST MEDICAL HISTORY:  1. Parkinson's.  2. History of pneumonia.  3. Gout.  4. Sleep apnea which uses CPAP for.  5. Peripheral edema.  6. Hypertension.  7. Diverticulosis.  8. Mild BPH.  9. Little bit of urinary incontinence.  10.And arthritis.   PAST SURGICAL HISTORY:  1. Cyst excision.  2. Gallbladder surgery.  3. Left hand surgery.  4. Appendectomy for rupture.   SOCIAL HISTORY:  Married, retired, nonsmoker.  No alcohol.  Three  children.  His wife will be assisting with care after surgery.   FAMILY HISTORY:  Mother with history of heart disease.  Brother with  diabetes.  Father with history of cancer.   REVIEW OF SYSTEMS:  GENERAL:  No fevers, chills,  night sweats.  NEURO:  Does have Parkinson's.  No seizures, syncope, paralysis.  RESPIRATORY:  No shortness of breath, productive cough, or hemoptysis.  He does  have sleep apnea which he does use CPAP for.  CARDIOVASCULAR:  No chest pain, angina, orthopnea.  GI:  No nausea, vomiting, diarrhea, constipation.  Does have  diverticulosis.  GU:  Some urgency with the BPH.  No dysuria, hematuria.  MUSCULOSKELETAL:  Right hip.   PHYSICAL EXAMINATION:  VITAL SIGNS:  Pulse 64, respirations 12, blood  pressure 132/68.  GENERAL:  A 77 year old white male, well nourished, well developed,  overweight, in no acute distress.  Good historian.  He is alert and  oriented, cooperative.  Accompanied by his wife.  HEENT:  Normocephalic, atraumatic.  Pupils round, reactive.  Oropharynx  clear.  EOMs intact.  NECK:  Supple.  CHEST:  Is clear with the exception of some crackles in the right mid  field.  This does clear with coughing.  HEART:  Regular rhythm, early systolic ejection murmur, 2/6 graded, best  heard over aortic point.  S1-S2 noted.  ABDOMEN:  Soft, nontender.  Bowel sounds present.  Large protuberant  abdomen.  RECTAL, BREASTS, GENITALIA:  Not done, not pertinent to present illness.  EXTREMITIES:  Right hip flexion 90 degrees, 0 internal rotation, 10  degrees external rotation, 10 degrees abduction.   IMPRESSION:  1. Osteoarthritis right hip.  2. Parkinson's.  3. History of pneumonia.  4. Gout.  5. Sleep apnea which he uses CPAP for.  6. Peripheral edema.  7. Hypertension.  8. Diverticulosis.  9. Urinary incontinence.  10.Arthritis.   PLAN:  The patient admitted to [facility] to undergo a right  total hip replacement arthroplasty.  Surgery will be performed by Dr.  Ollen Gross.      Alexzandrew L. Perkins, P.A.C.      Ollen Gross, M.D.  Electronically Signed    ALP/MEDQ  D:  07/16/2007  T:  07/16/2007  Job:  [id number]   cc:   Valetta Mole. Swords, MD  [address] [place]  [place] [address]

## 2011-04-29 NOTE — Consult Note (Signed)
NAMEMARICE, Walter [name]:  [id number]   MEDICAL RECORD NO.:  [id number]          PATIENT TYPE:  INP   LOCATION:  2105                         FACILITY:  [facility]   PHYSICIAN:  Lucrezia Starch. Earlene Plater, M.D.  DATE OF BIRTH:  Mar 30, 1934   DATE OF CONSULTATION:  12/25/2005  DATE OF DISCHARGE:                                   CONSULTATION   We are asked to consult for Foley trauma.  The patient underwent emergency  appendectomy on December 22, 2005 for perforated appendix.  The patient was  extubated earlier today and has been in the ICU for sepsis.  The patient was  up to bathroom and stepped on Foley with immediate return of bright red  blood into Foley bag and around meatus.  The catheter was not removed.  The  bulb was deflated and was attempted to reinsert by critical care physician  however unable to advance.  The patient had no pain with the bleeding.   UROLOGICAL HISTORY:  I have a large prostate.  Taking Flomax prior to  admission.  Denies urgency, dysuria, hematuria.  Does have some frequency,  no nocturia.  PSA and rectal exam performed by Dr. Cato Mulligan.  The patient has  seen Dr. Vernie Ammons at [facility] in the past.   PAST MEDICAL HISTORY:  Hypertension, gout, Parkinson's disease.   PAST SURGICAL HISTORY:  Cholecystectomy in 1997, diverticulosis and  diverticulitis in 2004, and bowel obstruction in 1998 managed medically.   MEDICATIONS:  1.  Requip 0.5 mg p.o. t.i.d.  2.  Allopurinol 100 mg daily.  3.  Fosinopril 40 mg daily.  4.  KCl 20 mEq daily.  5.  Micardis HCT 80/125 daily.  6.  Flomax 0.4 mg daily.  7.  Vesicare 5 mg daily.  8.  Phenameth CR q.i.d.  9.  Lasix 20 mg daily.  10. Currently on Lovenox per sepsis protocol.   ALLERGIES:  NO KNOWN.   SOCIAL HISTORY:  Negative ETOH, negative tobacco.   REVIEW OF SYSTEMS:  As above.  Otherwise benign.   PHYSICAL EXAMINATION:  A 77 year old [place] male in no acute distress.  VITAL SIGNS:  Temperature  maximum 100 degrees Fahrenheit.  Hypertension,  currently  152/90.  Otherwise vital signs are stable.  ABDOMEN:  Soft, mildly tender.  J-P drain intact.  Dressing intact.  GENITOURINARY:  Uncircumcised male with bleeding from meatus.  Foley  catheter out with gross hematuria in bag.  LOWER EXTREMITIES:  Mild edema.   LABORATORY STUDIES:  Negative urine culture.  White count 8.2, hemoglobin  11.5, hematocrit 34.4, and platelets 215.  Sodium 135, potassium 3.5,  chloride 105, CO2 23, BUN 34, creatinine 2.1 and glucose 150.   IMPRESSION:  1.  Gross hematuria from traumatic Foley removal.  2.  Status post emergent appendectomy.  3.  VDRF.  4.  ARF.  5.  Gout.   PLAN:  A 22-French coude placed without difficulty, irrigated well with  return to clear urine, clot times one.  Meatal bleeding decreased with 4 x 4  pressure to meatus.  RN may irrigate as needed.  Hold Lovenox times 24 hours  if okay with critical care medicine.  The bleeding should resolve over 24  hours.  Continue surgical and medical management as previously documented.   Thank you.  Do not hesitate to call if any further needs.     ______________________________  Alessandra Bevels. Chase Picket, FNP-C      Ronald L. Earlene Plater, M.D.  Electronically Signed    JML/MEDQ  D:  12/25/2005  T:  12/26/2005  Job:  [id number]

## 2011-04-29 NOTE — H&P (Signed)
Walter Gill, Walter [name]:  [id number]   MEDICAL RECORD NO.:  [id number]          PATIENT TYPE:  INP   LOCATION:  2105                         FACILITY:  [facility]   PHYSICIAN:  Gordy Savers, M.D. LHCDATE OF BIRTH:  1934-01-31   DATE OF ADMISSION:  12/22/2005  DATE OF DISCHARGE:                                HISTORY & PHYSICAL   CHIEF COMPLAINT:  Abdominal pain.   HISTORY OF PRESENT ILLNESS:  The patient is a 77 year old gentleman who was  stable until 3 days prior to admission.  At that time, he developed pain  more in the mid abdominal area.  This has become more severe in the right  lower quadrant and has been associated with anorexia and worsening pain.  He  denies any nausea and vomiting, but has not had a bowel movement in  approximately 3 days.  He has become febrile with increasing weakness.  He  does have a prior history of a cholecystectomy as well as moderate severe  diverticular disease.  Also in March of 1998, he was hospitalized for a  small bowel obstruction that was successfully treated medically.  The  patient is now admitted for further evaluation and treatment of his  worsening abdominal pain.   PAST MEDICAL HISTORY:  As mentioned, the patient has a history of prior  cholecystectomy and small bowel obstruction.  He has a long history of gout  and hypertension.  He has been followed by Gustavus Messing. Orlin Hilding, M.D. for  Parkinson's disease.  He has also been followed by Dr. Arlyce Dice and has had  periodic colonoscopy for colorectal surveillance.   MEDICATIONS:  1.  Requip 0.5 mg t.i.d.  2.  Phenameth CR 50-200 q.i.d.  3.  Allopurinol 100 mg daily.  4.  Monopril 40 mg daily.  5.  Potassium chloride 20 mEq daily.  6.  Lasix 20 mg daily.  7.  Micardis hydrocortisone 80/12.5 daily.  8.  Flomax 0.4 mg daily.  9.  Aspirin 325 mg daily.  10. Vesicare 5 mg daily.   FAMILY HISTORY:  Father is deceased due to cancer of unclear type at age 15.  Mother died of an MI at age 68.  Sister had an MI at age 24.  Three brothers  who are generally fairly healthy.   REVIEW OF SYSTEMS:  Otherwise fairly unremarkable.  The patient has had a  Cardiolite stress test that was negative in October of 2003.  He has also  had carotid artery Duplex scanning in the past that has been negative.   PHYSICAL EXAMINATION:  GENERAL:  Elderly white male, obese, who was in mild  painful distress.  VITAL SIGNS:  Temperature 99.3, blood pressure 130/70.  SKIN:  Warm and dry without rash.  HEENT:  Normal pupil responses.  Conjunctivae clear.  ENT unremarkable.  NECK:  No neck vein distention.  CHEST:  Clear.  HEART:  Regular rhythm without murmur.  ABDOMEN:  Obese, appeared slightly distended.  Did have active bowel sounds.  Did have tenderness in both lower quadrants, right greater than left.  Rebound tenderness was noted.  RECTAL:  Slightly enlarged prostate.  Did have a large amount of formed firm  stool in the rectal vault.  It was Hematest negative.  EXTREMITIES:  +2 peripheral edema.   IMPRESSION:  Abdominal pain with peritoneal findings, rule out acute  appendicitis, rule out acute diverticulitis.   DISPOSITION:  The patient will be admitted to the hospital, placed on IV  fluids, and made NPO.  A stat CT abdominal scan to rule out appendicitis  protocol will be instituted.  He will be placed on parenteral antibiotics  and pain control.  If acute appendicitis is documented, he will be seen in  consultation by general surgery today.           ______________________________  Gordy Savers, M.D. [facility]     PFK/MEDQ  D:  12/22/2005  T:  12/23/2005  Job:  [id number]

## 2011-04-29 NOTE — Discharge Summary (Signed)
NAMEMORRIE, DAYWALT NO.:  [id number]   MEDICAL RECORD NO.:  [id number]          PATIENT TYPE:  INP   LOCATION:  5511                         FACILITY:  [facility]   PHYSICIAN:  Rene Paci, M.D. LHCDATE OF BIRTH:  04-09-1934   DATE OF ADMISSION:  12/22/2005  DATE OF DISCHARGE:  12/30/2005                                 DISCHARGE SUMMARY   DISCHARGE DIAGNOSES:  1.  Status post ruptured appendix, status post laparoscopic appendectomy on      December 22, 2005, by Dr. Gabrielle Dare. Janee Morn.  2.  Early sepsis, secondary to above, 48-hour vent-dependent respiratory      failure, but now medically recovered and normal.  3.  Urinary retention, secondary to benign prostatic hypertrophy and Foley      trauma.  Continue home urologic medications.  Spontaneous voiding done      post discontinuation of Foley this a.m.  See below.  4.  Acute renal insufficiency, secondary to acute tubular necrosis and      sepsis.  Creatinine normalized to admission level.  Discharge creatinine      1.5.  5.  History of Parkinson's disease, controlled on home medications.  6.  History of hypertension, has been normotensive postoperatively.  Hold      Lisinopril, Micardis and potassium until seem and reviewed by his      primary medical doctor.  7.  Hyperglycemia with normal A1c at 5.6, as exacerbated by above.      Outpatient followup.  8.  Acute blood loss anemia postoperatively from above, plus/minus      hematuria.  Discharge hemoglobin 11.   DISCHARGE MEDICATIONS:  NOTATION:  Holding Lisinopril, Micardis,  hydrochlorothiazide and K-Dur, but otherwise may continue his other  medications as before, including  1.  Sinemet CR 50/200 mg q.i.d.  2.  Requip 0.5 mg t.i.d.  3.  Vesicare 5 mg p.o. daily.  4.  Flomax 0.4 mg p.o. daily.  5.  Aspirin 325 mg p.o. daily.  6.  Multivitamin, vitamin C, vitamin E and glucosamine chondroitin      supplements as prior to admission.   FOLLOWUP:  1.   Scheduled first with his primary care physician, Dr. Valetta Mole. Swords for      next week, on Wednesday, January 04, 2006, at 11:45 a.m.  2.  The patient also instructed to call the surgeon, Dr. Janee Morn, in      [facility], in one to two weeks post-discharge for followup.  3.  May also call his urologist, Dr. Loraine Leriche C. Ottelin, for prostate followup      in about two weeks or as needed.  Numbers are provided for each of each      of these other offices.   CONDITION ON DISCHARGE:  Medically improved and stable, tolerating a regular  diet.  No pain.  Having regular bowel movements.  Have reviewed all plans  for medications at discharge and followup.   CONSULTATIONS:  1.  General surgery, Dr. Janee Morn.  2.  Critical care medicine, [facility].  3.  Urology, Dr. Windy Fast L. Earlene Plater,  on behalf of Dr. Barbera Setters COURSE:  #1 - RUPTURED APPENDIX:  The patient is a pleasant 77-year-  old gentleman with a history of Parkinson's and hypertension, as well as  benign prostatic hypertrophy, who presented to his primary care physician's  office and was seen by his partner, Dr. Gordy Savers, with severe  abdominal pain.  Due to the nature of his story, there was concern for  appendicitis.  Please see the H&P  by Dr. __________ with full details on  this.  Once at the hospital the CT did reveal a ruptured appendix which was  related to the primary M.D. who contacted general surgery.  The patient was  taken for an emergent lap appendectomy on the evening of December 22, 2005,  without apparent complications; however, the following morning he was felt  to have changes of early sepsis as he remained on the ventilator.  Thus  critical care was called.  He was continued on vent support for 48 hours,  until it became clear that the patient was recovering with normalized renal  function, normotensive pressures and defervescence of his fever with  antibiotics postoperatively.  He was  successfully extubated on December 25, 2005, and resumed primary service by his primary care on December 27, 2005.  All antibiotics were discontinued after a seven-day treatment, as his white  count had normalized.  He remained hemodynamically stable and there were no  other complications.  Mild postoperative ileus resolved quickly and he is  now tolerating a regular diet without pain or vomiting and good bowel  movements.  His only other significant postoperative complication had to do  with the prostate.  See the problem below.  At this time the patient is felt  stable for discharge with outpatient followup by his primary M.D. and  general surgery as scheduled above.   #2 - BENIGN PROSTATIC HYPERTROPHY WITH FOLEY TRAUMA:  During the course of  his intensive care unit stay, the patient experienced Foley trauma and  subsequent obstruction and inability to void because of the difficulty in  replacing the Foley. A urology consultation was obtained, and a Foley was  replaced by Dr. Earlene Plater.  The Foley discontinuation was attempted, but created  spasm and obstruction due to clots.  It was thus reinserted again to relieve  obstruction.  His Flomax had initially been held because of mild hypotension  and low normal pressures since his surgery.  On the day prior to  discontinuation of his Foley he is given a low-dose of Proscar and it is  intended that the Foley will be removed this morning and should the patient  have spontaneous voiding, it is safe to return home, to continue his Flomax  as well as Vesicare for spasms, as taken prior to admission.  If there is  difficulty with voiding post Foley removal, it is anticipated a urology  consultation will be needed to be re-obtained and possibly home with Foley  for outpatient followup and repeat voiding trial as an outpatient.  All  other medical issues remained stable.   #3 - HISTORY OF HYPERTENSION:  As stated above, the patient's blood  pressure has remained low normal postoperatively, ranging systolically from 100 to  110.  This is off of his usual blood pressure medications, and as such his  usual blood pressure medications have been held until followup with his  primary M.D. and resumption of elevated pressures.  This will be deferred to  his  primary M.D., to resume at followup as needed.   #4 - HISTORY OF PARKINSON'S:  The patient was continued on his home  medications without difficulty.  No exacerbation in his neurologic symptoms.   #5 - ACUTE BLOOD LOSS ANEMIA:  The patient experienced a drop in his  hemoglobin from the time of admission to postoperatively; however, he  remains unstable in the 11 range with no signs or symptoms of continued  bleed.  Outpatient followup as needed.      Rene Paci, M.D. [facility]  Electronically Signed     VL/MEDQ  D:  12/30/2005  T:  12/30/2005  Job:  [id number]

## 2011-05-04 ENCOUNTER — Encounter: Payer: Self-pay | Admitting: Internal Medicine

## 2011-05-04 ENCOUNTER — Ambulatory Visit: Payer: Medicare Other | Admitting: Internal Medicine

## 2011-05-05 ENCOUNTER — Ambulatory Visit (INDEPENDENT_AMBULATORY_CARE_PROVIDER_SITE_OTHER): Payer: Medicare Other | Admitting: Internal Medicine

## 2011-05-05 ENCOUNTER — Encounter: Payer: Self-pay | Admitting: Internal Medicine

## 2011-05-05 DIAGNOSIS — I2699 Other pulmonary embolism without acute cor pulmonale: Secondary | ICD-10-CM

## 2011-05-05 DIAGNOSIS — I749 Embolism and thrombosis of unspecified artery: Secondary | ICD-10-CM

## 2011-05-05 DIAGNOSIS — Z23 Encounter for immunization: Secondary | ICD-10-CM

## 2011-05-05 DIAGNOSIS — I10 Essential (primary) hypertension: Secondary | ICD-10-CM

## 2011-05-05 LAB — HEPATIC FUNCTION PANEL
AST: 26 U/L (ref 0–37)
Albumin: 3.9 g/dL (ref 3.5–5.2)
Alkaline Phosphatase: 67 U/L (ref 39–117)
Total Protein: 6.8 g/dL (ref 6.0–8.3)

## 2011-05-05 LAB — CBC WITH DIFFERENTIAL/PLATELET
Basophils Absolute: 0 10*3/uL (ref 0.0–0.1)
Basophils Relative: 0.5 % (ref 0.0–3.0)
Eosinophils Absolute: 0.1 10*3/uL (ref 0.0–0.7)
HCT: 39.6 % (ref 39.0–52.0)
Hemoglobin: 13.2 g/dL (ref 13.0–17.0)
Lymphs Abs: 1.2 10*3/uL (ref 0.7–4.0)
MCHC: 33.4 g/dL (ref 30.0–36.0)
MCV: 83.7 fl (ref 78.0–100.0)
Neutro Abs: 3.4 10*3/uL (ref 1.4–7.7)
RBC: 4.73 Mil/uL (ref 4.22–5.81)
RDW: 15.1 % — ABNORMAL HIGH (ref 11.5–14.6)

## 2011-05-05 LAB — BASIC METABOLIC PANEL
CO2: 29 mEq/L (ref 19–32)
Chloride: 104 mEq/L (ref 96–112)
Glucose, Bld: 101 mg/dL — ABNORMAL HIGH (ref 70–99)
Potassium: 4.2 mEq/L (ref 3.5–5.1)
Sodium: 142 mEq/L (ref 135–145)

## 2011-05-05 LAB — POCT INR: INR: 2.3

## 2011-05-05 MED ORDER — CLOTRIMAZOLE-BETAMETHASONE 1-0.05 % EX CREA: 1.0000 "application " | TOPICAL_CREAM | Freq: Two times a day (BID) | CUTANEOUS | Status: DC

## 2011-05-05 MED ORDER — TAMSULOSIN HCL 0.4 MG PO CAPS: 0.4000 mg | ORAL_CAPSULE | Freq: Every day | ORAL | Status: DC

## 2011-05-05 NOTE — Progress Notes (Signed)
Addended byAlfred Levins on: 05/05/2011 10:36 AM   Modules accepted: Orders

## 2011-05-05 NOTE — Progress Notes (Signed)
  Subjective:    Patient ID: Walter Gill, male    DOB: 1934/06/22, 77 y.o.   MRN: [id number]  HPI\  Pt here with wife--- htn---tolerating meds  Bladder neck obstruction---seeing urology  Diastolic dysfunction---chronic LE edema  Gout--no recurrence  Past Medical History  Diagnosis Date  . Sleep apnea   . RBBB (right bundle branch block)   . Parkinson disease   . Gout   . Hyperlipidemia   . Hypertension   . OA (osteoarthritis)   . PE (pulmonary embolism) september 2008  . DVT (deep venous thrombosis)   . Left ventricular dysfunction     impaired LV relaxation   Past Surgical History  Procedure Date  . Appendectomy   . Cholecystectomy   . Dupruytens contracture   . Total hip arthroplasty 9/8/8    reports that he has never smoked. He does not have any smokeless tobacco history on file. He reports that he does not drink alcohol or use illicit drugs. family history includes Cancer in his father and Heart disease in his mother. No Known Allergies   Review of Systems  patient denies chest pain, shortness of breath, orthopnea. Denies lower extremity edema, abdominal pain, change in appetite, change in bowel movements. Patient denies rashes, musculoskeletal complaints. No other specific complaints in a complete review of systems.      Objective:   Physical Exam  well-developed well-nourished male in no acute distress. HEENT exam atraumatic, normocephalic, neck supple without jugular venous distention. Chest clear to auscultation cardiac exam S1-S2 are regular. Abdominal exam overweight with bowel sounds, soft and nontender. Extremities no edema. Neurologic exam is alert. Using cane for ambulation        Assessment & Plan:

## 2011-05-05 NOTE — Assessment & Plan Note (Signed)
Controlled Continue meds 

## 2011-05-05 NOTE — Assessment & Plan Note (Signed)
Chronic anticoagulation  protime today

## 2011-05-05 NOTE — Patient Instructions (Signed)
Same dose 

## 2011-05-16 ENCOUNTER — Ambulatory Visit: Payer: Medicare Other

## 2011-06-03 ENCOUNTER — Other Ambulatory Visit: Payer: Self-pay | Admitting: *Deleted

## 2011-06-03 MED ORDER — FUROSEMIDE 40 MG PO TABS: 40.0000 mg | ORAL_TABLET | Freq: Every day | ORAL | Status: DC

## 2011-06-06 ENCOUNTER — Ambulatory Visit (INDEPENDENT_AMBULATORY_CARE_PROVIDER_SITE_OTHER): Payer: Medicare Other | Admitting: Internal Medicine

## 2011-06-06 DIAGNOSIS — I749 Embolism and thrombosis of unspecified artery: Secondary | ICD-10-CM

## 2011-06-06 LAB — POCT INR: INR: 2.7

## 2011-06-06 NOTE — Patient Instructions (Signed)
Same dose of coumadin 

## 2011-06-07 ENCOUNTER — Ambulatory Visit: Payer: Medicare Other

## 2011-07-04 ENCOUNTER — Ambulatory Visit: Payer: Medicare Other | Admitting: Internal Medicine

## 2011-07-04 DIAGNOSIS — I749 Embolism and thrombosis of unspecified artery: Secondary | ICD-10-CM

## 2011-07-04 DIAGNOSIS — Z7901 Long term (current) use of anticoagulants: Secondary | ICD-10-CM

## 2011-07-04 NOTE — Patient Instructions (Signed)
Same Dose?

## 2011-07-11 ENCOUNTER — Other Ambulatory Visit: Payer: Self-pay | Admitting: *Deleted

## 2011-07-11 MED ORDER — WARFARIN SODIUM 5 MG PO TABS: 10.0000 mg | ORAL_TABLET | Freq: Every day | ORAL | Status: DC

## 2011-07-18 ENCOUNTER — Ambulatory Visit: Payer: Medicare Other | Admitting: Internal Medicine

## 2011-07-18 DIAGNOSIS — I749 Embolism and thrombosis of unspecified artery: Secondary | ICD-10-CM

## 2011-07-18 DIAGNOSIS — I2699 Other pulmonary embolism without acute cor pulmonale: Secondary | ICD-10-CM

## 2011-07-18 LAB — POCT INR: INR: 2.1

## 2011-07-18 NOTE — Patient Instructions (Signed)
Same Dose 7.5 mg on mondays and thursdays 7 mg on other days,check in 4 weeks  

## 2011-08-17 ENCOUNTER — Ambulatory Visit (INDEPENDENT_AMBULATORY_CARE_PROVIDER_SITE_OTHER): Payer: Medicare Other

## 2011-08-17 DIAGNOSIS — I749 Embolism and thrombosis of unspecified artery: Secondary | ICD-10-CM

## 2011-08-17 LAB — POCT INR: INR: 2.1

## 2011-08-17 NOTE — Patient Instructions (Signed)
Same Dose 7.5 mg on mondays and thursdays 7 mg on other days,check in 4 weeks

## 2011-08-31 ENCOUNTER — Encounter: Payer: Self-pay | Admitting: Neurology

## 2011-08-31 ENCOUNTER — Ambulatory Visit (INDEPENDENT_AMBULATORY_CARE_PROVIDER_SITE_OTHER): Payer: Medicare Other | Admitting: Internal Medicine

## 2011-08-31 ENCOUNTER — Encounter: Payer: Self-pay | Admitting: Internal Medicine

## 2011-08-31 DIAGNOSIS — I2699 Other pulmonary embolism without acute cor pulmonale: Secondary | ICD-10-CM

## 2011-08-31 DIAGNOSIS — M109 Gout, unspecified: Secondary | ICD-10-CM

## 2011-08-31 DIAGNOSIS — I10 Essential (primary) hypertension: Secondary | ICD-10-CM

## 2011-08-31 DIAGNOSIS — I749 Embolism and thrombosis of unspecified artery: Secondary | ICD-10-CM

## 2011-08-31 DIAGNOSIS — E785 Hyperlipidemia, unspecified: Secondary | ICD-10-CM

## 2011-08-31 DIAGNOSIS — R269 Unspecified abnormalities of gait and mobility: Secondary | ICD-10-CM

## 2011-08-31 DIAGNOSIS — G20 Parkinson's disease: Secondary | ICD-10-CM

## 2011-08-31 DIAGNOSIS — R609 Edema, unspecified: Secondary | ICD-10-CM

## 2011-08-31 LAB — HEPATIC FUNCTION PANEL
ALT: 12 U/L (ref 0–53)
Bilirubin, Direct: 0.1 mg/dL (ref 0.0–0.3)
Total Bilirubin: 0.7 mg/dL (ref 0.3–1.2)

## 2011-08-31 LAB — LIPID PANEL
HDL: 40.6 mg/dL (ref >39.00)
Total CHOL/HDL Ratio: 2
VLDL: 21.8 mg/dL (ref 0.0–40.0)

## 2011-08-31 NOTE — Assessment & Plan Note (Signed)
No known recurrence---long term warfarin use No bleeding complications  Reviewed CT 2008

## 2011-08-31 NOTE — Patient Instructions (Signed)
Coumadin dose-5mg . Monday and Thursday all other days 7.5mg . Check in 4 weeks

## 2011-08-31 NOTE — Progress Notes (Signed)
  Subjective:    Patient ID: Walter Gill, male    DOB: 06-22-1934, 77 y.o.   MRN: [id number]  HPI  Patient Active Problem List  Diagnoses  . HYPERLIPIDEMIA---needs followup labs  . GOUT--no recurrence  . PARKINSON'S DISEASE---continues to have trouble with gait and station:  "always leaning to the right" . Would like second opinion  . HYPERTENSION---tolerating meds  . PULMONARY EMBOLISM---long term warfarin use  .   .   .   .   .   .   Marland Kitchen    Past Medical History  Diagnosis Date  . Sleep apnea   . RBBB (right bundle branch block)   . Parkinson disease   . Gout   . Hyperlipidemia   . Hypertension   . OA (osteoarthritis)   . PE (pulmonary embolism) september 2008  . DVT (deep venous thrombosis)   . Left ventricular dysfunction     impaired LV relaxation   Past Surgical History  Procedure Date  . Appendectomy   . Cholecystectomy   . Dupruytens contracture   . Total hip arthroplasty 9/8/8    reports that he has never smoked. He does not have any smokeless tobacco history on file. He reports that he does not drink alcohol or use illicit drugs. family history includes Cancer in his father and Heart disease in his mother. No Known Allergies    Review of Systems  patient denies chest pain, shortness of breath, orthopnea. Denies lower extremity edema, abdominal pain, change in appetite, change in bowel movements. Patient denies rashes, musculoskeletal complaints. No other specific complaints in a complete review of systems.      Objective:   Physical Exam  well-developed well-nourished male in no acute distress. HEENT exam atraumatic, normocephalic, neck supple without jugular venous distention. Chest clear to auscultation cardiac exam S1-S2 are regular. Abdominal exam overweight with bowel sounds, soft and nontender. Extremities no edema. Neurologic exam: he is alert, leaning to right.         Assessment & Plan:

## 2011-08-31 NOTE — Assessment & Plan Note (Signed)
Controlled with lower extremity compressive stockings

## 2011-08-31 NOTE — Assessment & Plan Note (Signed)
He leans to the right with walking and sitting Suspect related to parkinsons.

## 2011-08-31 NOTE — Assessment & Plan Note (Signed)
Previously controlled Check labs today 

## 2011-08-31 NOTE — Assessment & Plan Note (Signed)
No recurrence on allopurinol

## 2011-08-31 NOTE — Assessment & Plan Note (Signed)
Has struggled with dzs for quite some time He is interested in another opinion---he has trouble with ambulating---leans to right  Will have him see neurology---have told him and his wife not to expect a cure

## 2011-08-31 NOTE — Assessment & Plan Note (Signed)
Fair control; continue meds 

## 2011-09-08 ENCOUNTER — Ambulatory Visit: Payer: Medicare Other

## 2011-09-12 ENCOUNTER — Encounter: Payer: Self-pay | Admitting: Neurology

## 2011-09-12 ENCOUNTER — Ambulatory Visit (INDEPENDENT_AMBULATORY_CARE_PROVIDER_SITE_OTHER): Payer: Medicare Other | Admitting: Neurology

## 2011-09-12 VITALS — BP 128/78 | HR 84 | Wt 248.0 lb

## 2011-09-12 DIAGNOSIS — G20 Parkinson's disease: Secondary | ICD-10-CM

## 2011-09-12 NOTE — Progress Notes (Signed)
Dear Dr. Cato Mulligan,  Thank you for having me see Walter Gill in consultation today at [facility] for his problem with probable Parkinson's disease.  As you may recall, he is a 77 y.o. year old male with a history of hyperlipidemia, hypertension who in 2000 began having problems with worsening handwriting, slowness of movement and decreased facial expression.  He has never suffered from a significant tremor.  He said he was placed on "several medications" until they hit on his current combination of Sinemet CR, ropinirole and selegiline.    He continues to have problems with stiffness of movement.  He feels his right side is worse than his left.  He does not have a tremor.  He does not notice any peak dose phenomenon.  He does act out his dreams and has had to stop sleeping in the same bed as his wife.  He denies hallucinations or significant memory problems.    He denies wearing off of his medications, that is, if he misses his meds which is common for him to be late, he does not notice a difference in his rigidity.  His chief complaint is his leaning to one side.  He typically feels himself pulled to the right at the waist.  He feels this is worse when he wakes up, when he has been off his medication.  Botox has never been attempted for what he terms a "dystonia".  He denies dystonia of the hands or feet or face, but does seem to have flexion of the head to the right.    He endorses difficulty having an erection for about 2 years.  He has begun to have urinary incontinence for about the same time.  He gets dizzy on rare occasions when he stands up.    Medical History: Past Medical History  Diagnosis Date  . Sleep apnea   . RBBB (right bundle branch block)   . Parkinson disease   . Gout   . Hyperlipidemia   . Hypertension   . OA (osteoarthritis)   . PE (pulmonary embolism) september 2008  . DVT (deep venous thrombosis)   . Left ventricular dysfunction     impaired LV relaxation     Past Surgical History  Procedure Date  . Appendectomy   . Cholecystectomy   . Dupruytens contracture   . Total hip arthroplasty 9/8/8    History   Social History  . Marital Status: Married    Spouse Name: N/A    Number of Children: N/A  . Years of Education: N/A   Social History Main Topics  . Smoking status: Never Smoker   . Smokeless tobacco: Never Used  . Alcohol Use: No  . Drug Use: No  . Sexually Active: None   Other Topics Concern  . None   Social History Narrative  . None     Family History: No history of neurologic disease or Parkinson's disease.   Medications 1.  Sinemet CR 200/50 7/11/3/7 2.  ropinirole 5mg  7/11/3/7 3.  Selegiline 5mg  bid.  ROS:  13 systems were reviewed and are notable for chronic hip pain, as well as a "jerking" in his arm that you cannot see, but he can feel..  All other review of systems are unremarkable.   Examination:  Filed Vitals:   09/12/11 1412  BP: 128/78  Pulse: 84  Weight: 248 lb (112.492 kg)     In general, well nourished appearing older gentleman with obvious pleurothotonus to the right.  Cardiovascular: The  patient has a regular rate and rhythm.  Fundoscopy:  Difficult to assess disks - very constricted pupils.  Mental status:   The patient is oriented to person, place and time. Recent and remote memory are intact. Attention span and concentration are normal. Language including repetition, naming, following commands are intact. Fund of knowledge of current and historical events, as well as vocabulary are normal.  Cranial Nerves: Irregular, small and poorly reactive(post surgical). Visual fields full to confrontation. Extraocular movements are intact without nystagmus. Obvious decreased blink, blunting of facial expression. Facial sensation and muscles of mastication are intact. Muscles of facial expression are symmetric. Hearing intact to bilateral finger rub. Tongue protrusion, uvula, palate midline.   Shoulder shrug intact  Motor:  The patient has mild rigidity bilaterally, worse on right.  Decreased amplitude of fine finger movements.  No resting tremor.  Has lateral flexion of neck to right.   Pronator drift and 5/5 strength bilaterally.  There are no adventitious movements.  Reflexes:  Are quiet throughout. Toes down  Coordination:  Normal finger to nose.  No dysdiadokinesia.  Sensation is intact to scratch bilaterally.  Gait and station are slow, slightly wide based, rightward lateral  flexion of trunk.    Romberg is negative  Back exam reveals activation of contralateral paraspinal muscles on left, likely to compensate for lateral flexion of trunk.  MRI Brain from 2008 reviewed and did not reveal putaminal rim hyperintensity, hot cross bun sign or hummingbird sign.   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  I think another synucleionopathy is less likely(such as multiple system atrophy) given its course.  It appears that his Pisa Syndrome is an "off" phenomenon, as it is worse in the a.m.   Recommendations:  1.  Parkinson's - I am going to move his meds to 5/11/3/7 as he doesn't seem to notice even a 6 hour gap between doses, but does notice when he awakes in the a.m and has not taken his meds for 12 hours.  I am hoping that the earlier dose makes his truncal dystonia better in the early hours. 2.  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  I would like to look into whether chemodenervation might be helpful here. 3.  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  Usually treated with clonazepam to suppress REM sleep but it does not seem to be causing major problems at this time and given his OSA I would rather not do this.  It should be noted that night time dopa agonists seem to worsen this so we will need to avoid later dosing.   We will see the patient  back in 4 weeks.  Thank you for having [place] see Walter Gill in consultation.  Feel free to contact me with any questions.  [name] Modesto Charon, MD [facility], [place] [address] [place], [place] [address] Phone: [phone] Fax: [phone].

## 2011-09-13 ENCOUNTER — Telehealth: Payer: Self-pay | Admitting: Neurology

## 2011-09-13 NOTE — Telephone Encounter (Signed)
Forwarded to Dr. Wong for review.  °

## 2011-09-21 LAB — CBC
HCT: 38.3 — ABNORMAL LOW
HCT: 38.5 — ABNORMAL LOW
Hemoglobin: 12.6 — ABNORMAL LOW
MCHC: 32.7
MCV: 81.2
MCV: 81.4
Platelets: 165
Platelets: 179
Platelets: 181
RBC: 4.74
RDW: 15.7 — ABNORMAL HIGH
RDW: 16.1 — ABNORMAL HIGH
WBC: 4.8
WBC: 5.9
WBC: 6.1

## 2011-09-21 LAB — BASIC METABOLIC PANEL
BUN: 17
BUN: 20
CO2: 28
CO2: 28
CO2: 29
Calcium: 9.1
Calcium: 9.1
Calcium: 9.2
Calcium: 9.6
Creatinine, Ser: 1.07
GFR calc Af Amer: >60
GFR calc non Af Amer: >60
GFR calc non Af Amer: >60
Glucose, Bld: 116 — ABNORMAL HIGH
Glucose, Bld: 121 — ABNORMAL HIGH
Glucose, Bld: 134 — ABNORMAL HIGH
Potassium: 3.4 — ABNORMAL LOW
Sodium: 137
Sodium: 139

## 2011-09-21 LAB — CK TOTAL AND CKMB (NOT AT ARMC)
CK, MB: 4.1 — ABNORMAL HIGH
Relative Index: INVALID
Total CK: 79

## 2011-09-21 LAB — PROTIME-INR
INR: 1.1
INR: 1.1
Prothrombin Time: 14.1

## 2011-09-21 LAB — DIFFERENTIAL
Basophils Absolute: 0
Basophils Relative: 0
Eosinophils Absolute: 0.1
Eosinophils Relative: 2
Lymphocytes Relative: 17
Lymphs Abs: 1
Monocytes Absolute: 0.5
Monocytes Relative: 9
Neutro Abs: 4.2
Neutrophils Relative %: 72

## 2011-09-21 LAB — BASIC METABOLIC PANEL WITH GFR
Chloride: 100
Creatinine, Ser: 0.87
GFR calc Af Amer: >60
GFR calc non Af Amer: >60

## 2011-09-21 LAB — TROPONIN I: Troponin I: 0.42 — ABNORMAL HIGH

## 2011-09-21 LAB — CARDIAC PANEL(CRET KIN+CKTOT+MB+TROPI)
CK, MB: 1.4
Total CK: 43
Troponin I: 0.21 — ABNORMAL HIGH

## 2011-09-21 LAB — APTT: aPTT: 31

## 2011-09-21 LAB — B-NATRIURETIC PEPTIDE (CONVERTED LAB): Pro B Natriuretic peptide (BNP): 269 — ABNORMAL HIGH

## 2011-09-21 LAB — D-DIMER, QUANTITATIVE: D-Dimer, Quant: 8.16 — ABNORMAL HIGH

## 2011-09-21 LAB — POCT CARDIAC MARKERS: Myoglobin, poc: 127

## 2011-09-23 LAB — CROSSMATCH

## 2011-09-23 LAB — BASIC METABOLIC PANEL
BUN: 11
BUN: 12
BUN: 14
CO2: 26
CO2: 27
CO2: 29
Chloride: 101
Chloride: 103
Creatinine, Ser: 1.01
Creatinine, Ser: 1.05
Glucose, Bld: 155 — ABNORMAL HIGH
Glucose, Bld: 178 — ABNORMAL HIGH
Potassium: 3.7
Potassium: 4
Sodium: 135

## 2011-09-23 LAB — CBC
HCT: 31.4 — ABNORMAL LOW
HCT: 32.3 — ABNORMAL LOW
HCT: 42.5
Hemoglobin: 10.7 — ABNORMAL LOW
Hemoglobin: 14.5
MCHC: 34
MCHC: 34.1
MCHC: 34.5
MCV: 81.4
MCV: 81.9
MCV: 82
MCV: 82.3
Platelets: 133 — ABNORMAL LOW
Platelets: 137 — ABNORMAL LOW
Platelets: 139 — ABNORMAL LOW
Platelets: 168
RBC: 3.59 — ABNORMAL LOW
RBC: 3.96 — ABNORMAL LOW
RBC: 5.19
RDW: 14.6 — ABNORMAL HIGH
RDW: 14.7 — ABNORMAL HIGH
WBC: 4.7
WBC: 5.3

## 2011-09-23 LAB — URINALYSIS, ROUTINE W REFLEX MICROSCOPIC
Bilirubin Urine: NEGATIVE
Glucose, UA: NEGATIVE
Hgb urine dipstick: NEGATIVE
Hgb urine dipstick: NEGATIVE
Ketones, ur: NEGATIVE
Protein, ur: NEGATIVE
Specific Gravity, Urine: 1.015
Urobilinogen, UA: 0.2
pH: 6

## 2011-09-23 LAB — COMPREHENSIVE METABOLIC PANEL
Alkaline Phosphatase: 90
BUN: 21
CO2: 35 — ABNORMAL HIGH
Chloride: 100
Creatinine, Ser: 0.72
GFR calc non Af Amer: >60
Glucose, Bld: 136 — ABNORMAL HIGH
Total Bilirubin: 0.7

## 2011-09-23 LAB — PROTIME-INR
INR: 1
Prothrombin Time: 13.4
Prothrombin Time: 17.8 — ABNORMAL HIGH
Prothrombin Time: 18 — ABNORMAL HIGH

## 2011-09-23 LAB — APTT: aPTT: 31

## 2011-09-23 LAB — URINE CULTURE: Special Requests: NEGATIVE

## 2011-09-30 ENCOUNTER — Ambulatory Visit: Payer: Medicare Other

## 2011-10-03 ENCOUNTER — Ambulatory Visit: Payer: Medicare Other | Admitting: Internal Medicine

## 2011-10-03 DIAGNOSIS — I749 Embolism and thrombosis of unspecified artery: Secondary | ICD-10-CM

## 2011-10-03 DIAGNOSIS — Z7901 Long term (current) use of anticoagulants: Secondary | ICD-10-CM

## 2011-10-03 NOTE — Patient Instructions (Signed)
°  Latest dosing instructions  ° Total Sun Mon Tue Wed Thu Fri Sat  ° 47.5 7.5 mg 5 mg 7.5 mg 7.5 mg 5 mg 7.5 mg 7.5 mg  °  (5 mg×1.5) (5 mg×1) (5 mg×1.5) (5 mg×1.5) (5 mg×1) (5 mg×1.5) (5 mg×1.5)  °  °  ° ° °

## 2011-10-21 ENCOUNTER — Other Ambulatory Visit: Payer: Self-pay | Admitting: *Deleted

## 2011-10-21 MED ORDER — TAMSULOSIN HCL 0.4 MG PO CAPS: 0.4000 mg | ORAL_CAPSULE | Freq: Every day | ORAL | Status: DC

## 2011-10-28 ENCOUNTER — Ambulatory Visit (INDEPENDENT_AMBULATORY_CARE_PROVIDER_SITE_OTHER): Payer: Medicare Other | Admitting: Neurology

## 2011-10-28 ENCOUNTER — Encounter: Payer: Self-pay | Admitting: Neurology

## 2011-10-28 VITALS — BP 118/78 | HR 88 | Wt 248.0 lb

## 2011-10-28 DIAGNOSIS — G20 Parkinson's disease: Secondary | ICD-10-CM

## 2011-10-28 MED ORDER — TRIHEXYPHENIDYL HCL 2 MG PO TABS: ORAL_TABLET | ORAL | Status: DC

## 2011-10-28 MED ORDER — SELEGILINE HCL 5 MG PO CAPS: 5.0000 mg | ORAL_CAPSULE | Freq: Two times a day (BID) | ORAL | Status: DC

## 2011-10-28 NOTE — Progress Notes (Signed)
  Dear Dr. Cato Mulligan,  I saw  Walter Gill back in [facility] for his problem with Parkinson's disease with Pisa syndrome.  At his first clinic visit, I felt his Parkinson's symptoms were well controlled except for his truncal dystonia.  It seemed to be worse on awakening, so I suggested he move his a.m. dose of his ropinorole and Sinemet to an earlier time.    He feels that his truncal dystonia may be better in the am, because of this change.  However, he also thinks that it is better during the day because of this change.  He continues to notice wearing off with respect to his tremor and bradykinesia at 5-6 hours.  He cannot endorse wearing off with his dystonia per se, although it does not appear to be an on phenomenon as well.  He is having problems with acting out his dreams at night.  His wife says it happens more if he forgets to take his night time medication, while he says it is worse if he takes them too late(which makes more sense.)  Medical history, social history, family history, medications and allergies were reviewed and have not changed since the last clinic vist.  ROS:  13 systems were reviewed and are notable for insomnia, frequent early morning awakening.  Chronic double vision being followed by neuro-op at [facility].  All other review of systems are unremarkable.  Exam: . Filed Vitals:   10/28/11 1116  BP: 118/78  Pulse: 88  Weight: 248 lb (112.492 kg)    In general, well appearing older gentleman in NAD.  Mental status:   The patient is oriented to person, place and time. Recent and remote memory are intact. Attention span and concentration are normal. Language including repetition, naming, following commands are intact. Fund of knowledge of current and historical events, as well as vocabulary are normal.  Cranial Nerves: EOMs reveal what appears to be a left hypertropia.  Extraocular movements are intact without nystagmus and downward saccades appear normal.   He has blunting of facial expression and decreased blink rate.  Facial sensation and muscles of mastication are intact. Muscles of facial expression are symmetric. Hearing intact to bilateral finger rub. Tongue protrusion, uvula, palate midline.  Shoulder shrug intact  Motor:  Minor cogwheeling right greater than left.  No significant resting tremor.  Mild bradykinesia bilaterally.  Coordination:  Normal finger to nose  Gait:  Significant truncal dystonia with right lean, turns relatively well, no significant tremor when he walks but reduced arm swing.  Impression:  Parkinson's disease appears adequately treated except for truncal dystonia.  Recommendations:  I would like to start he patient on Artane and increase to 2mg  tid for his dystonia.  If he does not get improvement with this I will consider referring him to [facility] for consideration of DBS placement as this can be effective for the treatment of dystonia in Parkinson's disease.  He is going to follow up with neuro-op for his double vision.  At a later date I may consider a decreased in his agonist to see if this helps his REM behavior disorder.  Also the addition of clonazepam may be useful as well for this.  We will see the patient back inmonths.  [name] Modesto Charon, MD [facility], [place]

## 2011-10-28 NOTE — Patient Instructions (Signed)
trihexphenidyl (artane) 2mg  tablets  take 0.5 tablet in the am. for 5 days, then increase to 0.5 tablets twice(morning, late afternoon) a day for 5 days then take 0.5 tablets three times a day for 5 days then take 1 tab in the a.m., 0.5 tab at midday and 0.5 tab at in late afternoon for 5 days then take 1 tab in the a.m., 0.5 tab at midday and 1 tab in late afternoon for 5 days then take 1 tab three times a day from then on.

## 2011-11-04 ENCOUNTER — Ambulatory Visit: Payer: Medicare Other

## 2011-11-04 DIAGNOSIS — Z7901 Long term (current) use of anticoagulants: Secondary | ICD-10-CM

## 2011-11-04 DIAGNOSIS — I749 Embolism and thrombosis of unspecified artery: Secondary | ICD-10-CM

## 2011-11-04 LAB — POCT INR: INR: 2

## 2011-11-04 NOTE — Patient Instructions (Signed)
°  Latest dosing instructions  ° Total Sun Mon Tue Wed Thu Fri Sat  ° 47.5 7.5 mg 5 mg 7.5 mg 7.5 mg 5 mg 7.5 mg 7.5 mg  °  (5 mg×1.5) (5 mg×1) (5 mg×1.5) (5 mg×1.5) (5 mg×1) (5 mg×1.5) (5 mg×1.5)  °  °  ° ° °

## 2011-11-14 ENCOUNTER — Telehealth: Payer: Self-pay | Admitting: Neurology

## 2011-11-14 NOTE — Telephone Encounter (Signed)
hi lisa - makes sense with these queries now that we have jan to forward them to her first so we can get more details about the dose, etc.  Jan - can you figure out what dose they are on  and whether he had improvement of his dystonia on lower doses without side effects.  if it hasnt made a differnce at all i wld just stop the artane.

## 2011-11-14 NOTE — Telephone Encounter (Signed)
Spoke with the pt's wife. She states that he is currently taking one tab am 1/2 midday and 1/2 late afternoon. She states he falls asleep if he isn't physically doing something; mumbling his words and confused at times. He has had side effects since starting the medication but getting worse as they wean up. She states she has seen no improvement in him since starting the med. I told her that I would let Dr. Modesto Charon know but stopping the med would be recommended. She was asking if they could stop it all at once or do they need to wean down. **Please advise. Thanks.  Jan

## 2011-11-14 NOTE — Telephone Encounter (Signed)
Called and spoke with patient's wife. Informed ok to just stop the Artane--no need to taper. Other options to be discussed at the next f/u appt. No other issues.

## 2011-11-14 NOTE — Telephone Encounter (Signed)
Pt's wife states that Artane is making pt groggy and disoriented. Should they adjust dose or try something else?

## 2011-11-14 NOTE — Telephone Encounter (Signed)
just stop the med.  no need to taper.  i will see them in fu to discuss other options.  matt

## 2011-11-27 ENCOUNTER — Other Ambulatory Visit: Payer: Self-pay | Admitting: Internal Medicine

## 2011-12-02 ENCOUNTER — Ambulatory Visit: Payer: Medicare Other

## 2011-12-02 DIAGNOSIS — I749 Embolism and thrombosis of unspecified artery: Secondary | ICD-10-CM

## 2011-12-02 NOTE — Patient Instructions (Signed)
°  Latest dosing instructions  ° Total Sun Mon Tue Wed Thu Fri Sat  ° 47.5 7.5 mg 5 mg 7.5 mg 7.5 mg 5 mg 7.5 mg 7.5 mg  °  (5 mg×1.5) (5 mg×1) (5 mg×1.5) (5 mg×1.5) (5 mg×1) (5 mg×1.5) (5 mg×1.5)  °  °  ° ° °

## 2011-12-15 ENCOUNTER — Ambulatory Visit (INDEPENDENT_AMBULATORY_CARE_PROVIDER_SITE_OTHER): Payer: Medicare Other | Admitting: Neurology

## 2011-12-15 ENCOUNTER — Encounter: Payer: Self-pay | Admitting: Neurology

## 2011-12-15 ENCOUNTER — Ambulatory Visit: Payer: Medicare Other | Admitting: Neurology

## 2011-12-15 DIAGNOSIS — G20 Parkinson's disease: Secondary | ICD-10-CM | POA: Diagnosis not present

## 2011-12-15 NOTE — Progress Notes (Signed)
Dear Dr. Cato Mulligan,  I saw  Walter Gill back in [facility] for his problem with Parkinson's disease and Pisa syndrome.  As you may recall, he is a 78 y.o. year old male who developed truncal dystonia about 2 years after the beginning of his Parkinson's disease.  He has felt that his Parkinson's disease symptoms, his bradykinesia, and tremor are well treated on his current medication regimen of Sinemet, ropinirole and selegiline.  He does get some wearing off at 5-6 hours.  His truncal dystonia seems to be an off phenomenon, although I am still uncertain about this.  At his last visit I tried Artane, but this resulted in confusion and sedation.    He continues to have problems with REM behavior disorder.  Interestingly, he says it is worse when he misses his night time medication.  His wife describes acting out of dreams as well as somnambulism.   Medical history, social history, and family history were reviewed and have not changed since the last clinic visit.  Current Outpatient Prescriptions on File Prior to Visit  Medication Sig Dispense Refill  . allopurinol (ZYLOPRIM) 100 MG tablet TAKE 1 TABLET EVERY DAY  90 tablet  1  . carbidopa-levodopa (SINEMET CR) 50-200 MG per tablet Take 1 tablet by mouth 4 (four) times daily.       . clotrimazole-betamethasone (LOTRISONE) cream Apply 1 application topically 2 (two) times daily.  30 g  5  . CRESTOR 10 MG tablet Take 10 mg by mouth daily.       Marland Kitchen docusate sodium (COLACE) 100 MG capsule Take 100 mg by mouth 2 (two) times daily.        . furosemide (LASIX) 40 MG tablet TAKE 1 TABLET TWICE DAILY  60 tablet  5  . HYDROcodone-acetaminophen (NORCO) 10-325 MG per tablet Take 1 tablet by mouth 2 (two) times daily. For pain      . nitrofurantoin, macrocrystal-monohydrate, (MACROBID) 100 MG capsule Take 100 mg by mouth at bedtime.       . polyethylene glycol powder (GLYCOLAX/MIRALAX) powder Take 17 g by mouth as needed.       . potassium  chloride SA (K-DUR,KLOR-CON) 20 MEQ tablet Take 1 tablet (20 mEq total) by mouth 2 (two) times daily.  180 tablet  3  . ropinirole (REQUIP) 5 MG tablet Take 5 mg by mouth 4 (four) times daily.       Marland Kitchen SANCTURA XR 60 MG CP24 Take 60 mg by mouth daily.       . selegiline (ELDEPRYL) 5 MG capsule Take 1 capsule (5 mg total) by mouth 2 (two) times daily.  180 capsule  3  . Tamsulosin HCl (FLOMAX) 0.4 MG CAPS Take 1 capsule (0.4 mg total) by mouth daily.  90 capsule  1  . warfarin (COUMADIN) 5 MG tablet Take 5 mg by mouth. 1 tablet Tuesday and Thursday, 1.5 tablets all others         He takes his Sinemet and ropinirole at ~ 600 a.m., 11 a.m., 4 pm and 9 pm.  No Known Allergies  ROS:  13 systems were reviewed and are notable for a lack of light headedness when he stands up.  He does have erectile dysfunction as well as urinary incontinence.   All other review of systems are unremarkable.  Exam: . Filed Vitals:   12/15/11 0900  BP: 144/82  Pulse: 84  Weight: 257 lb (116.574 kg)    In general, well appearing older man.  Mental status:   The patient is oriented to person, place and time. Recent and remote memory are intact. Attention span and concentration are normal. Language including repetition, naming, following commands are intact. Fund of knowledge of current and historical events, as well as vocabulary are normal.  Cranial Nerves: Pupils are equally round and reactive to light. Visual fields full to confrontation. Extraocular movements are intact without nystagmus(although he has diplopia that he see Dr. Daphine Deutscher for at [facility]). Facial sensation and muscles of mastication are intact. Muscles of facial expression are symmetric. Tongue protrusion, uvula, palate midline.  Shoulder shrug intact.  Decreased blink frequency.  No square wave jerks.  Motor:  Mild bradykinesia bilaterally, worse on the right.  Relative absence of tremor today.  Obvious truncal dystonia with flexion to the  right.  Mild cogwheeling, worse on the right.  Gait:  Severe truncal flexion to right.  Decreased arm swing.  Good postural reflexes.  Impression/Recs:  Likely idiopathic Parkinson's disease.  I think MSA is much less likely despite some of his autonomic problems.  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  I am going to refer him to Dr. Rubin Payor at [facility] to get his opinion on other medical treatment vs. Botox vs. DBS.  The patient will return to me after seeing Dr. Rubin Payor.  With respect to his REM behavior disorder I have asked them to try to hold the pm dose of his agonist to see if this helps(despite their contention that missing both the Sinemet and ropinirole make it worse).  If he does not get any effect from this, then we will try clonazepam to suppress his REM sleep.  [name] Modesto Charon, MD [facility], [place]

## 2011-12-15 NOTE — Patient Instructions (Signed)
We will call you with your appointment to [facility].

## 2011-12-16 ENCOUNTER — Ambulatory Visit: Payer: Medicare Other | Admitting: Neurology

## 2011-12-16 DIAGNOSIS — H4910 Fourth [trochlear] nerve palsy, unspecified eye: Secondary | ICD-10-CM | POA: Diagnosis not present

## 2011-12-16 DIAGNOSIS — H538 Other visual disturbances: Secondary | ICD-10-CM | POA: Diagnosis not present

## 2011-12-16 DIAGNOSIS — G20 Parkinson's disease: Secondary | ICD-10-CM | POA: Diagnosis not present

## 2011-12-23 ENCOUNTER — Ambulatory Visit: Payer: Medicare Other | Admitting: Internal Medicine

## 2011-12-23 ENCOUNTER — Ambulatory Visit (INDEPENDENT_AMBULATORY_CARE_PROVIDER_SITE_OTHER): Payer: Medicare Other | Admitting: Internal Medicine

## 2011-12-23 ENCOUNTER — Telehealth: Payer: Self-pay | Admitting: Neurology

## 2011-12-23 DIAGNOSIS — R3915 Urgency of urination: Secondary | ICD-10-CM | POA: Diagnosis not present

## 2011-12-23 DIAGNOSIS — I749 Embolism and thrombosis of unspecified artery: Secondary | ICD-10-CM

## 2011-12-23 DIAGNOSIS — R350 Frequency of micturition: Secondary | ICD-10-CM | POA: Diagnosis not present

## 2011-12-23 NOTE — Patient Instructions (Signed)
°  Latest dosing instructions  ° Total Sun Mon Tue Wed Thu Fri Sat  ° 47.5 7.5 mg 5 mg 7.5 mg 7.5 mg 5 mg 7.5 mg 7.5 mg  °  (5 mg×1.5) (5 mg×1) (5 mg×1.5) (5 mg×1.5) (5 mg×1) (5 mg×1.5) (5 mg×1.5)  °  °  ° ° °

## 2011-12-23 NOTE — Telephone Encounter (Signed)
Pt needs rx for Sinemet (3 months worth) and Requip called into [facility].

## 2011-12-26 MED ORDER — ROPINIROLE HCL 5 MG PO TABS: 5.0000 mg | ORAL_TABLET | Freq: Four times a day (QID) | ORAL | Status: DC

## 2011-12-26 MED ORDER — CARBIDOPA-LEVODOPA ER 50-200 MG PO TBCR: 1.0000 | EXTENDED_RELEASE_TABLET | Freq: Four times a day (QID) | ORAL | Status: DC

## 2011-12-26 NOTE — Telephone Encounter (Signed)
Of course - Tiffany could you take care of.

## 2011-12-27 NOTE — Telephone Encounter (Signed)
rx sent to [facility] for 3 month suppy

## 2012-01-04 DIAGNOSIS — H532 Diplopia: Secondary | ICD-10-CM | POA: Diagnosis not present

## 2012-01-16 DIAGNOSIS — M169 Osteoarthritis of hip, unspecified: Secondary | ICD-10-CM | POA: Diagnosis not present

## 2012-01-20 ENCOUNTER — Ambulatory Visit (INDEPENDENT_AMBULATORY_CARE_PROVIDER_SITE_OTHER): Payer: Medicare Other

## 2012-01-20 DIAGNOSIS — Z7901 Long term (current) use of anticoagulants: Secondary | ICD-10-CM | POA: Diagnosis not present

## 2012-01-20 DIAGNOSIS — I2699 Other pulmonary embolism without acute cor pulmonale: Secondary | ICD-10-CM

## 2012-01-20 DIAGNOSIS — R350 Frequency of micturition: Secondary | ICD-10-CM | POA: Diagnosis not present

## 2012-01-20 DIAGNOSIS — I82419 Acute embolism and thrombosis of unspecified femoral vein: Secondary | ICD-10-CM

## 2012-01-20 DIAGNOSIS — R3915 Urgency of urination: Secondary | ICD-10-CM | POA: Diagnosis not present

## 2012-01-20 NOTE — Patient Instructions (Signed)
°  Latest dosing instructions  ° Total Sun Mon Tue Wed Thu Fri Sat  ° 47.5 7.5 mg 5 mg 7.5 mg 7.5 mg 5 mg 7.5 mg 7.5 mg  °  (5 mg×1.5) (5 mg×1) (5 mg×1.5) (5 mg×1.5) (5 mg×1) (5 mg×1.5) (5 mg×1.5)  °  °  ° ° °

## 2012-02-10 DIAGNOSIS — R3915 Urgency of urination: Secondary | ICD-10-CM | POA: Diagnosis not present

## 2012-02-17 ENCOUNTER — Ambulatory Visit (INDEPENDENT_AMBULATORY_CARE_PROVIDER_SITE_OTHER): Payer: Medicare Other

## 2012-02-17 DIAGNOSIS — I2699 Other pulmonary embolism without acute cor pulmonale: Secondary | ICD-10-CM

## 2012-02-17 DIAGNOSIS — Z7901 Long term (current) use of anticoagulants: Secondary | ICD-10-CM

## 2012-02-17 DIAGNOSIS — I82419 Acute embolism and thrombosis of unspecified femoral vein: Secondary | ICD-10-CM

## 2012-02-17 LAB — POCT INR: INR: 2

## 2012-02-17 NOTE — Patient Instructions (Signed)
°  Latest dosing instructions  ° Total Sun Mon Tue Wed Thu Fri Sat  ° 47.5 7.5 mg 5 mg 7.5 mg 7.5 mg 5 mg 7.5 mg 7.5 mg  °  (5 mg×1.5) (5 mg×1) (5 mg×1.5) (5 mg×1.5) (5 mg×1) (5 mg×1.5) (5 mg×1.5)  °  °  ° ° °

## 2012-03-01 ENCOUNTER — Other Ambulatory Visit: Payer: Self-pay | Admitting: *Deleted

## 2012-03-01 MED ORDER — ROSUVASTATIN CALCIUM 10 MG PO TABS: 10.0000 mg | ORAL_TABLET | Freq: Every day | ORAL | Status: DC

## 2012-03-02 DIAGNOSIS — R3915 Urgency of urination: Secondary | ICD-10-CM | POA: Diagnosis not present

## 2012-03-02 DIAGNOSIS — R350 Frequency of micturition: Secondary | ICD-10-CM | POA: Diagnosis not present

## 2012-03-23 ENCOUNTER — Ambulatory Visit (INDEPENDENT_AMBULATORY_CARE_PROVIDER_SITE_OTHER): Payer: Medicare Other | Admitting: *Deleted

## 2012-03-23 DIAGNOSIS — Z7901 Long term (current) use of anticoagulants: Secondary | ICD-10-CM

## 2012-03-23 DIAGNOSIS — R3915 Urgency of urination: Secondary | ICD-10-CM | POA: Diagnosis not present

## 2012-03-23 DIAGNOSIS — I82419 Acute embolism and thrombosis of unspecified femoral vein: Secondary | ICD-10-CM

## 2012-03-23 DIAGNOSIS — R350 Frequency of micturition: Secondary | ICD-10-CM | POA: Diagnosis not present

## 2012-03-23 DIAGNOSIS — I2699 Other pulmonary embolism without acute cor pulmonale: Secondary | ICD-10-CM

## 2012-03-23 LAB — POCT INR: INR: 2.6

## 2012-03-23 NOTE — Patient Instructions (Signed)
°  Latest dosing instructions  ° Total Sun Mon Tue Wed Thu Fri Sat  ° 47.5 7.5 mg 5 mg 7.5 mg 7.5 mg 5 mg 7.5 mg 7.5 mg  °  (5 mg×1.5) (5 mg×1) (5 mg×1.5) (5 mg×1.5) (5 mg×1) (5 mg×1.5) (5 mg×1.5)  °  °  ° ° °

## 2012-03-26 ENCOUNTER — Other Ambulatory Visit: Payer: Self-pay | Admitting: Internal Medicine

## 2012-04-20 ENCOUNTER — Ambulatory Visit (INDEPENDENT_AMBULATORY_CARE_PROVIDER_SITE_OTHER): Payer: Medicare Other | Admitting: Internal Medicine

## 2012-04-20 ENCOUNTER — Other Ambulatory Visit: Payer: Self-pay | Admitting: Internal Medicine

## 2012-04-20 DIAGNOSIS — I824Y9 Acute embolism and thrombosis of unspecified deep veins of unspecified proximal lower extremity: Secondary | ICD-10-CM

## 2012-04-20 DIAGNOSIS — R350 Frequency of micturition: Secondary | ICD-10-CM | POA: Diagnosis not present

## 2012-04-20 DIAGNOSIS — I82419 Acute embolism and thrombosis of unspecified femoral vein: Secondary | ICD-10-CM

## 2012-04-20 DIAGNOSIS — R3915 Urgency of urination: Secondary | ICD-10-CM | POA: Diagnosis not present

## 2012-04-20 DIAGNOSIS — I2699 Other pulmonary embolism without acute cor pulmonale: Secondary | ICD-10-CM

## 2012-04-20 NOTE — Patient Instructions (Signed)
°  Latest dosing instructions  ° Total Sun Mon Tue Wed Thu Fri Sat  ° 47.5 7.5 mg 5 mg 7.5 mg 7.5 mg 5 mg 7.5 mg 7.5 mg  °  (5 mg×1.5) (5 mg×1) (5 mg×1.5) (5 mg×1.5) (5 mg×1) (5 mg×1.5) (5 mg×1.5)  °  °  ° ° °

## 2012-04-24 ENCOUNTER — Other Ambulatory Visit: Payer: Self-pay | Admitting: *Deleted

## 2012-04-24 MED ORDER — FUROSEMIDE 40 MG PO TABS: 40.0000 mg | ORAL_TABLET | Freq: Two times a day (BID) | ORAL | Status: DC

## 2012-04-25 DIAGNOSIS — F449 Dissociative and conversion disorder, unspecified: Secondary | ICD-10-CM | POA: Diagnosis not present

## 2012-04-25 DIAGNOSIS — G20 Parkinson's disease: Secondary | ICD-10-CM | POA: Diagnosis not present

## 2012-04-25 DIAGNOSIS — M5137 Other intervertebral disc degeneration, lumbosacral region: Secondary | ICD-10-CM | POA: Diagnosis not present

## 2012-04-25 DIAGNOSIS — M47817 Spondylosis without myelopathy or radiculopathy, lumbosacral region: Secondary | ICD-10-CM | POA: Diagnosis not present

## 2012-04-25 DIAGNOSIS — IMO0002 Reserved for concepts with insufficient information to code with codable children: Secondary | ICD-10-CM | POA: Diagnosis not present

## 2012-05-11 DIAGNOSIS — R3915 Urgency of urination: Secondary | ICD-10-CM | POA: Diagnosis not present

## 2012-05-21 ENCOUNTER — Ambulatory Visit (INDEPENDENT_AMBULATORY_CARE_PROVIDER_SITE_OTHER): Payer: Medicare Other | Admitting: Family

## 2012-05-21 DIAGNOSIS — I2699 Other pulmonary embolism without acute cor pulmonale: Secondary | ICD-10-CM | POA: Diagnosis not present

## 2012-05-21 DIAGNOSIS — I824Y9 Acute embolism and thrombosis of unspecified deep veins of unspecified proximal lower extremity: Secondary | ICD-10-CM

## 2012-05-21 DIAGNOSIS — I82419 Acute embolism and thrombosis of unspecified femoral vein: Secondary | ICD-10-CM

## 2012-05-21 LAB — POCT INR: INR: 2.5

## 2012-05-21 NOTE — Patient Instructions (Signed)
Same dose, 5mg . Monday and Thursday all other days 7.5mg . Check in 4 weeks    Latest dosing instructions   Total Sun Mon Tue Wed Thu Fri Sat   47.5 7.5 mg 5 mg 7.5 mg 7.5 mg 5 mg 7.5 mg 7.5 mg    (5 mg1.5) (5 mg1) (5 mg1.5) (5 mg1.5) (5 mg1) (5 mg1.5) (5 mg1.5)

## 2012-05-21 NOTE — Progress Notes (Signed)
Addended byAdline Mango B on: 05/21/2012 02:35 PM   Modules accepted: Orders

## 2012-05-24 DIAGNOSIS — M6281 Muscle weakness (generalized): Secondary | ICD-10-CM | POA: Diagnosis not present

## 2012-05-24 DIAGNOSIS — R269 Unspecified abnormalities of gait and mobility: Secondary | ICD-10-CM | POA: Diagnosis not present

## 2012-06-01 DIAGNOSIS — R3915 Urgency of urination: Secondary | ICD-10-CM | POA: Diagnosis not present

## 2012-06-01 DIAGNOSIS — N3941 Urge incontinence: Secondary | ICD-10-CM | POA: Diagnosis not present

## 2012-06-01 DIAGNOSIS — R350 Frequency of micturition: Secondary | ICD-10-CM | POA: Diagnosis not present

## 2012-06-07 ENCOUNTER — Other Ambulatory Visit: Payer: Self-pay | Admitting: Internal Medicine

## 2012-06-18 ENCOUNTER — Ambulatory Visit (INDEPENDENT_AMBULATORY_CARE_PROVIDER_SITE_OTHER): Payer: Medicare Other | Admitting: Family

## 2012-06-18 DIAGNOSIS — I2699 Other pulmonary embolism without acute cor pulmonale: Secondary | ICD-10-CM

## 2012-06-18 DIAGNOSIS — I824Y9 Acute embolism and thrombosis of unspecified deep veins of unspecified proximal lower extremity: Secondary | ICD-10-CM | POA: Diagnosis not present

## 2012-06-18 LAB — POCT INR: INR: 2.6

## 2012-06-18 NOTE — Patient Instructions (Addendum)
Same dose, 5mg. Monday and Thursday all other days 7.5mg. Check in 6 weeks    Latest dosing instructions   Total Sun Mon Tue Wed Thu Fri Sat   47.5 7.5 mg 5 mg 7.5 mg 7.5 mg 5 mg 7.5 mg 7.5 mg    (5 mg1.5) (5 mg1) (5 mg1.5) (5 mg1.5) (5 mg1) (5 mg1.5) (5 mg1.5)        

## 2012-06-22 DIAGNOSIS — R3915 Urgency of urination: Secondary | ICD-10-CM | POA: Diagnosis not present

## 2012-06-26 DIAGNOSIS — G20 Parkinson's disease: Secondary | ICD-10-CM | POA: Diagnosis not present

## 2012-06-26 DIAGNOSIS — F449 Dissociative and conversion disorder, unspecified: Secondary | ICD-10-CM | POA: Diagnosis not present

## 2012-06-28 DIAGNOSIS — H47219 Primary optic atrophy, unspecified eye: Secondary | ICD-10-CM | POA: Diagnosis not present

## 2012-07-03 ENCOUNTER — Telehealth: Payer: Self-pay | Admitting: Internal Medicine

## 2012-07-03 NOTE — Telephone Encounter (Signed)
Pt is requesting a rx for Hydrocodone / APAP 10  mg/ 325mg  that he takes twice a day. Wife stating that Dr Ethelene Hal with [facility] used to prescribe but pt was told that Dr Cato Mulligan could take over giving the rx.  Pt uses [facility] and can be reached at [phone]

## 2012-07-04 MED ORDER — HYDROCODONE-ACETAMINOPHEN 10-325 MG PO TABS: 1.0000 | ORAL_TABLET | Freq: Two times a day (BID) | ORAL | Status: DC

## 2012-07-04 NOTE — Addendum Note (Signed)
Addended by: Alfred Levins D on: 07/04/2012 10:47 AM   Modules accepted: Orders

## 2012-07-04 NOTE — Telephone Encounter (Signed)
rx called in

## 2012-07-04 NOTE — Telephone Encounter (Signed)
Ok for 6 months 

## 2012-07-13 DIAGNOSIS — R350 Frequency of micturition: Secondary | ICD-10-CM | POA: Diagnosis not present

## 2012-07-13 DIAGNOSIS — R3915 Urgency of urination: Secondary | ICD-10-CM | POA: Diagnosis not present

## 2012-07-23 ENCOUNTER — Ambulatory Visit (INDEPENDENT_AMBULATORY_CARE_PROVIDER_SITE_OTHER): Payer: Medicare Other | Admitting: Family

## 2012-07-23 DIAGNOSIS — I824Y9 Acute embolism and thrombosis of unspecified deep veins of unspecified proximal lower extremity: Secondary | ICD-10-CM

## 2012-07-23 DIAGNOSIS — I2699 Other pulmonary embolism without acute cor pulmonale: Secondary | ICD-10-CM | POA: Diagnosis not present

## 2012-07-23 NOTE — Patient Instructions (Signed)
Same dose, 5mg. Monday and Thursday all other days 7.5mg. Check in 6 weeks    Latest dosing instructions   Total Sun Mon Tue Wed Thu Fri Sat   47.5 7.5 mg 5 mg 7.5 mg 7.5 mg 5 mg 7.5 mg 7.5 mg    (5 mg1.5) (5 mg1) (5 mg1.5) (5 mg1.5) (5 mg1) (5 mg1.5) (5 mg1.5)        

## 2012-07-26 DIAGNOSIS — R32 Unspecified urinary incontinence: Secondary | ICD-10-CM | POA: Diagnosis not present

## 2012-07-26 DIAGNOSIS — M4180 Other forms of scoliosis, site unspecified: Secondary | ICD-10-CM | POA: Diagnosis not present

## 2012-07-26 DIAGNOSIS — F449 Dissociative and conversion disorder, unspecified: Secondary | ICD-10-CM | POA: Diagnosis not present

## 2012-07-26 DIAGNOSIS — IMO0002 Reserved for concepts with insufficient information to code with codable children: Secondary | ICD-10-CM | POA: Diagnosis not present

## 2012-07-26 DIAGNOSIS — Z96649 Presence of unspecified artificial hip joint: Secondary | ICD-10-CM | POA: Diagnosis not present

## 2012-07-26 DIAGNOSIS — M109 Gout, unspecified: Secondary | ICD-10-CM | POA: Diagnosis not present

## 2012-07-26 DIAGNOSIS — Z8679 Personal history of other diseases of the circulatory system: Secondary | ICD-10-CM | POA: Diagnosis not present

## 2012-07-26 DIAGNOSIS — M545 Low back pain, unspecified: Secondary | ICD-10-CM | POA: Diagnosis not present

## 2012-07-26 DIAGNOSIS — G20 Parkinson's disease: Secondary | ICD-10-CM | POA: Diagnosis not present

## 2012-07-26 DIAGNOSIS — Z9849 Cataract extraction status, unspecified eye: Secondary | ICD-10-CM | POA: Diagnosis not present

## 2012-07-26 DIAGNOSIS — E785 Hyperlipidemia, unspecified: Secondary | ICD-10-CM | POA: Diagnosis not present

## 2012-07-26 DIAGNOSIS — Z79899 Other long term (current) drug therapy: Secondary | ICD-10-CM | POA: Diagnosis not present

## 2012-07-26 DIAGNOSIS — M4120 Other idiopathic scoliosis, site unspecified: Secondary | ICD-10-CM | POA: Diagnosis not present

## 2012-07-26 DIAGNOSIS — Z7901 Long term (current) use of anticoagulants: Secondary | ICD-10-CM | POA: Diagnosis not present

## 2012-07-31 ENCOUNTER — Other Ambulatory Visit: Payer: Self-pay | Admitting: Neurology

## 2012-07-31 ENCOUNTER — Telehealth: Payer: Self-pay | Admitting: Neurology

## 2012-07-31 ENCOUNTER — Other Ambulatory Visit: Payer: Self-pay | Admitting: Internal Medicine

## 2012-07-31 NOTE — Telephone Encounter (Signed)
Pt's wife states that Dr at [facility] wants to treat his back with surgery

## 2012-08-02 ENCOUNTER — Ambulatory Visit (INDEPENDENT_AMBULATORY_CARE_PROVIDER_SITE_OTHER): Payer: Medicare Other | Admitting: Neurology

## 2012-08-02 ENCOUNTER — Encounter: Payer: Self-pay | Admitting: Neurology

## 2012-08-02 VITALS — BP 122/72 | HR 84 | Wt 252.0 lb

## 2012-08-02 DIAGNOSIS — G20 Parkinson's disease: Secondary | ICD-10-CM | POA: Diagnosis not present

## 2012-08-02 NOTE — Progress Notes (Signed)
Dear Dr. Cato Mulligan,  I saw  Walter Gill back in [facility] for his problem with Parkinson's disease and truncal dystonia.  As you may recall, he is a 78 y.o. year old male with a history of pulmonary embolism and fourth nerve palsy who developed truncal dystonia about 2 years after the beginning of his Parkinson's disease.  Since I last saw him he has been seen by Dr. Rubin Payor at [facility].  He increased his Sinemet CR 200 q4h, which means he takes typically 5 per day.  They think it may have helped his posture a little.  Dr. Rubin Payor did not feel that his dystonia would respond to DBS.  He did recommend a consultation for spine surgery, and Dr. Lorenso Courier has offered him the equivalent of scoliosis surgery which has been done before on Parkinson's patients with truncal dystonia.  It is a major surgery and they have come to ask my opinion on whether they should have it performed.  It is notable that he had pulmonary embolisms after a hip replacement surgery in the past.   Medical history, social history, and family history were reviewed and have not changed since the last clinic visit.  Current Outpatient Prescriptions on File Prior to Visit  Medication Sig Dispense Refill  . allopurinol (ZYLOPRIM) 100 MG tablet TAKE 1 TABLET EVERY DAY  90 tablet  1  . carbidopa-levodopa (SINEMET CR) 50-200 MG per tablet Take 1 tablet by mouth 4 (four) times daily.  360 tablet  3  . docusate sodium (COLACE) 100 MG capsule Take 100 mg by mouth 2 (two) times daily.        . furosemide (LASIX) 40 MG tablet Take 1 tablet (40 mg total) by mouth 2 (two) times daily.  60 tablet  5  . HYDROcodone-acetaminophen (NORCO) 10-325 MG per tablet Take 1 tablet by mouth 2 (two) times daily. For pain  30 tablet  5  . KLOR-CON M20 20 MEQ tablet TAKE 1 TABLET TWICE A DAY  180 tablet  3  . Mirabegron ER (MYRBETRIQ) 25 MG TB24 Take by mouth.        . nitrofurantoin, macrocrystal-monohydrate, (MACROBID) 100 MG capsule Take 100  mg by mouth at bedtime.       . polyethylene glycol powder (GLYCOLAX/MIRALAX) powder Take 17 g by mouth as needed.       . ropinirole (REQUIP) 5 MG tablet Take 1 tablet (5 mg total) by mouth 4 (four) times daily.  360 tablet  3  . rosuvastatin (CRESTOR) 10 MG tablet Take 1 tablet (10 mg total) by mouth daily.  90 tablet  1  . SANCTURA XR 60 MG CP24 Take 60 mg by mouth daily.       . selegiline (ELDEPRYL) 5 MG capsule TAKE 1 CAPSULE TWICE DAILY  180 capsule  3  . Tamsulosin HCl (FLOMAX) 0.4 MG CAPS TAKE 1 CAPSULE DAILY  90 capsule  1  . warfarin (COUMADIN) 5 MG tablet Take 5 mg by mouth. 1 tablet Tuesday and Thursday, 1.5 tablets all others       . clotrimazole-betamethasone (LOTRISONE) cream Apply 1 application topically 2 (two) times daily.  30 g  5    No Known Allergies  ROS:  13 systems were reviewed and are notable for urinary urgency.  All other review of systems are unremarkable.  Exam: . Filed Vitals:   08/02/12 1402  BP: 122/72  Pulse: 84  Weight: 252 lb (114.306 kg)    In general, well  appearing man.  Mental status:   The patient is oriented to person, place and time. Recent and remote memory are intact. Attention span and concentration are normal. Language including repetition, naming, following commands are intact. Fund of knowledge of current and historical events, as well as vocabulary are normal. Cranial Nerves:  Pupils are equally round and reactive to light. Visual fields full to confrontation. Extraocular movements are intact without nystagmus(although he has diplopia subjectively and I did not carefully analyze the presumed hypertropia ). Facial sensation and muscles of mastication are intact. Muscles of facial expression are symmetric. Tongue protrusion, uvula, palate midline. Shoulder shrug intact. Decreased blink frequency. No square wave jerks.   Motor: Mild bradykinesia bilaterally, worse on the right. Relative absence of tremor today. Obvious truncal dystonia  with flexion to the right. Mild cogwheeling, worse on the right.  Gait: Severe truncal flexion to right. Decreased arm swing. Good postural reflexes.    Impression/Recommendations:  1.  Parkinson's with truncal dystonia - continue Sinemet 200/50 CR q4h.  I do not know the right answer for whether to get back surgery.  I certainly have no experience with outcomes for such surgery in these patients.  However, it does seem like a serious surgery and given his history of having problems with surgeries before he may want to wait given he does have more than minimal risk of complications given his Parkinson's and previous history of pulmonary embolisms after surgery.   [name] Modesto Charon, MD [facility], [place]

## 2012-08-03 DIAGNOSIS — N3941 Urge incontinence: Secondary | ICD-10-CM | POA: Diagnosis not present

## 2012-08-24 DIAGNOSIS — R3915 Urgency of urination: Secondary | ICD-10-CM | POA: Diagnosis not present

## 2012-08-24 DIAGNOSIS — N3941 Urge incontinence: Secondary | ICD-10-CM | POA: Diagnosis not present

## 2012-08-24 DIAGNOSIS — R350 Frequency of micturition: Secondary | ICD-10-CM | POA: Diagnosis not present

## 2012-09-06 ENCOUNTER — Ambulatory Visit (INDEPENDENT_AMBULATORY_CARE_PROVIDER_SITE_OTHER): Payer: Medicare Other | Admitting: Family

## 2012-09-06 DIAGNOSIS — I824Y9 Acute embolism and thrombosis of unspecified deep veins of unspecified proximal lower extremity: Secondary | ICD-10-CM

## 2012-09-06 DIAGNOSIS — I2699 Other pulmonary embolism without acute cor pulmonale: Secondary | ICD-10-CM | POA: Diagnosis not present

## 2012-09-06 LAB — POCT INR: INR: 2.5

## 2012-09-06 NOTE — Patient Instructions (Addendum)
Same dose, 5mg . Monday and Thursday all other days 7.5mg . Check in 6 weeks    Latest dosing instructions   Total Sun Mon Tue Wed Thu Fri Sat   47.5 7.5 mg 5 mg 7.5 mg 7.5 mg 5 mg 7.5 mg 7.5 mg    (5 mg1.5) (5 mg1) (5 mg1.5) (5 mg1.5) (5 mg1) (5 mg1.5) (5 mg1.5)

## 2012-09-12 DIAGNOSIS — Z23 Encounter for immunization: Secondary | ICD-10-CM | POA: Diagnosis not present

## 2012-09-14 DIAGNOSIS — R350 Frequency of micturition: Secondary | ICD-10-CM | POA: Diagnosis not present

## 2012-09-14 DIAGNOSIS — R3915 Urgency of urination: Secondary | ICD-10-CM | POA: Diagnosis not present

## 2012-09-18 ENCOUNTER — Ambulatory Visit: Payer: Medicare Other | Admitting: Internal Medicine

## 2012-09-21 ENCOUNTER — Encounter: Payer: Self-pay | Admitting: Internal Medicine

## 2012-09-21 ENCOUNTER — Ambulatory Visit (INDEPENDENT_AMBULATORY_CARE_PROVIDER_SITE_OTHER): Payer: Medicare Other | Admitting: Internal Medicine

## 2012-09-21 VITALS — BP 142/88 | Temp 98.1°F | Wt 257.0 lb

## 2012-09-21 DIAGNOSIS — I519 Heart disease, unspecified: Secondary | ICD-10-CM | POA: Diagnosis not present

## 2012-09-21 DIAGNOSIS — E785 Hyperlipidemia, unspecified: Secondary | ICD-10-CM

## 2012-09-21 DIAGNOSIS — I2699 Other pulmonary embolism without acute cor pulmonale: Secondary | ICD-10-CM

## 2012-09-21 DIAGNOSIS — I83009 Varicose veins of unspecified lower extremity with ulcer of unspecified site: Secondary | ICD-10-CM | POA: Diagnosis not present

## 2012-09-21 LAB — HEPATIC FUNCTION PANEL
ALT: 9 U/L (ref 0–53)
AST: 27 U/L (ref 0–37)
Albumin: 3.9 g/dL (ref 3.5–5.2)
Alkaline Phosphatase: 73 U/L (ref 39–117)
Bilirubin, Direct: 0.1 mg/dL (ref 0.0–0.3)
Total Protein: 7.5 g/dL (ref 6.0–8.3)

## 2012-09-21 LAB — CBC WITH DIFFERENTIAL/PLATELET
Basophils Absolute: 0 10*3/uL (ref 0.0–0.1)
Eosinophils Absolute: 0.1 10*3/uL (ref 0.0–0.7)
Hemoglobin: 13 g/dL (ref 13.0–17.0)
Lymphocytes Relative: 25.3 % (ref 12.0–46.0)
MCHC: 32.2 g/dL (ref 30.0–36.0)
Neutro Abs: 2.8 10*3/uL (ref 1.4–7.7)
Platelets: 148 10*3/uL — ABNORMAL LOW (ref 150.0–400.0)
RDW: 15.4 % — ABNORMAL HIGH (ref 11.5–14.6)

## 2012-09-21 LAB — BASIC METABOLIC PANEL
CO2: 29 mEq/L (ref 19–32)
Calcium: 9 mg/dL (ref 8.4–10.5)
Chloride: 103 mEq/L (ref 96–112)
Glucose, Bld: 91 mg/dL (ref 70–99)
Potassium: 3.8 mEq/L (ref 3.5–5.1)
Sodium: 141 mEq/L (ref 135–145)

## 2012-09-21 NOTE — Progress Notes (Signed)
Patient ID: Walter Gill, male   DOB: 12-Aug-1934, 78 y.o.   MRN: [id number]  parkinsons- evaluated at [facility]-- reviewed chart Has marked dystonia-- evaluated for surgical intervention to address "curved back" due to dystonia.  Marked LE edema-unchanged. Wearing compression stockings. Has noted a few blisters and ulcers on both lower extremities.  Urinary obstruction-- followed by urology  PE- on warfarin  Past Medical History  Diagnosis Date  . Sleep apnea   . RBBB (right bundle branch block)   . Parkinson disease   . Gout   . Hyperlipidemia   . Hypertension   . OA (osteoarthritis)   . PE (pulmonary embolism) september 2008  . DVT (deep venous thrombosis)   . Left ventricular dysfunction     impaired LV relaxation    History   Social History  . Marital Status: Married    Spouse Name: N/A    Number of Children: N/A  . Years of Education: N/A   Occupational History  . Not on file.   Social History Main Topics  . Smoking status: Never Smoker   . Smokeless tobacco: Never Used  . Alcohol Use: No  . Drug Use: No  . Sexually Active: Not on file   Other Topics Concern  . Not on file   Social History Narrative  . No narrative on file    Past Surgical History  Procedure Date  . Appendectomy   . Cholecystectomy   . Dupruytens contracture   . Total hip arthroplasty 9/8/8  . Pilonidal cyst removal     Family History  Problem Relation Age of Onset  . Heart disease Mother   . Cancer Father     possible gi    No Known Allergies  Current Outpatient Prescriptions on File Prior to Visit  Medication Sig Dispense Refill  . allopurinol (ZYLOPRIM) 100 MG tablet TAKE 1 TABLET EVERY DAY  90 tablet  1  . clotrimazole-betamethasone (LOTRISONE) cream Apply 1 application topically 2 (two) times daily.  30 g  5  . docusate sodium (COLACE) 100 MG capsule Take 100 mg by mouth 2 (two) times daily.        . furosemide (LASIX) 40 MG tablet Take 1 tablet (40 mg total) by mouth 2  (two) times daily.  60 tablet  5  . HYDROcodone-acetaminophen (NORCO) 10-325 MG per tablet Take 1 tablet by mouth 2 (two) times daily. For pain  30 tablet  5  . KLOR-CON M20 20 MEQ tablet TAKE 1 TABLET TWICE A DAY  180 tablet  3  . Mirabegron ER (MYRBETRIQ) 25 MG TB24 Take by mouth.        . nitrofurantoin, macrocrystal-monohydrate, (MACROBID) 100 MG capsule Take 100 mg by mouth at bedtime.       . polyethylene glycol powder (GLYCOLAX/MIRALAX) powder Take 17 g by mouth as needed.       . ropinirole (REQUIP) 5 MG tablet Take 1 tablet (5 mg total) by mouth 4 (four) times daily.  360 tablet  3  . rosuvastatin (CRESTOR) 10 MG tablet Take 1 tablet (10 mg total) by mouth daily.  90 tablet  1  . SANCTURA XR 60 MG CP24 Take 60 mg by mouth daily.       . selegiline (ELDEPRYL) 5 MG capsule TAKE 1 CAPSULE TWICE DAILY  180 capsule  3  . Tamsulosin HCl (FLOMAX) 0.4 MG CAPS TAKE 1 CAPSULE DAILY  90 capsule  1  . warfarin (COUMADIN) 5 MG tablet Take 5 mg  by mouth. 1 tablet Tuesday and Thursday, 1.5 tablets all others       . DISCONTD: carbidopa-levodopa (SINEMET CR) 50-200 MG per tablet Take 1 tablet by mouth 4 (four) times daily.  360 tablet  3     patient denies chest pain, shortness of breath, orthopnea. Denies lower extremity edema, abdominal pain, change in appetite, change in bowel movements. Patient denies rashes, musculoskeletal complaints. No other specific complaints in a complete review of systems.   BP 142/88  Temp 98.1 F (36.7 C) (Oral)  Wt 257 lb (116.574 kg) elderly male in no acute distress. HEENT exam atraumatic, normocephalic, neck supple without jugular venous distention. Chest clear to auscultation cardiac exam S1-S2 are regular. Abdominal exam overweight with bowel sounds, soft and nontender. Extremities 4 +  Edema, extends to posterior thighs. Neurologic exam is alert . He has an antalgic gait- marked leaning to the left. Derm: ulcer on r medial calf-3 cm diameter

## 2012-09-22 DIAGNOSIS — G20 Parkinson's disease: Secondary | ICD-10-CM | POA: Diagnosis not present

## 2012-09-22 DIAGNOSIS — Z86711 Personal history of pulmonary embolism: Secondary | ICD-10-CM | POA: Diagnosis not present

## 2012-09-22 DIAGNOSIS — I519 Heart disease, unspecified: Secondary | ICD-10-CM | POA: Diagnosis not present

## 2012-09-22 DIAGNOSIS — I10 Essential (primary) hypertension: Secondary | ICD-10-CM | POA: Diagnosis not present

## 2012-09-22 DIAGNOSIS — L97209 Non-pressure chronic ulcer of unspecified calf with unspecified severity: Secondary | ICD-10-CM | POA: Diagnosis not present

## 2012-09-22 DIAGNOSIS — I872 Venous insufficiency (chronic) (peripheral): Secondary | ICD-10-CM | POA: Diagnosis not present

## 2012-09-23 NOTE — Assessment & Plan Note (Signed)
He has profound LE edema- wearing compression stockings He has changes of venous insuffieciency  Not hx DVT UNNA boot- change every 3 days by home health

## 2012-09-24 ENCOUNTER — Ambulatory Visit: Payer: Self-pay | Admitting: Family

## 2012-09-24 DIAGNOSIS — I872 Venous insufficiency (chronic) (peripheral): Secondary | ICD-10-CM | POA: Diagnosis not present

## 2012-09-24 DIAGNOSIS — Z86711 Personal history of pulmonary embolism: Secondary | ICD-10-CM | POA: Diagnosis not present

## 2012-09-24 DIAGNOSIS — I10 Essential (primary) hypertension: Secondary | ICD-10-CM | POA: Diagnosis not present

## 2012-09-24 DIAGNOSIS — G20 Parkinson's disease: Secondary | ICD-10-CM | POA: Diagnosis not present

## 2012-09-24 DIAGNOSIS — I519 Heart disease, unspecified: Secondary | ICD-10-CM | POA: Diagnosis not present

## 2012-09-24 DIAGNOSIS — L97209 Non-pressure chronic ulcer of unspecified calf with unspecified severity: Secondary | ICD-10-CM | POA: Diagnosis not present

## 2012-09-24 NOTE — Patient Instructions (Signed)
Patient has been on Coumadin 1.5 years after having a pulmonary embolism after knee replacement surgery. Has been stable since. Dr. Timoteo Gaul and I agree that he is a candidate to be removed from coumadin

## 2012-09-25 DIAGNOSIS — I519 Heart disease, unspecified: Secondary | ICD-10-CM | POA: Diagnosis not present

## 2012-09-25 DIAGNOSIS — L97209 Non-pressure chronic ulcer of unspecified calf with unspecified severity: Secondary | ICD-10-CM | POA: Diagnosis not present

## 2012-09-25 DIAGNOSIS — G20 Parkinson's disease: Secondary | ICD-10-CM | POA: Diagnosis not present

## 2012-09-25 DIAGNOSIS — I10 Essential (primary) hypertension: Secondary | ICD-10-CM | POA: Diagnosis not present

## 2012-09-25 DIAGNOSIS — I872 Venous insufficiency (chronic) (peripheral): Secondary | ICD-10-CM | POA: Diagnosis not present

## 2012-09-25 DIAGNOSIS — Z86711 Personal history of pulmonary embolism: Secondary | ICD-10-CM | POA: Diagnosis not present

## 2012-09-28 DIAGNOSIS — G20 Parkinson's disease: Secondary | ICD-10-CM | POA: Diagnosis not present

## 2012-09-28 DIAGNOSIS — Z86711 Personal history of pulmonary embolism: Secondary | ICD-10-CM | POA: Diagnosis not present

## 2012-09-28 DIAGNOSIS — L97209 Non-pressure chronic ulcer of unspecified calf with unspecified severity: Secondary | ICD-10-CM | POA: Diagnosis not present

## 2012-09-28 DIAGNOSIS — I872 Venous insufficiency (chronic) (peripheral): Secondary | ICD-10-CM | POA: Diagnosis not present

## 2012-09-28 DIAGNOSIS — I519 Heart disease, unspecified: Secondary | ICD-10-CM | POA: Diagnosis not present

## 2012-09-28 DIAGNOSIS — I10 Essential (primary) hypertension: Secondary | ICD-10-CM | POA: Diagnosis not present

## 2012-10-02 ENCOUNTER — Telehealth: Payer: Self-pay | Admitting: Internal Medicine

## 2012-10-02 DIAGNOSIS — I10 Essential (primary) hypertension: Secondary | ICD-10-CM | POA: Diagnosis not present

## 2012-10-02 DIAGNOSIS — I872 Venous insufficiency (chronic) (peripheral): Secondary | ICD-10-CM | POA: Diagnosis not present

## 2012-10-02 DIAGNOSIS — Z86711 Personal history of pulmonary embolism: Secondary | ICD-10-CM | POA: Diagnosis not present

## 2012-10-02 DIAGNOSIS — G20 Parkinson's disease: Secondary | ICD-10-CM | POA: Diagnosis not present

## 2012-10-02 DIAGNOSIS — I519 Heart disease, unspecified: Secondary | ICD-10-CM | POA: Diagnosis not present

## 2012-10-02 DIAGNOSIS — L97209 Non-pressure chronic ulcer of unspecified calf with unspecified severity: Secondary | ICD-10-CM | POA: Diagnosis not present

## 2012-10-02 NOTE — Telephone Encounter (Signed)
Requesting rx for rolling walker with seat.  Please faxt to [phone], attn customer support

## 2012-10-03 NOTE — Telephone Encounter (Signed)
Ok per Dr Swords, rx faxed 

## 2012-10-05 DIAGNOSIS — R350 Frequency of micturition: Secondary | ICD-10-CM | POA: Diagnosis not present

## 2012-10-05 DIAGNOSIS — I519 Heart disease, unspecified: Secondary | ICD-10-CM | POA: Diagnosis not present

## 2012-10-05 DIAGNOSIS — R3915 Urgency of urination: Secondary | ICD-10-CM | POA: Diagnosis not present

## 2012-10-05 DIAGNOSIS — I872 Venous insufficiency (chronic) (peripheral): Secondary | ICD-10-CM | POA: Diagnosis not present

## 2012-10-05 DIAGNOSIS — G20 Parkinson's disease: Secondary | ICD-10-CM | POA: Diagnosis not present

## 2012-10-05 DIAGNOSIS — L97209 Non-pressure chronic ulcer of unspecified calf with unspecified severity: Secondary | ICD-10-CM | POA: Diagnosis not present

## 2012-10-05 DIAGNOSIS — I10 Essential (primary) hypertension: Secondary | ICD-10-CM | POA: Diagnosis not present

## 2012-10-05 DIAGNOSIS — Z86711 Personal history of pulmonary embolism: Secondary | ICD-10-CM | POA: Diagnosis not present

## 2012-10-10 DIAGNOSIS — G20 Parkinson's disease: Secondary | ICD-10-CM | POA: Diagnosis not present

## 2012-10-10 DIAGNOSIS — L97209 Non-pressure chronic ulcer of unspecified calf with unspecified severity: Secondary | ICD-10-CM | POA: Diagnosis not present

## 2012-10-10 DIAGNOSIS — I519 Heart disease, unspecified: Secondary | ICD-10-CM | POA: Diagnosis not present

## 2012-10-10 DIAGNOSIS — Z86711 Personal history of pulmonary embolism: Secondary | ICD-10-CM | POA: Diagnosis not present

## 2012-10-10 DIAGNOSIS — I872 Venous insufficiency (chronic) (peripheral): Secondary | ICD-10-CM | POA: Diagnosis not present

## 2012-10-10 DIAGNOSIS — I10 Essential (primary) hypertension: Secondary | ICD-10-CM | POA: Diagnosis not present

## 2012-10-11 ENCOUNTER — Telehealth: Payer: Self-pay | Admitting: Internal Medicine

## 2012-10-11 NOTE — Telephone Encounter (Signed)
[facility] called req [employer] No further skin break down noted. There is some redness still. Should pt continue wearing [employer]? [facility] has 5 more visits with pt til Dec 4. Visists are once a wk. Should [facility] continue the visits or should pt be discharged? Pls call for verbal order.

## 2012-10-15 NOTE — Telephone Encounter (Signed)
Left message for pt to call back  °

## 2012-10-15 NOTE — Telephone Encounter (Signed)
Pts spouse called and wanted to know the status of Unna Boot and pts legs. [facility] was wanting to know what the pt needs to do. Suppose to going out to pts home.

## 2012-10-15 NOTE — Telephone Encounter (Signed)
No further unna boot Change to daily compression stockings-- can [facility] help with tools to put on compression stockings

## 2012-10-16 NOTE — Telephone Encounter (Signed)
Dot Lanes, RN at [facility] aware and will discuss with her Nurse Manager to see if there is something they can do

## 2012-10-17 DIAGNOSIS — G20 Parkinson's disease: Secondary | ICD-10-CM | POA: Diagnosis not present

## 2012-10-17 DIAGNOSIS — Z86711 Personal history of pulmonary embolism: Secondary | ICD-10-CM | POA: Diagnosis not present

## 2012-10-17 DIAGNOSIS — L97209 Non-pressure chronic ulcer of unspecified calf with unspecified severity: Secondary | ICD-10-CM | POA: Diagnosis not present

## 2012-10-17 DIAGNOSIS — I872 Venous insufficiency (chronic) (peripheral): Secondary | ICD-10-CM | POA: Diagnosis not present

## 2012-10-17 DIAGNOSIS — I519 Heart disease, unspecified: Secondary | ICD-10-CM | POA: Diagnosis not present

## 2012-10-17 DIAGNOSIS — I10 Essential (primary) hypertension: Secondary | ICD-10-CM | POA: Diagnosis not present

## 2012-10-18 ENCOUNTER — Other Ambulatory Visit: Payer: Self-pay | Admitting: *Deleted

## 2012-10-18 MED ORDER — ROSUVASTATIN CALCIUM 10 MG PO TABS: 10.0000 mg | ORAL_TABLET | Freq: Every day | ORAL | Status: DC

## 2012-10-21 DIAGNOSIS — L97209 Non-pressure chronic ulcer of unspecified calf with unspecified severity: Secondary | ICD-10-CM | POA: Diagnosis not present

## 2012-10-21 DIAGNOSIS — I872 Venous insufficiency (chronic) (peripheral): Secondary | ICD-10-CM | POA: Diagnosis not present

## 2012-10-21 DIAGNOSIS — G20 Parkinson's disease: Secondary | ICD-10-CM

## 2012-10-21 DIAGNOSIS — I519 Heart disease, unspecified: Secondary | ICD-10-CM

## 2012-10-23 ENCOUNTER — Other Ambulatory Visit: Payer: Self-pay | Admitting: Internal Medicine

## 2012-10-24 DIAGNOSIS — I872 Venous insufficiency (chronic) (peripheral): Secondary | ICD-10-CM | POA: Diagnosis not present

## 2012-10-24 DIAGNOSIS — I10 Essential (primary) hypertension: Secondary | ICD-10-CM | POA: Diagnosis not present

## 2012-10-24 DIAGNOSIS — Z86711 Personal history of pulmonary embolism: Secondary | ICD-10-CM | POA: Diagnosis not present

## 2012-10-24 DIAGNOSIS — G20 Parkinson's disease: Secondary | ICD-10-CM | POA: Diagnosis not present

## 2012-10-24 DIAGNOSIS — I519 Heart disease, unspecified: Secondary | ICD-10-CM | POA: Diagnosis not present

## 2012-10-24 DIAGNOSIS — L97209 Non-pressure chronic ulcer of unspecified calf with unspecified severity: Secondary | ICD-10-CM | POA: Diagnosis not present

## 2012-10-25 ENCOUNTER — Encounter: Payer: Self-pay | Admitting: Neurology

## 2012-10-25 ENCOUNTER — Encounter: Payer: Medicare Other | Admitting: Family

## 2012-10-25 ENCOUNTER — Ambulatory Visit (INDEPENDENT_AMBULATORY_CARE_PROVIDER_SITE_OTHER): Payer: Medicare Other | Admitting: Neurology

## 2012-10-25 VITALS — BP 120/74 | HR 72 | Temp 97.7°F | Resp 18 | Wt 254.0 lb

## 2012-10-25 DIAGNOSIS — G20 Parkinson's disease: Secondary | ICD-10-CM

## 2012-10-25 DIAGNOSIS — G4752 REM sleep behavior disorder: Secondary | ICD-10-CM | POA: Diagnosis not present

## 2012-10-25 DIAGNOSIS — G20A1 Parkinson's disease without dyskinesia, without mention of fluctuations: Secondary | ICD-10-CM

## 2012-10-25 MED ORDER — ROPINIROLE HCL 5 MG PO TABS: 5.0000 mg | ORAL_TABLET | Freq: Three times a day (TID) | ORAL | Status: DC

## 2012-10-25 MED ORDER — CLONAZEPAM 0.5 MG PO TABS: ORAL_TABLET | ORAL | Status: DC

## 2012-10-25 MED ORDER — CARBIDOPA-LEVODOPA ER 50-200 MG PO TBCR: EXTENDED_RELEASE_TABLET | ORAL | Status: DC

## 2012-10-25 NOTE — Patient Instructions (Addendum)
1.  Decrease requip to one tablet three times per day 2.  Start klonopin (clonzaepam) - 1/2 tablet at night for a week and then may increase to one tablet nightly 3.  Call me with questions/concerns
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
## 2012-10-26 DIAGNOSIS — R3915 Urgency of urination: Secondary | ICD-10-CM | POA: Diagnosis not present

## 2012-10-31 DIAGNOSIS — N401 Enlarged prostate with lower urinary tract symptoms: Secondary | ICD-10-CM | POA: Diagnosis not present

## 2012-10-31 DIAGNOSIS — N318 Other neuromuscular dysfunction of bladder: Secondary | ICD-10-CM | POA: Diagnosis not present

## 2012-10-31 DIAGNOSIS — R350 Frequency of micturition: Secondary | ICD-10-CM | POA: Diagnosis not present

## 2012-11-16 DIAGNOSIS — R3915 Urgency of urination: Secondary | ICD-10-CM | POA: Diagnosis not present

## 2012-11-16 DIAGNOSIS — R350 Frequency of micturition: Secondary | ICD-10-CM | POA: Diagnosis not present

## 2012-11-21 ENCOUNTER — Other Ambulatory Visit: Payer: Self-pay | Admitting: *Deleted

## 2012-12-14 ENCOUNTER — Encounter: Payer: Self-pay | Admitting: Internal Medicine

## 2012-12-14 DIAGNOSIS — R350 Frequency of micturition: Secondary | ICD-10-CM | POA: Diagnosis not present

## 2012-12-14 DIAGNOSIS — N3941 Urge incontinence: Secondary | ICD-10-CM | POA: Diagnosis not present

## 2012-12-14 DIAGNOSIS — R3915 Urgency of urination: Secondary | ICD-10-CM | POA: Diagnosis not present

## 2012-12-18 ENCOUNTER — Other Ambulatory Visit: Payer: Self-pay | Admitting: Internal Medicine

## 2012-12-19 ENCOUNTER — Other Ambulatory Visit: Payer: Self-pay | Admitting: Internal Medicine

## 2013-01-11 DIAGNOSIS — R350 Frequency of micturition: Secondary | ICD-10-CM | POA: Diagnosis not present

## 2013-01-11 DIAGNOSIS — R3915 Urgency of urination: Secondary | ICD-10-CM | POA: Diagnosis not present

## 2013-01-16 ENCOUNTER — Ambulatory Visit: Payer: Medicare Other | Admitting: Neurology

## 2013-01-18 ENCOUNTER — Encounter: Payer: Self-pay | Admitting: Neurology

## 2013-01-18 ENCOUNTER — Ambulatory Visit (INDEPENDENT_AMBULATORY_CARE_PROVIDER_SITE_OTHER): Payer: Medicare Other | Admitting: Neurology

## 2013-01-18 VITALS — BP 138/80 | HR 72 | Temp 98.0°F | Resp 18 | Wt 251.0 lb

## 2013-01-18 DIAGNOSIS — G4752 REM sleep behavior disorder: Secondary | ICD-10-CM | POA: Diagnosis not present

## 2013-01-18 DIAGNOSIS — R209 Unspecified disturbances of skin sensation: Secondary | ICD-10-CM

## 2013-01-18 DIAGNOSIS — G4710 Hypersomnia, unspecified: Secondary | ICD-10-CM

## 2013-01-18 DIAGNOSIS — R5381 Other malaise: Secondary | ICD-10-CM | POA: Diagnosis not present

## 2013-01-18 DIAGNOSIS — R5383 Other fatigue: Secondary | ICD-10-CM

## 2013-01-18 DIAGNOSIS — G20 Parkinson's disease: Secondary | ICD-10-CM

## 2013-01-18 DIAGNOSIS — R202 Paresthesia of skin: Secondary | ICD-10-CM

## 2013-01-18 DIAGNOSIS — G4719 Other hypersomnia: Secondary | ICD-10-CM

## 2013-01-18 LAB — VITAMIN B12: Vitamin B-12: 231 pg/mL (ref 211–911)
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
## 2013-02-01 DIAGNOSIS — R3915 Urgency of urination: Secondary | ICD-10-CM | POA: Diagnosis not present

## 2013-02-01 DIAGNOSIS — R350 Frequency of micturition: Secondary | ICD-10-CM | POA: Diagnosis not present

## 2013-02-06 DIAGNOSIS — Z961 Presence of intraocular lens: Secondary | ICD-10-CM | POA: Diagnosis not present

## 2013-02-06 DIAGNOSIS — H5200 Hypermetropia, unspecified eye: Secondary | ICD-10-CM | POA: Diagnosis not present

## 2013-02-06 DIAGNOSIS — H35039 Hypertensive retinopathy, unspecified eye: Secondary | ICD-10-CM | POA: Diagnosis not present

## 2013-02-22 DIAGNOSIS — N3941 Urge incontinence: Secondary | ICD-10-CM | POA: Diagnosis not present

## 2013-02-22 DIAGNOSIS — R350 Frequency of micturition: Secondary | ICD-10-CM | POA: Diagnosis not present

## 2013-02-22 DIAGNOSIS — R3915 Urgency of urination: Secondary | ICD-10-CM | POA: Diagnosis not present

## 2013-02-26 ENCOUNTER — Telehealth: Payer: Self-pay | Admitting: Internal Medicine

## 2013-02-26 NOTE — Telephone Encounter (Signed)
Patient wife called and stated that the pt's sleep machine needs to be replaced. In order for [facility] to replace it, they will need documentation from [place] stating that it is necessary that he has it, and that he does utilize it. Pt wife stated that Dr. Cato Mulligan was the one who originally suggested that he use this machine. Please assist.

## 2013-03-04 ENCOUNTER — Encounter: Payer: Self-pay | Admitting: *Deleted

## 2013-03-04 NOTE — Telephone Encounter (Signed)
Letter printed and mailed to pt.

## 2013-03-15 DIAGNOSIS — N3941 Urge incontinence: Secondary | ICD-10-CM | POA: Diagnosis not present

## 2013-03-15 DIAGNOSIS — R350 Frequency of micturition: Secondary | ICD-10-CM | POA: Diagnosis not present

## 2013-03-15 DIAGNOSIS — R3915 Urgency of urination: Secondary | ICD-10-CM | POA: Diagnosis not present

## 2013-03-20 ENCOUNTER — Other Ambulatory Visit: Payer: Self-pay | Admitting: Internal Medicine

## 2013-03-23 ENCOUNTER — Other Ambulatory Visit: Payer: Self-pay | Admitting: Internal Medicine

## 2013-04-01 ENCOUNTER — Encounter: Payer: Self-pay | Admitting: *Deleted

## 2013-04-01 ENCOUNTER — Other Ambulatory Visit: Payer: Self-pay | Admitting: *Deleted

## 2013-04-01 MED ORDER — ROSUVASTATIN CALCIUM 10 MG PO TABS: 10.0000 mg | ORAL_TABLET | Freq: Every day | ORAL | Status: DC

## 2013-04-08 ENCOUNTER — Encounter: Payer: Self-pay | Admitting: Internal Medicine

## 2013-04-12 DIAGNOSIS — R3915 Urgency of urination: Secondary | ICD-10-CM | POA: Diagnosis not present

## 2013-04-12 DIAGNOSIS — R350 Frequency of micturition: Secondary | ICD-10-CM | POA: Diagnosis not present

## 2013-04-12 DIAGNOSIS — N3941 Urge incontinence: Secondary | ICD-10-CM | POA: Diagnosis not present

## 2013-04-17 ENCOUNTER — Encounter: Payer: Self-pay | Admitting: Internal Medicine

## 2013-04-17 ENCOUNTER — Ambulatory Visit (INDEPENDENT_AMBULATORY_CARE_PROVIDER_SITE_OTHER): Payer: Medicare Other | Admitting: Internal Medicine

## 2013-04-17 ENCOUNTER — Other Ambulatory Visit: Payer: Self-pay | Admitting: Internal Medicine

## 2013-04-17 VITALS — BP 136/72 | HR 76 | Temp 97.5°F | Wt 248.0 lb

## 2013-04-17 DIAGNOSIS — Z9989 Dependence on other enabling machines and devices: Secondary | ICD-10-CM | POA: Insufficient documentation

## 2013-04-17 DIAGNOSIS — I2699 Other pulmonary embolism without acute cor pulmonale: Secondary | ICD-10-CM | POA: Diagnosis not present

## 2013-04-17 DIAGNOSIS — G4733 Obstructive sleep apnea (adult) (pediatric): Secondary | ICD-10-CM | POA: Insufficient documentation

## 2013-04-17 DIAGNOSIS — I10 Essential (primary) hypertension: Secondary | ICD-10-CM | POA: Diagnosis not present

## 2013-04-17 NOTE — Progress Notes (Signed)
Patient ID: Walter Gill, male   DOB: 11/10/1934, 79 y.o.   MRN: [id number] parkinsons--- he thinks he is doing better. Has regular f/u with neurology  Sleep Apnea- using CPAP. They would like to consider a new machine (too loud). He has been under my care for OSA-

## 2013-04-17 NOTE — Assessment & Plan Note (Signed)
i have been taking care of him and addressing his OSA for > 10 years. He is compliant with CPAP and uses every night. CPAP is absolutely necessary for him and his long term health

## 2013-04-17 NOTE — Assessment & Plan Note (Signed)
No recurrence and off warfarin

## 2013-04-17 NOTE — Assessment & Plan Note (Signed)
Well controlled Continue meds 

## 2013-04-26 ENCOUNTER — Other Ambulatory Visit: Payer: Self-pay | Admitting: Internal Medicine

## 2013-04-26 ENCOUNTER — Telehealth: Payer: Self-pay

## 2013-04-26 MED ORDER — CARBIDOPA-LEVODOPA ER 50-200 MG PO TBCR: EXTENDED_RELEASE_TABLET | ORAL | Status: DC

## 2013-04-26 MED ORDER — ROPINIROLE HCL 5 MG PO TABS: 5.0000 mg | ORAL_TABLET | Freq: Three times a day (TID) | ORAL | Status: DC

## 2013-04-26 NOTE — Telephone Encounter (Signed)
I believe that he should be on 5mg  tid so 270 would be fine with 3 refills.  Okay for the CR as well, 5 tablets per day, #450, rf 3

## 2013-04-26 NOTE — Telephone Encounter (Signed)
And Sinemet CR 50/200 #450

## 2013-04-26 NOTE — Telephone Encounter (Signed)
Refill sent to pt's pharmacy. 

## 2013-04-26 NOTE — Telephone Encounter (Signed)
Fax refill request for Requip 5mg  #270

## 2013-05-10 DIAGNOSIS — R3915 Urgency of urination: Secondary | ICD-10-CM | POA: Diagnosis not present

## 2013-05-10 DIAGNOSIS — R350 Frequency of micturition: Secondary | ICD-10-CM | POA: Diagnosis not present

## 2013-05-13 ENCOUNTER — Encounter (HOSPITAL_COMMUNITY): Payer: Self-pay | Admitting: Emergency Medicine

## 2013-05-13 ENCOUNTER — Emergency Department (HOSPITAL_COMMUNITY): Payer: Medicare Other

## 2013-05-13 ENCOUNTER — Emergency Department (HOSPITAL_COMMUNITY)
Admission: EM | Admit: 2013-05-13 | Discharge: 2013-05-13 | Disposition: A | Discharge status: Home / Self Care | Payer: Medicare Other | Attending: Emergency Medicine | Admitting: Emergency Medicine

## 2013-05-13 DIAGNOSIS — Z7982 Long term (current) use of aspirin: Secondary | ICD-10-CM | POA: Insufficient documentation

## 2013-05-13 DIAGNOSIS — E785 Hyperlipidemia, unspecified: Secondary | ICD-10-CM | POA: Diagnosis not present

## 2013-05-13 DIAGNOSIS — Z79899 Other long term (current) drug therapy: Secondary | ICD-10-CM | POA: Diagnosis not present

## 2013-05-13 DIAGNOSIS — Z8739 Personal history of other diseases of the musculoskeletal system and connective tissue: Secondary | ICD-10-CM | POA: Insufficient documentation

## 2013-05-13 DIAGNOSIS — Z8679 Personal history of other diseases of the circulatory system: Secondary | ICD-10-CM | POA: Insufficient documentation

## 2013-05-13 DIAGNOSIS — G20A1 Parkinson's disease without dyskinesia, without mention of fluctuations: Secondary | ICD-10-CM | POA: Insufficient documentation

## 2013-05-13 DIAGNOSIS — G4730 Sleep apnea, unspecified: Secondary | ICD-10-CM | POA: Insufficient documentation

## 2013-05-13 DIAGNOSIS — Z862 Personal history of diseases of the blood and blood-forming organs and certain disorders involving the immune mechanism: Secondary | ICD-10-CM | POA: Insufficient documentation

## 2013-05-13 DIAGNOSIS — R109 Unspecified abdominal pain: Secondary | ICD-10-CM | POA: Insufficient documentation

## 2013-05-13 DIAGNOSIS — Z86711 Personal history of pulmonary embolism: Secondary | ICD-10-CM | POA: Insufficient documentation

## 2013-05-13 DIAGNOSIS — G20 Parkinson's disease: Secondary | ICD-10-CM | POA: Diagnosis not present

## 2013-05-13 DIAGNOSIS — Z8639 Personal history of other endocrine, nutritional and metabolic disease: Secondary | ICD-10-CM | POA: Insufficient documentation

## 2013-05-13 DIAGNOSIS — K5900 Constipation, unspecified: Secondary | ICD-10-CM | POA: Insufficient documentation

## 2013-05-13 DIAGNOSIS — I10 Essential (primary) hypertension: Secondary | ICD-10-CM | POA: Insufficient documentation

## 2013-05-13 DIAGNOSIS — R197 Diarrhea, unspecified: Secondary | ICD-10-CM | POA: Diagnosis not present

## 2013-05-13 LAB — CBC WITH DIFFERENTIAL/PLATELET
Basophils Relative: 0 % (ref 0–1)
Eosinophils Absolute: 0 10*3/uL (ref 0.0–0.7)
Eosinophils Relative: 0 % (ref 0–5)
MCH: 26.8 pg (ref 26.0–34.0)
MCHC: 32.3 g/dL (ref 30.0–36.0)
Neutrophils Relative %: 84 % — ABNORMAL HIGH (ref 43–77)
Platelets: 138 10*3/uL — ABNORMAL LOW (ref 150–400)
RDW: 15.2 % (ref 11.5–15.5)

## 2013-05-13 LAB — COMPREHENSIVE METABOLIC PANEL
AST: 31 U/L (ref 0–37)
Creatinine, Ser: 0.73 mg/dL (ref 0.50–1.35)
GFR calc Af Amer: >90 mL/min (ref >90)
GFR calc non Af Amer: 87 mL/min — ABNORMAL LOW (ref >90)
Potassium: 4.1 mEq/L (ref 3.5–5.1)
Sodium: 138 mEq/L (ref 135–145)
Total Bilirubin: 1 mg/dL (ref 0.3–1.2)

## 2013-05-13 LAB — URINALYSIS, ROUTINE W REFLEX MICROSCOPIC
Bilirubin Urine: NEGATIVE
Hgb urine dipstick: NEGATIVE
Protein, ur: NEGATIVE mg/dL
Urobilinogen, UA: 0.2 mg/dL (ref 0.0–1.0)

## 2013-05-13 MED ORDER — BISACODYL 10 MG RE SUPP
10.0000 mg | Freq: Once | RECTAL | Status: AC
  Administered 2013-05-13: 10 mg via RECTAL
  Filled 2013-05-13: qty 1

## 2013-05-13 NOTE — ED Provider Notes (Signed)
History     CSN: [id number]  Arrival date & time 05/13/13  1539   First MD Initiated Contact with Patient 05/13/13 1808      Chief Complaint  Patient presents with  . Abdominal Pain    (Consider location/radiation/quality/duration/timing/severity/associated sxs/prior treatment) HPI  Patient reports his last normal bowel movement was a week ago. He states she's had constipation once before. He states he started having decreased appetite yesterday because his abdomen feels distended. He denies nausea or vomiting or fever. He relates yesterday at church he felt the urge to go but he could not have a bowel movement. He denies any change in his medications including no new pain medications.  PCP Dr Cato Mulligan  Past Medical History  Diagnosis Date  . Sleep apnea   . RBBB (right bundle branch block)   . Parkinson disease   . Gout   . Hyperlipidemia   . Hypertension   . OA (osteoarthritis)   . PE (pulmonary embolism) september 2008  . DVT (deep venous thrombosis)   . Left ventricular dysfunction     impaired LV relaxation    Past Surgical History  Procedure Laterality Date  . Appendectomy    . Cholecystectomy    . Dupruytens contracture    . Total hip arthroplasty  9/8/8    right  . Pilonidal cyst removal    . Eye surgery      ? for lid lag vs apraxia of eye opening  . Cataract extraction      Family History  Problem Relation Age of Onset  . Heart disease Mother   . Cancer Father     possible gi    History  Substance Use Topics  . Smoking status: Never Smoker   . Smokeless tobacco: Never Used  . Alcohol Use: No   Lives at home Lives with spouse   Review of Systems  All other systems reviewed and are negative.    Allergies  Review of patient's allergies indicates no known allergies.  Home Medications   Current Outpatient Rx  Name  Route  Sig  Dispense  Refill  . allopurinol (ZYLOPRIM) 100 MG tablet   Oral   Take 100 mg by mouth daily.         Marland Kitchen  aspirin 81 MG chewable tablet   Oral   Chew 81 mg by mouth at bedtime.          . carbidopa-levodopa (SINEMET CR) 50-200 MG per tablet   Oral   Take 1 tablet by mouth 5 (five) times daily.         . clotrimazole-betamethasone (LOTRISONE) cream   Topical   Apply 1 application topically 2 (two) times daily.   30 g   5   . docusate sodium (COLACE) 100 MG capsule   Oral   Take 100 mg by mouth 2 (two) times daily.           . furosemide (LASIX) 40 MG tablet   Oral   Take 40 mg by mouth 2 (two) times daily.         Marland Kitchen HYDROcodone-acetaminophen (NORCO) 10-325 MG per tablet   Oral   Take 1 tablet by mouth 2 (two) times daily as needed for pain.         . Mirabegron ER (MYRBETRIQ) 25 MG TB24   Oral   Take 25 mg by mouth at bedtime.          . nitrofurantoin, macrocrystal-monohydrate, (MACROBID) 100 MG capsule  Oral   Take 100 mg by mouth at bedtime.          . polyethylene glycol powder (GLYCOLAX/MIRALAX) powder   Oral   Take 17 g by mouth daily as needed (constipation).          . potassium chloride (KLOR-CON) 20 MEQ packet   Oral   Take 20 mEq by mouth 2 (two) times daily.         . ropinirole (REQUIP) 5 MG tablet   Oral   Take 1 tablet (5 mg total) by mouth 3 (three) times daily.   270 tablet   3   . rosuvastatin (CRESTOR) 10 MG tablet   Oral   Take 10 mg by mouth at bedtime.         Marland Kitchen SANCTURA XR 60 MG CP24   Oral   Take 60 mg by mouth every morning.          . selegiline (ELDEPRYL) 5 MG capsule   Oral   Take 5 mg by mouth 2 (two) times daily before a meal.         . tamsulosin (FLOMAX) 0.4 MG CAPS   Oral   Take 0.4 mg by mouth at bedtime.           BP 161/79  Pulse 88  Temp(Src) 98.2 F (36.8 C) (Oral)  Resp 14  SpO2 99%  Vital signs normal    Physical Exam  Nursing note and vitals reviewed. Constitutional: He is oriented to person, place, and time. He appears well-developed and well-nourished.  Non-toxic appearance.  He does not appear ill. No distress.  HENT:  Head: Normocephalic and atraumatic.  Right Ear: External ear normal.  Left Ear: External ear normal.  Nose: Nose normal. No mucosal edema or rhinorrhea.  Mouth/Throat: Oropharynx is clear and moist and mucous membranes are normal. No dental abscesses or edematous.  Eyes: Conjunctivae and EOM are normal. Pupils are equal, round, and reactive to light.  Neck: Normal range of motion and full passive range of motion without pain. Neck supple.  Cardiovascular: Normal rate, regular rhythm and normal heart sounds.  Exam reveals no gallop and no friction rub.   No murmur heard. Pulmonary/Chest: Effort normal and breath sounds normal. No respiratory distress. He has no wheezes. He has no rhonchi. He has no rales. He exhibits no tenderness and no crepitus.  Abdominal: Soft. Normal appearance and bowel sounds are normal. He exhibits no distension. There is tenderness. There is no rebound and no guarding.  Musculoskeletal: Normal range of motion. He exhibits no edema and no tenderness.  Moves all extremities well.   Neurological: He is alert and oriented to person, place, and time. He has normal strength. No cranial nerve deficit.  Skin: Skin is warm, dry and intact. No rash noted. No erythema. No pallor.  Psychiatric: He has a normal mood and affect. His speech is normal and behavior is normal. His mood appears not anxious.    ED Course  Procedures (including critical care time)  Medications  bisacodyl (DULCOLAX) suppository 10 mg (10 mg Rectal Given 05/13/13 1902)   Pt had good results, is feeling better. Ready to go home, discussed further treatment.   Results for orders placed during the hospital encounter of 05/13/13  CBC WITH DIFFERENTIAL      Result Value Range   WBC 10.4  4.0 - 10.5 K/uL   RBC 4.96  4.22 - 5.81 MIL/uL   Hemoglobin 13.3  13.0 - 17.0 g/dL  HCT 41.2  39.0 - 52.0 %   MCV 83.1  78.0 - 100.0 fL   MCH 26.8  26.0 - 34.0 pg   MCHC  32.3  30.0 - 36.0 g/dL   RDW 91.4  78.2 - 95.6 %   Platelets 138 (*) 150 - 400 K/uL   Neutrophils Relative % 84 (*) 43 - 77 %   Neutro Abs 8.8 (*) 1.7 - 7.7 K/uL   Lymphocytes Relative 8 (*) 12 - 46 %   Lymphs Abs 0.8  0.7 - 4.0 K/uL   Monocytes Relative 8  3 - 12 %   Monocytes Absolute 0.8  0.1 - 1.0 K/uL   Eosinophils Relative 0  0 - 5 %   Eosinophils Absolute 0.0  0.0 - 0.7 K/uL   Basophils Relative 0  0 - 1 %   Basophils Absolute 0.0  0.0 - 0.1 K/uL  COMPREHENSIVE METABOLIC PANEL      Result Value Range   Sodium 138  135 - 145 mEq/L   Potassium 4.1  3.5 - 5.1 mEq/L   Chloride 99  96 - 112 mEq/L   CO2 28  19 - 32 mEq/L   Glucose, Bld 122 (*) 70 - 99 mg/dL   BUN 21  6 - 23 mg/dL   Creatinine, Ser 2.13  0.50 - 1.35 mg/dL   Calcium 9.6  8.4 - 08.6 mg/dL   Total Protein 7.9  6.0 - 8.3 g/dL   Albumin 4.1  3.5 - 5.2 g/dL   AST 31  0 - 37 U/L   ALT 5  0 - 53 U/L   Alkaline Phosphatase 88  39 - 117 U/L   Total Bilirubin 1.0  0.3 - 1.2 mg/dL   GFR calc non Af Amer 87 (*) >90 mL/min   GFR calc Af Amer >90  >90 mL/min  URINALYSIS, ROUTINE W REFLEX MICROSCOPIC      Result Value Range   Color, Urine YELLOW  YELLOW   APPearance CLEAR  CLEAR   Specific Gravity, Urine 1.019  1.005 - 1.030   pH 5.5  5.0 - 8.0   Glucose, UA NEGATIVE  NEGATIVE mg/dL   Hgb urine dipstick NEGATIVE  NEGATIVE   Bilirubin Urine NEGATIVE  NEGATIVE   Ketones, ur NEGATIVE  NEGATIVE mg/dL   Protein, ur NEGATIVE  NEGATIVE mg/dL   Urobilinogen, UA 0.2  0.0 - 1.0 mg/dL   Nitrite NEGATIVE  NEGATIVE   Leukocytes, UA NEGATIVE  NEGATIVE   Laboratory interpretation all normal     Dg Abd Acute W/chest  05/13/2013   *RADIOLOGY REPORT*  Clinical Data: Abdominal pain, diarrhea  ACUTE ABDOMEN SERIES (ABDOMEN 2 VIEW & CHEST 1 VIEW)  Comparison: CT abdomen/pelvis 05/18/2009  Findings: The lungs are clear.  Cardiac and mediastinal contours are within normal limits given frontal technique.  No large effusion or pneumothorax.   Levoconvex scoliosis of the lumbar spine with multilevel degenerative disc disease and lower lumbar facet arthropathy. Surgical changes of prior right total hip arthroplasty are noted but incompletely imaged.  There is moderately advanced left hip osteoarthritis.  The bowel gas pattern is not obstructed.  There is a moderately large volume of formed stool in the rectum and descending colon suggesting an element of constipation.  Surgical clips project over the right hemiabdomen.  IMPRESSION:  1.  No acute cardiopulmonary disease 2.  Nonobstructed bowel gas pattern 3.  Large volume of formed stool in the rectum and descending colon suggests underlying constipation.  Query  possible overflow incontinence/diarrhea.   Original Report Authenticated By: Malachy Moan, M.D.     1. Abdominal pain   2. Constipation     Plan discharge  Devoria Albe, MD, FACEP   MDM          Ward Givens, MD 05/13/13 2011

## 2013-05-13 NOTE — ED Notes (Signed)
Pt requested to go to bathroom in room. Pt helped to toilet with assistance from [place], EMT. Pt having BM without being given the suppository.

## 2013-05-13 NOTE — ED Notes (Signed)
Pt sts having small BM "tiny pieces" and "burning when it comes out". Pt requesting suppository

## 2013-05-13 NOTE — ED Notes (Signed)
Dr.Knapp at bedside  

## 2013-05-13 NOTE — ED Notes (Signed)
Devoria Albe, MD at bedside

## 2013-05-13 NOTE — ED Notes (Signed)
Pt alert and mentating appropriately upon d/c. Pt ambulatory with cane from home upon d/c but requested wheelchair to get to vehicle. Pt taken out to friend's car and helped into vehicle. Pt given d/c teaching and follow up care instructions. Pt verbalizes understanding and has no further questions upon d/c. NAD noted upon d/c. Pt leaving with wife.

## 2013-05-13 NOTE — ED Notes (Signed)
Pt c/o abd pain and thinks he could be constipated; pt sts belching but not passing gas

## 2013-05-22 ENCOUNTER — Ambulatory Visit (INDEPENDENT_AMBULATORY_CARE_PROVIDER_SITE_OTHER): Payer: Medicare Other | Admitting: Family Medicine

## 2013-05-22 ENCOUNTER — Telehealth: Payer: Self-pay | Admitting: Internal Medicine

## 2013-05-22 ENCOUNTER — Encounter: Payer: Self-pay | Admitting: Family Medicine

## 2013-05-22 VITALS — BP 130/82 | Temp 98.0°F | Wt 247.0 lb

## 2013-05-22 DIAGNOSIS — K5900 Constipation, unspecified: Secondary | ICD-10-CM | POA: Diagnosis not present

## 2013-05-22 NOTE — Patient Instructions (Signed)
-  fiber supplement in juice or water every morning (metameucil or citracel)  -miralax once daily for 3 days, if no results twice daily until results, then continue for 3 days  -anytime you do not have BM for 3 days, restart miralax  -can drink prune juice a glass a day as well  -follow up with doctor Swords at next available appointment

## 2013-05-22 NOTE — Telephone Encounter (Signed)
Patient Information:  Caller Name: Corrie Dandy  Phone: [phone]  Patient: Walter Gill, Walter Gill  Gender: Male  DOB: 10-01-1934  Age: 79 Years  PCP: Birdie Sons (Adults only)  Office Follow Up:  Does the office need to follow up with this patient?: No  Instructions For The Office: N/A   Symptoms  Reason For Call & Symptoms: Today, 05/22/2013, Spouse calling stating pt is constipated and hasn't had BM since  05/15/2013. He had ER visit 05/13/2013 for abdomen pain ,   and advised Miralax .Marland KitchenHe had 8 doses and finally had stool , but none since 05/15/2013. Pt drank Prune Juice last  night and MOM and only been passing gas.  Abdomen is swollen , but denies pain. No vomiting.  Reviewed Health History In EMR: Yes  Reviewed Medications In EMR: Yes  Reviewed Allergies In EMR: Yes  Reviewed Surgeries / Procedures: Yes  Date of Onset of Symptoms: 05/15/2013  Treatments Tried: Miralax  Treatments Tried Worked: No  Guideline(s) Used:  Constipation  Constipation  Disposition Per Guideline:   See Today in Office  Reason For Disposition Reached:   Abdomen is more swollen than usual  Advice Given:  Liquids:  Drink 6-8 glasses of water a day (Caution: certain medical conditions require fluid restriction).  Prune juice is a natural laxative.  Call Back If:  You become worse  Patient Will Follow Care Advice:  YES  Appointment Scheduled:  05/22/2013 10:45:00 Appointment Scheduled Provider:  Kriste Basque [facility])

## 2013-05-22 NOTE — Progress Notes (Signed)
Chief Complaint  Patient presents with  . Fecal Impaction    abdominal swelling,    HPI:  Acute visit for constipation: -started a few weeks ago but has issues on and off for years -last BM 1 week ago, has had a few small BMs, but not much, straining -prune juice and milk and magnesium yesterday -did go to ER on the second and had a normal movement then and a few days later after some mirilax -denies: abd pain, fevers, chills, inability to tolerate fluids, nausea or vomiting -had some swelling and pain in abdbefore going to ED on 2nd  ROS: See pertinent positives and negatives per HPI.  Past Medical History  Diagnosis Date  . Sleep apnea   . RBBB (right bundle branch block)   . Parkinson disease   . Gout   . Hyperlipidemia   . Hypertension   . OA (osteoarthritis)   . PE (pulmonary embolism) september 2008  . DVT (deep venous thrombosis)   . Left ventricular dysfunction     impaired LV relaxation    Family History  Problem Relation Age of Onset  . Heart disease Mother   . Cancer Father     possible gi    History   Social History  . Marital Status: Married    Spouse Name: N/A    Number of Children: N/A  . Years of Education: N/A   Occupational History  . 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 Smoking status: Never Smoker   . Smokeless tobacco: Never Used  . Alcohol Use: No  . Drug Use: No  . Sexually Active: None   Other Topics Concern  . None   Social History Narrative  . None    Current outpatient prescriptions:allopurinol (ZYLOPRIM) 100 MG tablet, Take 100 mg by mouth daily., Disp: , Rfl: ;  aspirin 81 MG chewable tablet, Chew 81 mg by mouth at bedtime. , Disp: , Rfl: ;  carbidopa-levodopa (SINEMET CR) 50-200 MG per tablet, Take 1 tablet by mouth 5 (five) times daily., Disp: , Rfl: ;  clotrimazole-betamethasone (LOTRISONE) cream, Apply 1 application topically 2 (two) times daily., Disp: 30 g, Rfl: 5 docusate sodium (COLACE) 100 MG  capsule, Take 100 mg by mouth 2 (two) times daily.  , Disp: , Rfl: ;  furosemide (LASIX) 40 MG tablet, Take 40 mg by mouth 2 (two) times daily., Disp: , Rfl: ;  HYDROcodone-acetaminophen (NORCO) 10-325 MG per tablet, Take 1 tablet by mouth 2 (two) times daily as needed for pain., Disp: , Rfl: ;  Mirabegron ER (MYRBETRIQ) 25 MG TB24, Take 25 mg by mouth at bedtime. , Disp: , Rfl:  nitrofurantoin, macrocrystal-monohydrate, (MACROBID) 100 MG capsule, Take 100 mg by mouth at bedtime. , Disp: , Rfl: ;  polyethylene glycol powder (GLYCOLAX/MIRALAX) powder, Take 17 g by mouth daily as needed (constipation). , Disp: , Rfl: ;  potassium chloride (KLOR-CON) 20 MEQ packet, Take 20 mEq by mouth 2 (two) times daily., Disp: , Rfl:  ropinirole (REQUIP) 5 MG tablet, Take 1 tablet (5 mg total) by mouth 3 (three) times daily., Disp: 270 tablet, Rfl: 3;  rosuvastatin (CRESTOR) 10 MG tablet, Take 10 mg by mouth at bedtime., Disp: , Rfl: ;  SANCTURA XR 60 MG CP24, Take 60 mg by mouth every morning. , Disp: , Rfl: ;  selegiline (ELDEPRYL) 5 MG capsule, Take 5 mg by mouth 2 (two) times daily before a meal., Disp: , Rfl:  tamsulosin (FLOMAX) 0.4  MG CAPS, Take 0.4 mg by mouth at bedtime., Disp: , Rfl:   EXAM:  Filed Vitals:   05/22/13 1038  BP: 130/82  Temp: 98 F (36.7 C)    Body mass index is 35.44 kg/(m^2).  GENERAL: vitals reviewed and listed above, alert, oriented, appears well hydrated and in no acute distress  HEENT: atraumatic, conjunttiva clear, no obvious abnormalities on inspection of external nose and ears  NECK: no obvious masses on inspection  LUNGS: clear to auscultation bilaterally, no wheezes, rales or rhonchi, good air movement  CV: HRRR, no peripheral edema  MS: moves all extremities without noticeable abnormality  PSYCH: pleasant and cooperative, no obvious depression or anxiety  ASSESSMENT AND PLAN:  Discussed the following assessment and plan:  Constipation  -Patient advised to  return or notify a doctor immediately if symptoms worsen or persist or new concerns arise.  Patient Instructions  -fiber supplement in juice or water every morning (metameucil or citracel)  -miralax once daily for 3 days, if no results twice daily until results, then continue for 3 days  -anytime you do not have BM for 3 days, restart miralax  -can drink prune juice a glass a day as well  -follow up with doctor Swords at next available appointment     Anjolina Byrer, Dahlia Client R.

## 2013-05-31 DIAGNOSIS — R350 Frequency of micturition: Secondary | ICD-10-CM | POA: Diagnosis not present

## 2013-05-31 DIAGNOSIS — R3915 Urgency of urination: Secondary | ICD-10-CM | POA: Diagnosis not present

## 2013-06-06 ENCOUNTER — Encounter: Payer: Self-pay | Admitting: Neurology

## 2013-06-06 ENCOUNTER — Ambulatory Visit (INDEPENDENT_AMBULATORY_CARE_PROVIDER_SITE_OTHER): Payer: Medicare Other | Admitting: Neurology

## 2013-06-06 ENCOUNTER — Telehealth: Payer: Self-pay | Admitting: Internal Medicine

## 2013-06-06 VITALS — BP 112/70 | HR 72 | Temp 98.0°F | Resp 16 | Wt 243.0 lb

## 2013-06-06 DIAGNOSIS — E538 Deficiency of other specified B group vitamins: Secondary | ICD-10-CM

## 2013-06-06 DIAGNOSIS — G4752 REM sleep behavior disorder: Secondary | ICD-10-CM | POA: Diagnosis not present

## 2013-06-06 DIAGNOSIS — G20 Parkinson's disease: Secondary | ICD-10-CM | POA: Diagnosis not present

## 2013-06-06 NOTE — Telephone Encounter (Signed)
Wife following up on paperwork for CPAP machine. [facility] states they have not received.

## 2013-06-06 NOTE — Patient Instructions (Addendum)
1.  I need you to start taking B12 - daily 2.  Follow up in four months. 3.  Call us if you need [place] before then. 4.  Behave!!  Constipation and Parkinson's disease:  1.Rancho recipe for constipation in Parkinsons Disease:  -1 cup of bran, 2 cups of applesauce in 1 cup of prune juice 2.  Increase fiber intake (Metamucil,vegetables) 3.  Regular, moderate exercise can be beneficial. 4.  Avoid medications causing constipation, such as medications like antacids with calcium or magnesium 5.  Laxative overuse should be avoided. 6.  Stool softeners (Colace) can help with chronic constipation.
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
## 2013-06-07 ENCOUNTER — Other Ambulatory Visit: Payer: Self-pay | Admitting: Internal Medicine

## 2013-06-10 NOTE — Telephone Encounter (Signed)
This was sent up front to be faxed

## 2013-07-03 ENCOUNTER — Other Ambulatory Visit: Payer: Self-pay | Admitting: Internal Medicine

## 2013-07-05 ENCOUNTER — Other Ambulatory Visit: Payer: Self-pay | Admitting: Internal Medicine

## 2013-07-12 DIAGNOSIS — R350 Frequency of micturition: Secondary | ICD-10-CM | POA: Diagnosis not present

## 2013-07-12 DIAGNOSIS — R3915 Urgency of urination: Secondary | ICD-10-CM | POA: Diagnosis not present

## 2013-07-17 ENCOUNTER — Telehealth: Payer: Self-pay | Admitting: Internal Medicine

## 2013-07-17 NOTE — Telephone Encounter (Signed)
No he will have to be seen for face to face encounter.  See if pt can come in 09/20/13 at 9:15

## 2013-07-17 NOTE — Telephone Encounter (Addendum)
Pt states he has received his new cpap machine and medicare requires that he fu w/ PCP within 2-3 months.  Pt will be out of town 10/11-1022 which is first available. Or can Dr swords sign off? Pls advise.

## 2013-07-17 NOTE — Telephone Encounter (Signed)
appt made for 10/10 @ 9:15am

## 2013-08-02 DIAGNOSIS — R3915 Urgency of urination: Secondary | ICD-10-CM | POA: Diagnosis not present

## 2013-08-02 DIAGNOSIS — R350 Frequency of micturition: Secondary | ICD-10-CM | POA: Diagnosis not present

## 2013-08-07 DIAGNOSIS — H353190 Nonexudative age-related macular degeneration, unspecified eye, stage unspecified: Secondary | ICD-10-CM | POA: Diagnosis not present

## 2013-08-15 ENCOUNTER — Telehealth: Payer: Self-pay

## 2013-08-15 NOTE — Telephone Encounter (Signed)
yes

## 2013-08-15 NOTE — Telephone Encounter (Signed)
Pt's wife left msg on vm, he has been doing oral B12, but is still feeling tired, they would like to know if he can switch to monthly b12 injections instead.

## 2013-08-16 NOTE — Telephone Encounter (Signed)
Pt and wife aware and appt made for 9/10

## 2013-08-21 ENCOUNTER — Other Ambulatory Visit (INDEPENDENT_AMBULATORY_CARE_PROVIDER_SITE_OTHER): Payer: Medicare Other

## 2013-08-21 DIAGNOSIS — D518 Other vitamin B12 deficiency anemias: Secondary | ICD-10-CM

## 2013-08-21 MED ORDER — CYANOCOBALAMIN 1000 MCG/ML IJ SOLN
1000.0000 ug | Freq: Once | INTRAMUSCULAR | Status: AC
  Administered 2013-08-21: 1000 ug via INTRAMUSCULAR

## 2013-08-23 DIAGNOSIS — R350 Frequency of micturition: Secondary | ICD-10-CM | POA: Diagnosis not present

## 2013-08-23 DIAGNOSIS — R3915 Urgency of urination: Secondary | ICD-10-CM | POA: Diagnosis not present

## 2013-08-28 ENCOUNTER — Other Ambulatory Visit: Payer: Self-pay | Admitting: Internal Medicine

## 2013-08-28 ENCOUNTER — Telehealth: Payer: Self-pay

## 2013-08-28 MED ORDER — SELEGILINE HCL 5 MG PO CAPS: 5.0000 mg | ORAL_CAPSULE | Freq: Two times a day (BID) | ORAL | Status: DC

## 2013-08-28 NOTE — Telephone Encounter (Signed)
Not sure that I even knew he was still on this med (don't think that I gave it but may have).  Okay, 5 mg bid, #180, RF 3

## 2013-08-28 NOTE — Telephone Encounter (Signed)
Rx sent to pt's pharmacy

## 2013-08-28 NOTE — Telephone Encounter (Signed)
Faxed Eldepryl refill request from [facility]

## 2013-09-05 ENCOUNTER — Other Ambulatory Visit: Payer: Self-pay | Admitting: Internal Medicine

## 2013-09-08 DIAGNOSIS — Z23 Encounter for immunization: Secondary | ICD-10-CM | POA: Diagnosis not present

## 2013-09-13 ENCOUNTER — Ambulatory Visit (INDEPENDENT_AMBULATORY_CARE_PROVIDER_SITE_OTHER): Payer: Medicare Other | Admitting: Neurology

## 2013-09-13 VITALS — BP 136/80 | HR 80 | Temp 97.9°F | Resp 20 | Wt 249.9 lb

## 2013-09-13 DIAGNOSIS — E538 Deficiency of other specified B group vitamins: Secondary | ICD-10-CM | POA: Diagnosis not present

## 2013-09-13 DIAGNOSIS — G20 Parkinson's disease: Secondary | ICD-10-CM | POA: Diagnosis not present

## 2013-09-13 DIAGNOSIS — G4719 Other hypersomnia: Secondary | ICD-10-CM

## 2013-09-13 DIAGNOSIS — R350 Frequency of micturition: Secondary | ICD-10-CM | POA: Diagnosis not present

## 2013-09-13 DIAGNOSIS — G4710 Hypersomnia, unspecified: Secondary | ICD-10-CM | POA: Diagnosis not present

## 2013-09-13 DIAGNOSIS — R3915 Urgency of urination: Secondary | ICD-10-CM | POA: Diagnosis not present

## 2013-09-13 MED ORDER — CYANOCOBALAMIN 1000 MCG/ML IJ SOLN
1000.0000 ug | INTRAMUSCULAR | Status: DC
  Administered 2013-09-13: 1000 ug via INTRAMUSCULAR

## 2013-09-13 MED ORDER — ROPINIROLE HCL 2 MG PO TABS: 2.0000 mg | ORAL_TABLET | Freq: Three times a day (TID) | ORAL | Status: DC

## 2013-09-13 NOTE — Patient Instructions (Addendum)
1.  Decrease requip as follows:  5 mg in AM, 5 mg noon and 2 mg at dinner for a week, then 5 mg in AM, 2 mg at noon and 2 mg at dinner for a week, then 2 mg three times per day 2.  Call me after you go to [place] and let me know if you would like to start the parkinsons therapy 3.  Please make a follow up appointment for December on your way out today. Thank You!
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
## 2013-09-18 ENCOUNTER — Other Ambulatory Visit: Payer: Medicare Other

## 2013-09-20 ENCOUNTER — Encounter: Payer: Self-pay | Admitting: Internal Medicine

## 2013-09-20 ENCOUNTER — Ambulatory Visit (INDEPENDENT_AMBULATORY_CARE_PROVIDER_SITE_OTHER): Payer: Medicare Other | Admitting: Internal Medicine

## 2013-09-20 VITALS — BP 120/80 | HR 84 | Temp 98.0°F | Wt 250.0 lb

## 2013-09-20 DIAGNOSIS — E669 Obesity, unspecified: Secondary | ICD-10-CM | POA: Insufficient documentation

## 2013-09-20 DIAGNOSIS — G20 Parkinson's disease: Secondary | ICD-10-CM | POA: Diagnosis not present

## 2013-09-20 DIAGNOSIS — G4733 Obstructive sleep apnea (adult) (pediatric): Secondary | ICD-10-CM | POA: Diagnosis not present

## 2013-09-20 NOTE — Assessment & Plan Note (Signed)
Using CPAP nightly Good results Continue same

## 2013-09-20 NOTE — Progress Notes (Signed)
Walter Gill- patient has sleep apnea that is well documented- He is using CPAP nightly and subjectively feels better.  Parkinosons- f/u with neurology  Lipids- tolerating meds  Past Medical History  Diagnosis Date  . Sleep apnea   . RBBB (right bundle branch block)   . Walter Gill   . Gout   . Hyperlipidemia   . Hypertension   . OA (osteoarthritis)   . PE (pulmonary embolism) september 2008  . DVT (deep venous thrombosis)   . Left ventricular dysfunction     impaired LV relaxation    History   Social History  . Marital Status: Married    Spouse Name: N/A    Number of Children: N/A  . Years of Education: N/A   Occupational History  . 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 Smoking status: Never Smoker   . Smokeless tobacco: Never Used  . Alcohol Use: No  . Drug Use: No  . Sexual Activity: Not on file   Other Topics Concern  . Not on file   Social History Narrative  . No narrative on file    Past Surgical History  Procedure Laterality Date  . Appendectomy    . Cholecystectomy    . Dupruytens contracture    . Total hip arthroplasty  9/8/8    right  . Pilonidal cyst removal    . Eye surgery      ? for lid lag vs apraxia of eye opening  . Cataract extraction      Family History  Problem Relation Age of Onset  . Heart Gill Mother   . Cancer Father     possible gi    No Known Allergies  Current Outpatient Prescriptions on File Prior to Visit  Medication Sig Dispense Refill  . allopurinol (ZYLOPRIM) 100 MG tablet TAKE 1 TABLET EVERY DAY  90 tablet  1  . aspirin 81 MG chewable tablet Chew 81 mg by mouth at bedtime.       . carbidopa-levodopa (SINEMET CR) 50-200 MG per tablet Take 1 tablet by mouth 5 (five) times daily.      Marland Kitchen docusate sodium (COLACE) 100 MG capsule Take 100 mg by mouth 2 (two) times daily.        . furosemide (LASIX) 40 MG tablet TAKE 1 TABLET BY MOUTH TWICE A DAY  60 tablet  1  . HYDROcodone-acetaminophen (NORCO)  10-325 MG per tablet TAKE 1 TABLET BY MOUTH TWICE A DAY AS NEEDED FOR PAIN  60 tablet  1  . Mirabegron ER (MYRBETRIQ) 25 MG TB24 Take 25 mg by mouth at bedtime.       . nitrofurantoin, macrocrystal-monohydrate, (MACROBID) 100 MG capsule Take 100 mg by mouth at bedtime.       . polyethylene glycol powder (GLYCOLAX/MIRALAX) powder Take 17 g by mouth daily as needed (constipation).       . rosuvastatin (CRESTOR) 10 MG tablet Take 10 mg by mouth at bedtime.      Marland Kitchen SANCTURA XR 60 MG CP24 Take 60 mg by mouth every morning.       . selegiline (ELDEPRYL) 5 MG capsule Take 1 capsule (5 mg total) by mouth 2 (two) times daily before a meal.  180 capsule  3  . tamsulosin (FLOMAX) 0.4 MG CAPS Take 0.4 mg by mouth at bedtime.       No current facility-administered medications on file prior to visit.     patient denies chest pain, shortness of  breath, orthopnea. Denies lower extremity edema, abdominal pain, change in appetite, change in bowel movements. Patient denies rashes, musculoskeletal complaints. No other specific complaints in a complete review of systems.   BP 120/80  Pulse 84  Temp(Src) 98 F (36.7 C) (Oral)  Wt 250 lb (113.399 kg)  BMI 35.87 kg/m2  SpO2 97%  well-developed well-nourished male in no acute distress. HEENT exam atraumatic, normocephalic, neck supple without jugular venous distention. Chest clear to auscultation cardiac exam S1-S2 are regular. Abdominal exam overweight with bowel sounds, soft and nontender. Extremities 2+  Edema bilateral LE. Neurologic exam is alert with an antalgic gait

## 2013-09-22 NOTE — Assessment & Plan Note (Signed)
Has regular f/u with neurology 

## 2013-10-04 ENCOUNTER — Encounter: Payer: Self-pay | Admitting: Internal Medicine

## 2013-10-04 ENCOUNTER — Ambulatory Visit (INDEPENDENT_AMBULATORY_CARE_PROVIDER_SITE_OTHER): Payer: Medicare Other | Admitting: Internal Medicine

## 2013-10-04 VITALS — BP 146/90 | HR 80 | Temp 98.2°F | Resp 20 | Wt 254.0 lb

## 2013-10-04 DIAGNOSIS — G20 Parkinson's disease: Secondary | ICD-10-CM | POA: Diagnosis not present

## 2013-10-04 DIAGNOSIS — R5381 Other malaise: Secondary | ICD-10-CM

## 2013-10-04 DIAGNOSIS — E785 Hyperlipidemia, unspecified: Secondary | ICD-10-CM

## 2013-10-04 DIAGNOSIS — R350 Frequency of micturition: Secondary | ICD-10-CM | POA: Diagnosis not present

## 2013-10-04 DIAGNOSIS — I10 Essential (primary) hypertension: Secondary | ICD-10-CM

## 2013-10-04 DIAGNOSIS — R609 Edema, unspecified: Secondary | ICD-10-CM | POA: Diagnosis not present

## 2013-10-04 DIAGNOSIS — I519 Heart disease, unspecified: Secondary | ICD-10-CM | POA: Diagnosis not present

## 2013-10-04 DIAGNOSIS — R3915 Urgency of urination: Secondary | ICD-10-CM | POA: Diagnosis not present

## 2013-10-04 LAB — LIPID PANEL
LDL Cholesterol: 27 mg/dL (ref 0–99)
VLDL: 32 mg/dL (ref 0.0–40.0)

## 2013-10-04 LAB — TSH: TSH: 1.26 u[IU]/mL (ref 0.35–5.50)

## 2013-10-04 LAB — COMPREHENSIVE METABOLIC PANEL
AST: 26 U/L (ref 0–37)
Albumin: 4.2 g/dL (ref 3.5–5.2)
Alkaline Phosphatase: 83 U/L (ref 39–117)
Potassium: 3.9 mEq/L (ref 3.5–5.1)
Sodium: 138 mEq/L (ref 135–145)
Total Protein: 7.5 g/dL (ref 6.0–8.3)

## 2013-10-04 NOTE — Progress Notes (Signed)
Subjective:    Patient ID: Walter Gill, male    DOB: 05/30/1934, 79 y.o.   MRN: [id number]  HPI  79 year old patient who has multiple medical problems. His closely by neurology due to Parkinson's disease. His PD  medications are in the process of titration. He complains of some fatigue this seems fairly chronic.  He is accompanied by his wife who wonders if the screening blood work would be appropriate. In general doing fairly well he is on CPAP for OSA. He does have a history of obesity and chronic lower extremity edema. Remains on Crestor for dyslipidemia. No recent lab except for a CBC. He does have a history of B12 deficiency and receives monthly injections  Past Medical History  Diagnosis Date  . Sleep apnea   . RBBB (right bundle branch block)   . Parkinson disease   . Gout   . Hyperlipidemia   . Hypertension   . OA (osteoarthritis)   . PE (pulmonary embolism) september 2008  . DVT (deep venous thrombosis)   . Left ventricular dysfunction     impaired LV relaxation    History   Social History  . Marital Status: Married    Spouse Name: N/A    Number of Children: N/A  . Years of Education: N/A   Occupational History  . 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 Smoking status: Never Smoker   . Smokeless tobacco: Never Used  . Alcohol Use: No  . Drug Use: No  . Sexual Activity: Not on file   Other Topics Concern  . Not on file   Social History Narrative  . No narrative on file    Past Surgical History  Procedure Laterality Date  . Appendectomy    . Cholecystectomy    . Dupruytens contracture    . Total hip arthroplasty  9/8/8    right  . Pilonidal cyst removal    . Eye surgery      ? for lid lag vs apraxia of eye opening  . Cataract extraction      Family History  Problem Relation Age of Onset  . Heart disease Mother   . Cancer Father     possible gi    No Known Allergies  Current Outpatient Prescriptions on File Prior to Visit   Medication Sig Dispense Refill  . allopurinol (ZYLOPRIM) 100 MG tablet TAKE 1 TABLET EVERY DAY  90 tablet  1  . aspirin 81 MG chewable tablet Chew 81 mg by mouth at bedtime.       . carbidopa-levodopa (SINEMET CR) 50-200 MG per tablet Take 1 tablet by mouth 5 (five) times daily.      Marland Kitchen docusate sodium (COLACE) 100 MG capsule Take 100 mg by mouth 2 (two) times daily.        . furosemide (LASIX) 40 MG tablet TAKE 1 TABLET BY MOUTH TWICE A DAY  60 tablet  1  . HYDROcodone-acetaminophen (NORCO) 10-325 MG per tablet TAKE 1 TABLET BY MOUTH TWICE A DAY AS NEEDED FOR PAIN  60 tablet  1  . KLOR-CON M20 20 MEQ tablet       . Mirabegron ER (MYRBETRIQ) 25 MG TB24 Take 25 mg by mouth at bedtime.       . nitrofurantoin, macrocrystal-monohydrate, (MACROBID) 100 MG capsule Take 100 mg by mouth at bedtime.       . polyethylene glycol powder (GLYCOLAX/MIRALAX) powder Take 17 g by mouth daily as needed (constipation).       Marland Kitchen  ropinirole (REQUIP) 5 MG tablet Take 5 mg by mouth 3 (three) times daily.       . rosuvastatin (CRESTOR) 10 MG tablet Take 10 mg by mouth at bedtime.      Marland Kitchen SANCTURA XR 60 MG CP24 Take 60 mg by mouth every morning.       . selegiline (ELDEPRYL) 5 MG capsule Take 1 capsule (5 mg total) by mouth 2 (two) times daily before a meal.  180 capsule  3  . tamsulosin (FLOMAX) 0.4 MG CAPS Take 0.4 mg by mouth at bedtime.      Marland Kitchen rOPINIRole (REQUIP) 2 MG tablet Take 2 mg by mouth daily.       No current facility-administered medications on file prior to visit.    BP 146/90  Pulse 80  Temp(Src) 98.2 F (36.8 C) (Oral)  Resp 20  Wt 254 lb (115.214 kg)  BMI 36.45 kg/m2  SpO2 96%       Review of Systems  Constitutional: Positive for fatigue. Negative for fever, chills and appetite change.  HENT: Negative for congestion, dental problem, ear pain, hearing loss, sore throat, tinnitus, trouble swallowing and voice change.   Eyes: Negative for pain, discharge and visual disturbance.   Respiratory: Negative for cough, chest tightness, wheezing and stridor.   Cardiovascular: Positive for leg swelling. Negative for chest pain and palpitations.  Gastrointestinal: Negative for nausea, vomiting, abdominal pain, diarrhea, constipation, blood in stool and abdominal distention.  Genitourinary: Negative for urgency, hematuria, flank pain, discharge, difficulty urinating and genital sores.  Musculoskeletal: Positive for gait problem. Negative for arthralgias, back pain, joint swelling, myalgias and neck stiffness.  Skin: Negative for rash.  Neurological: Negative for dizziness, syncope, speech difficulty, weakness, numbness and headaches.  Hematological: Negative for adenopathy. Does not bruise/bleed easily.  Psychiatric/Behavioral: Negative for behavioral problems and dysphoric mood. The patient is not nervous/anxious.        Objective:   Physical Exam  Constitutional: He is oriented to person, place, and time. He appears well-developed. No distress.  Obese Repeat blood pressure 130/80 Weight 254  HENT:  Head: Normocephalic.  Right Ear: External ear normal.  Left Ear: External ear normal.  Eyes: Conjunctivae and EOM are normal.  Neck: Normal range of motion.  Cardiovascular: Normal rate and normal heart sounds.   Pulmonary/Chest: Breath sounds normal.  Abdominal: Bowel sounds are normal.  Musculoskeletal: Normal range of motion. He exhibits edema. He exhibits no tenderness.  Prominent lower extremity edema distal to the knees  Neurological: He is alert and oriented to person, place, and time.  Psychiatric: He has a normal mood and affect. His behavior is normal.          Assessment & Plan:   Parkinson's disease. Chronic fatigue. Multifactorial Dyslipidemia. We'll check a lipid profile Hypertension stable  We'll review laboratory uptake Low-salt diet recommended Weight loss encouraged

## 2013-10-04 NOTE — Patient Instructions (Signed)
Limit your sodium (Salt) intake  You need to lose weight.  Consider a lower calorie diet and regular exercise.  Neurology followup as discussed

## 2013-10-08 ENCOUNTER — Telehealth: Payer: Self-pay | Admitting: Internal Medicine

## 2013-10-08 MED ORDER — HYDROCODONE-ACETAMINOPHEN 10-325 MG PO TABS: ORAL_TABLET | ORAL | Status: DC

## 2013-10-08 NOTE — Telephone Encounter (Signed)
Will you sign this for me?  Dr Swords is out of the office till Friday 

## 2013-10-08 NOTE — Telephone Encounter (Signed)
rx up front for p/u, pt aware 

## 2013-10-08 NOTE — Telephone Encounter (Signed)
Pt needs new rx hydrocodone °

## 2013-10-08 NOTE — Telephone Encounter (Signed)
ok 

## 2013-10-10 ENCOUNTER — Ambulatory Visit: Payer: Medicare Other | Admitting: Neurology

## 2013-10-16 ENCOUNTER — Telehealth: Payer: Self-pay | Admitting: Internal Medicine

## 2013-10-16 NOTE — Telephone Encounter (Signed)
Requesting results from labs done on 10/04/13

## 2013-10-16 NOTE — Telephone Encounter (Signed)
Pt calling for lab results done 10/24.

## 2013-10-17 NOTE — Telephone Encounter (Signed)
Please call/notify patient that lab/test/procedure is normal 

## 2013-10-17 NOTE — Telephone Encounter (Signed)
Spoke to pt's wife Nita Sells, she said she looked on line and saw everything was normal. Told her yes labs were normal.

## 2013-10-24 ENCOUNTER — Ambulatory Visit: Payer: Medicare Other

## 2013-10-31 ENCOUNTER — Ambulatory Visit (INDEPENDENT_AMBULATORY_CARE_PROVIDER_SITE_OTHER): Payer: Medicare Other

## 2013-10-31 DIAGNOSIS — D518 Other vitamin B12 deficiency anemias: Secondary | ICD-10-CM

## 2013-10-31 DIAGNOSIS — E538 Deficiency of other specified B group vitamins: Secondary | ICD-10-CM

## 2013-10-31 MED ORDER — CYANOCOBALAMIN 1000 MCG/ML IJ SOLN
1000.0000 ug | Freq: Once | INTRAMUSCULAR | Status: AC
  Administered 2013-10-31: 1000 ug via INTRAMUSCULAR

## 2013-11-01 DIAGNOSIS — N401 Enlarged prostate with lower urinary tract symptoms: Secondary | ICD-10-CM | POA: Diagnosis not present

## 2013-11-01 DIAGNOSIS — N139 Obstructive and reflux uropathy, unspecified: Secondary | ICD-10-CM | POA: Diagnosis not present

## 2013-11-01 DIAGNOSIS — N318 Other neuromuscular dysfunction of bladder: Secondary | ICD-10-CM | POA: Diagnosis not present

## 2013-11-08 ENCOUNTER — Telehealth: Payer: Self-pay | Admitting: Internal Medicine

## 2013-11-08 MED ORDER — HYDROCODONE-ACETAMINOPHEN 10-325 MG PO TABS: ORAL_TABLET | ORAL | Status: DC

## 2013-11-08 NOTE — Telephone Encounter (Signed)
rx ready for pickup 

## 2013-11-08 NOTE — Telephone Encounter (Signed)
Pt request refill of hydrocodone 10-325

## 2013-11-15 DIAGNOSIS — R3915 Urgency of urination: Secondary | ICD-10-CM | POA: Diagnosis not present

## 2013-11-15 DIAGNOSIS — R350 Frequency of micturition: Secondary | ICD-10-CM | POA: Diagnosis not present

## 2013-11-28 ENCOUNTER — Ambulatory Visit (INDEPENDENT_AMBULATORY_CARE_PROVIDER_SITE_OTHER): Payer: Medicare Other | Admitting: Neurology

## 2013-11-28 ENCOUNTER — Encounter: Payer: Self-pay | Admitting: Neurology

## 2013-11-28 VITALS — BP 114/64 | HR 68 | Temp 97.4°F | Resp 12 | Ht 65.0 in | Wt 248.7 lb

## 2013-11-28 DIAGNOSIS — W19XXXD Unspecified fall, subsequent encounter: Secondary | ICD-10-CM

## 2013-11-28 DIAGNOSIS — W19XXXA Unspecified fall, initial encounter: Secondary | ICD-10-CM

## 2013-11-28 DIAGNOSIS — G20 Parkinson's disease: Secondary | ICD-10-CM

## 2013-11-28 MED ORDER — ROPINIROLE HCL 1 MG PO TABS: 1.0000 mg | ORAL_TABLET | Freq: Three times a day (TID) | ORAL | Status: DC

## 2013-11-28 MED ORDER — CYANOCOBALAMIN 1000 MCG/ML IJ SOLN
1000.0000 ug | Freq: Once | INTRAMUSCULAR | Status: AC
  Administered 2013-11-28: 1000 ug via INTRAMUSCULAR
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
## 2013-11-28 NOTE — Addendum Note (Signed)
Addended by: Sadie Haber D on: 11/28/2013 01:06 PM   Modules accepted: Orders

## 2013-12-06 ENCOUNTER — Other Ambulatory Visit: Payer: Self-pay | Admitting: Internal Medicine

## 2013-12-07 ENCOUNTER — Other Ambulatory Visit: Payer: Self-pay | Admitting: Internal Medicine

## 2013-12-09 ENCOUNTER — Telehealth: Payer: Self-pay | Admitting: Internal Medicine

## 2013-12-09 NOTE — Telephone Encounter (Signed)
Pt is requesting refill of HYDROcodone-acetaminophen (NORCO) 10-325 MG per tablet. Please call pt when ready for pick up.

## 2013-12-10 MED ORDER — HYDROCODONE-ACETAMINOPHEN 10-325 MG PO TABS: ORAL_TABLET | ORAL | Status: DC

## 2013-12-10 NOTE — Telephone Encounter (Signed)
ok 

## 2013-12-10 NOTE — Telephone Encounter (Signed)
rx up front for p/u, pt aware 

## 2013-12-10 NOTE — Telephone Encounter (Signed)
Could you approve since Dr Swords is out of the office? 

## 2013-12-13 DIAGNOSIS — M545 Low back pain, unspecified: Secondary | ICD-10-CM | POA: Diagnosis not present

## 2013-12-16 ENCOUNTER — Other Ambulatory Visit: Payer: Self-pay | Admitting: Internal Medicine

## 2013-12-19 DIAGNOSIS — M545 Low back pain, unspecified: Secondary | ICD-10-CM | POA: Diagnosis not present

## 2013-12-23 DIAGNOSIS — M545 Low back pain, unspecified: Secondary | ICD-10-CM | POA: Diagnosis not present

## 2013-12-25 DIAGNOSIS — M545 Low back pain, unspecified: Secondary | ICD-10-CM | POA: Diagnosis not present

## 2013-12-26 DIAGNOSIS — M545 Low back pain, unspecified: Secondary | ICD-10-CM | POA: Diagnosis not present

## 2013-12-30 DIAGNOSIS — M545 Low back pain, unspecified: Secondary | ICD-10-CM | POA: Diagnosis not present

## 2014-01-01 DIAGNOSIS — M545 Low back pain, unspecified: Secondary | ICD-10-CM | POA: Diagnosis not present

## 2014-01-06 DIAGNOSIS — M545 Low back pain, unspecified: Secondary | ICD-10-CM | POA: Diagnosis not present

## 2014-01-08 DIAGNOSIS — M545 Low back pain, unspecified: Secondary | ICD-10-CM | POA: Diagnosis not present

## 2014-01-09 ENCOUNTER — Ambulatory Visit: Payer: Medicare Other | Admitting: Neurology

## 2014-01-09 DIAGNOSIS — E538 Deficiency of other specified B group vitamins: Secondary | ICD-10-CM

## 2014-01-09 MED ORDER — CYANOCOBALAMIN 1000 MCG/ML IJ SOLN
1000.0000 ug | Freq: Once | INTRAMUSCULAR | Status: AC
  Administered 2014-01-09: 1000 ug via INTRAMUSCULAR

## 2014-01-09 NOTE — Progress Notes (Unsigned)
Patient in for b-12 injection 

## 2014-01-13 DIAGNOSIS — M545 Low back pain, unspecified: Secondary | ICD-10-CM | POA: Diagnosis not present

## 2014-01-20 ENCOUNTER — Telehealth: Payer: Self-pay | Admitting: Internal Medicine

## 2014-01-20 MED ORDER — HYDROCODONE-ACETAMINOPHEN 10-325 MG PO TABS: ORAL_TABLET | ORAL | Status: DC

## 2014-01-20 NOTE — Telephone Encounter (Signed)
Pt's wife calling on behalf of patient to request refill of HYDROcodone-acetaminophen (NORCO) 10-325 MG per tablet.

## 2014-01-21 NOTE — Telephone Encounter (Signed)
rx up front for p/u, pt aware

## 2014-01-21 NOTE — Telephone Encounter (Signed)
Ok per Dr Jenkins 

## 2014-02-01 ENCOUNTER — Other Ambulatory Visit: Payer: Self-pay | Admitting: Internal Medicine

## 2014-02-03 ENCOUNTER — Telehealth: Payer: Self-pay | Admitting: Neurology

## 2014-02-03 ENCOUNTER — Telehealth: Payer: Self-pay | Admitting: Internal Medicine

## 2014-02-03 DIAGNOSIS — H35039 Hypertensive retinopathy, unspecified eye: Secondary | ICD-10-CM | POA: Diagnosis not present

## 2014-02-03 DIAGNOSIS — G20 Parkinson's disease: Secondary | ICD-10-CM

## 2014-02-03 DIAGNOSIS — I10 Essential (primary) hypertension: Secondary | ICD-10-CM | POA: Diagnosis not present

## 2014-02-03 MED ORDER — METHOCARBAMOL 500 MG PO TABS: 500.0000 mg | ORAL_TABLET | Freq: Four times a day (QID) | ORAL | Status: DC

## 2014-02-03 NOTE — Telephone Encounter (Signed)
Pt wife calling requesting rx methocarbamol 500 mg, sent [facility] on [address].

## 2014-02-03 NOTE — Telephone Encounter (Signed)
rx sent in electronically 

## 2014-02-03 NOTE — Telephone Encounter (Signed)
Per Dr Doristine Devoid last office note, she had recommended [facility] for physical therapy and patient declined. Since he is now ready a referral has been placed to Neuro Re[facility] at [facility] and they will contact patient with appt. Patient's wife made aware.

## 2014-02-03 NOTE — Telephone Encounter (Signed)
ok 

## 2014-02-03 NOTE — Telephone Encounter (Signed)
No on med list please advise

## 2014-02-03 NOTE — Telephone Encounter (Signed)
Pt's wife called stating that pt would like to be referred to [facility] rehab for his parkinsons.

## 2014-02-12 ENCOUNTER — Telehealth: Payer: Self-pay | Admitting: Neurology

## 2014-02-12 DIAGNOSIS — G20 Parkinson's disease: Secondary | ICD-10-CM

## 2014-02-12 NOTE — Telephone Encounter (Signed)
Left message on machine for patient to call back. If they call back about not hearing from [facility] Re[facility] please make them aware that it sometimes takes a little while to hear from them. Referral sent 02/03/2014. They can call [phone] if they do not hear from them to schedule appt.

## 2014-02-12 NOTE — Telephone Encounter (Signed)
Pt wife called and would like to talk to you about a program that they are waiting to hear from please call [phone]

## 2014-02-20 ENCOUNTER — Ambulatory Visit: Payer: Medicare Other | Attending: Neurology

## 2014-02-20 DIAGNOSIS — R293 Abnormal posture: Secondary | ICD-10-CM | POA: Diagnosis not present

## 2014-02-20 DIAGNOSIS — R269 Unspecified abnormalities of gait and mobility: Secondary | ICD-10-CM | POA: Diagnosis not present

## 2014-02-20 DIAGNOSIS — R5381 Other malaise: Secondary | ICD-10-CM | POA: Diagnosis not present

## 2014-02-20 DIAGNOSIS — M6281 Muscle weakness (generalized): Secondary | ICD-10-CM | POA: Diagnosis not present

## 2014-02-20 DIAGNOSIS — G20A1 Parkinson's disease without dyskinesia, without mention of fluctuations: Secondary | ICD-10-CM | POA: Insufficient documentation

## 2014-02-20 DIAGNOSIS — R279 Unspecified lack of coordination: Secondary | ICD-10-CM | POA: Diagnosis not present

## 2014-02-20 DIAGNOSIS — R471 Dysarthria and anarthria: Secondary | ICD-10-CM | POA: Diagnosis not present

## 2014-02-20 DIAGNOSIS — G20 Parkinson's disease: Secondary | ICD-10-CM | POA: Diagnosis not present

## 2014-02-20 DIAGNOSIS — Z5189 Encounter for other specified aftercare: Secondary | ICD-10-CM | POA: Diagnosis not present

## 2014-02-26 ENCOUNTER — Telehealth: Payer: Self-pay | Admitting: Internal Medicine

## 2014-02-26 ENCOUNTER — Ambulatory Visit: Payer: Medicare Other | Admitting: Occupational Therapy

## 2014-02-26 DIAGNOSIS — M6281 Muscle weakness (generalized): Secondary | ICD-10-CM | POA: Diagnosis not present

## 2014-02-26 DIAGNOSIS — Z5189 Encounter for other specified aftercare: Secondary | ICD-10-CM | POA: Diagnosis not present

## 2014-02-26 DIAGNOSIS — R269 Unspecified abnormalities of gait and mobility: Secondary | ICD-10-CM | POA: Diagnosis not present

## 2014-02-26 DIAGNOSIS — R279 Unspecified lack of coordination: Secondary | ICD-10-CM | POA: Diagnosis not present

## 2014-02-26 DIAGNOSIS — R5381 Other malaise: Secondary | ICD-10-CM | POA: Diagnosis not present

## 2014-02-26 DIAGNOSIS — G20 Parkinson's disease: Secondary | ICD-10-CM | POA: Diagnosis not present

## 2014-02-26 MED ORDER — HYDROCODONE-ACETAMINOPHEN 10-325 MG PO TABS: ORAL_TABLET | ORAL | Status: DC

## 2014-02-26 NOTE — Telephone Encounter (Signed)
Ok per Dr Leanne Chang, rx up front for p/u, pt aware

## 2014-02-26 NOTE — Telephone Encounter (Signed)
Pt request rx HYDROcodone-acetaminophen (NORCO) 10-325 MG per tablet ° °

## 2014-02-27 ENCOUNTER — Ambulatory Visit (INDEPENDENT_AMBULATORY_CARE_PROVIDER_SITE_OTHER): Payer: Medicare Other | Admitting: Neurology

## 2014-02-27 ENCOUNTER — Encounter: Payer: Self-pay | Admitting: Neurology

## 2014-02-27 VITALS — BP 120/62 | HR 74 | Resp 18 | Ht 67.0 in | Wt 244.3 lb

## 2014-02-27 DIAGNOSIS — G4710 Hypersomnia, unspecified: Secondary | ICD-10-CM | POA: Diagnosis not present

## 2014-02-27 DIAGNOSIS — E538 Deficiency of other specified B group vitamins: Secondary | ICD-10-CM | POA: Diagnosis not present

## 2014-02-27 DIAGNOSIS — G20 Parkinson's disease: Secondary | ICD-10-CM

## 2014-02-27 DIAGNOSIS — G4719 Other hypersomnia: Secondary | ICD-10-CM

## 2014-02-27 MED ORDER — CYANOCOBALAMIN 1000 MCG/ML IJ SOLN
1000.0000 ug | Freq: Once | INTRAMUSCULAR | Status: AC
  Administered 2014-02-27: 1000 ug via INTRAMUSCULAR

## 2014-02-27 MED ORDER — CARBIDOPA-LEVODOPA ER 50-200 MG PO TBCR: 1.0000 | EXTENDED_RELEASE_TABLET | Freq: Every day | ORAL | Status: DC
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
## 2014-03-03 ENCOUNTER — Ambulatory Visit: Payer: Medicare Other | Admitting: Physical Therapy

## 2014-03-03 DIAGNOSIS — R279 Unspecified lack of coordination: Secondary | ICD-10-CM | POA: Diagnosis not present

## 2014-03-03 DIAGNOSIS — R5381 Other malaise: Secondary | ICD-10-CM | POA: Diagnosis not present

## 2014-03-03 DIAGNOSIS — R269 Unspecified abnormalities of gait and mobility: Secondary | ICD-10-CM | POA: Diagnosis not present

## 2014-03-03 DIAGNOSIS — G20 Parkinson's disease: Secondary | ICD-10-CM | POA: Diagnosis not present

## 2014-03-03 DIAGNOSIS — M6281 Muscle weakness (generalized): Secondary | ICD-10-CM | POA: Diagnosis not present

## 2014-03-03 DIAGNOSIS — Z5189 Encounter for other specified aftercare: Secondary | ICD-10-CM | POA: Diagnosis not present

## 2014-03-05 ENCOUNTER — Ambulatory Visit: Payer: Medicare Other

## 2014-03-05 DIAGNOSIS — Z5189 Encounter for other specified aftercare: Secondary | ICD-10-CM | POA: Diagnosis not present

## 2014-03-05 DIAGNOSIS — M6281 Muscle weakness (generalized): Secondary | ICD-10-CM | POA: Diagnosis not present

## 2014-03-05 DIAGNOSIS — G20 Parkinson's disease: Secondary | ICD-10-CM | POA: Diagnosis not present

## 2014-03-05 DIAGNOSIS — R269 Unspecified abnormalities of gait and mobility: Secondary | ICD-10-CM | POA: Diagnosis not present

## 2014-03-05 DIAGNOSIS — R5381 Other malaise: Secondary | ICD-10-CM | POA: Diagnosis not present

## 2014-03-05 DIAGNOSIS — R279 Unspecified lack of coordination: Secondary | ICD-10-CM | POA: Diagnosis not present

## 2014-03-12 ENCOUNTER — Ambulatory Visit: Payer: Medicare Other | Admitting: Physical Therapy

## 2014-03-17 ENCOUNTER — Other Ambulatory Visit: Payer: Self-pay | Admitting: Internal Medicine

## 2014-03-17 ENCOUNTER — Ambulatory Visit: Payer: Medicare Other | Attending: Neurology | Admitting: Physical Therapy

## 2014-03-17 DIAGNOSIS — R5381 Other malaise: Secondary | ICD-10-CM | POA: Diagnosis not present

## 2014-03-17 DIAGNOSIS — R279 Unspecified lack of coordination: Secondary | ICD-10-CM | POA: Diagnosis not present

## 2014-03-17 DIAGNOSIS — G20A1 Parkinson's disease without dyskinesia, without mention of fluctuations: Secondary | ICD-10-CM | POA: Diagnosis not present

## 2014-03-17 DIAGNOSIS — Z5189 Encounter for other specified aftercare: Secondary | ICD-10-CM | POA: Diagnosis present

## 2014-03-17 DIAGNOSIS — M6281 Muscle weakness (generalized): Secondary | ICD-10-CM | POA: Diagnosis not present

## 2014-03-17 DIAGNOSIS — R269 Unspecified abnormalities of gait and mobility: Secondary | ICD-10-CM | POA: Insufficient documentation

## 2014-03-17 DIAGNOSIS — R293 Abnormal posture: Secondary | ICD-10-CM | POA: Insufficient documentation

## 2014-03-17 DIAGNOSIS — G20 Parkinson's disease: Secondary | ICD-10-CM | POA: Insufficient documentation

## 2014-03-19 ENCOUNTER — Ambulatory Visit: Payer: Medicare Other | Admitting: Occupational Therapy

## 2014-03-19 ENCOUNTER — Ambulatory Visit: Payer: Medicare Other | Admitting: Physical Therapy

## 2014-03-19 DIAGNOSIS — R279 Unspecified lack of coordination: Secondary | ICD-10-CM | POA: Diagnosis not present

## 2014-03-24 ENCOUNTER — Ambulatory Visit: Payer: Medicare Other | Admitting: Physical Therapy

## 2014-03-24 DIAGNOSIS — R279 Unspecified lack of coordination: Secondary | ICD-10-CM | POA: Diagnosis not present

## 2014-03-25 ENCOUNTER — Telehealth: Payer: Self-pay | Admitting: Internal Medicine

## 2014-03-25 NOTE — Telephone Encounter (Addendum)
Pt would like to know if you will accept him and his wife, Walter Gill.

## 2014-03-25 NOTE — Telephone Encounter (Signed)
Pt request refill HYDROcodone-acetaminophen (NORCO) 10-325 MG per tablet °

## 2014-03-26 ENCOUNTER — Ambulatory Visit: Payer: Medicare Other

## 2014-03-26 ENCOUNTER — Ambulatory Visit: Payer: Medicare Other | Admitting: Occupational Therapy

## 2014-03-26 DIAGNOSIS — L821 Other seborrheic keratosis: Secondary | ICD-10-CM | POA: Diagnosis not present

## 2014-03-26 DIAGNOSIS — R279 Unspecified lack of coordination: Secondary | ICD-10-CM | POA: Diagnosis not present

## 2014-03-26 DIAGNOSIS — D1801 Hemangioma of skin and subcutaneous tissue: Secondary | ICD-10-CM | POA: Diagnosis not present

## 2014-03-26 NOTE — Telephone Encounter (Signed)
My advice is that we try to see if they are willing to see providers who are taking new patients.

## 2014-03-27 ENCOUNTER — Other Ambulatory Visit: Payer: Self-pay | Admitting: Neurology

## 2014-03-27 ENCOUNTER — Telehealth: Payer: Self-pay | Admitting: Neurology

## 2014-03-27 ENCOUNTER — Ambulatory Visit (INDEPENDENT_AMBULATORY_CARE_PROVIDER_SITE_OTHER): Payer: Medicare Other | Admitting: *Deleted

## 2014-03-27 ENCOUNTER — Ambulatory Visit: Payer: Medicare Other | Admitting: Physical Therapy

## 2014-03-27 DIAGNOSIS — R279 Unspecified lack of coordination: Secondary | ICD-10-CM | POA: Diagnosis not present

## 2014-03-27 DIAGNOSIS — E538 Deficiency of other specified B group vitamins: Secondary | ICD-10-CM | POA: Diagnosis not present

## 2014-03-27 DIAGNOSIS — M25561 Pain in right knee: Secondary | ICD-10-CM

## 2014-03-27 MED ORDER — CYANOCOBALAMIN 1000 MCG/ML IJ SOLN
1000.0000 ug | Freq: Once | INTRAMUSCULAR | Status: AC
  Administered 2014-03-27: 1000 ug via INTRAMUSCULAR

## 2014-03-27 NOTE — Progress Notes (Signed)
Pt in for B12 injection. 

## 2014-03-27 NOTE — Telephone Encounter (Signed)
LMOM for patient to call back. To make him aware I have made a referral to Dr Wynelle Link for his right knee pain. He is booked until the end of June so appt made with his PA Amber on 04/10/14 at 9:00 am. Awaiting call back to make him aware.

## 2014-03-28 ENCOUNTER — Ambulatory Visit: Payer: Medicare Other | Admitting: Occupational Therapy

## 2014-03-28 DIAGNOSIS — R279 Unspecified lack of coordination: Secondary | ICD-10-CM | POA: Diagnosis not present

## 2014-03-28 MED ORDER — HYDROCODONE-ACETAMINOPHEN 10-325 MG PO TABS
ORAL_TABLET | ORAL | Status: DC
Start: 2014-03-28 — End: 2014-04-30

## 2014-03-28 NOTE — Telephone Encounter (Signed)
Patient's wife made aware of appt.  

## 2014-03-28 NOTE — Telephone Encounter (Signed)
Ok per Dr Leanne Chang, rx ready for p/u on Monday 03/31/14, pt aware

## 2014-03-31 ENCOUNTER — Ambulatory Visit: Payer: Medicare Other | Admitting: Physical Therapy

## 2014-03-31 DIAGNOSIS — R279 Unspecified lack of coordination: Secondary | ICD-10-CM | POA: Diagnosis not present

## 2014-04-03 ENCOUNTER — Ambulatory Visit: Payer: Medicare Other | Admitting: Physical Therapy

## 2014-04-03 ENCOUNTER — Ambulatory Visit: Payer: Medicare Other | Admitting: Occupational Therapy

## 2014-04-03 DIAGNOSIS — R279 Unspecified lack of coordination: Secondary | ICD-10-CM | POA: Diagnosis not present

## 2014-04-04 ENCOUNTER — Ambulatory Visit: Payer: Medicare Other | Admitting: Occupational Therapy

## 2014-04-04 DIAGNOSIS — R279 Unspecified lack of coordination: Secondary | ICD-10-CM | POA: Diagnosis not present

## 2014-04-05 ENCOUNTER — Other Ambulatory Visit: Payer: Self-pay | Admitting: Internal Medicine

## 2014-04-09 ENCOUNTER — Ambulatory Visit: Payer: Medicare Other | Admitting: Physical Therapy

## 2014-04-09 ENCOUNTER — Ambulatory Visit: Payer: Medicare Other | Admitting: Occupational Therapy

## 2014-04-09 DIAGNOSIS — R279 Unspecified lack of coordination: Secondary | ICD-10-CM | POA: Diagnosis not present

## 2014-04-10 DIAGNOSIS — M1710 Unilateral primary osteoarthritis, unspecified knee: Secondary | ICD-10-CM | POA: Diagnosis not present

## 2014-04-10 NOTE — Telephone Encounter (Signed)
Patient has the CPAP machine, but needs a chin strap because his mouth hangs open. He needs a prescription authorizing for him to get one from [facility]. His contact number has been verified. Thanks!

## 2014-04-11 ENCOUNTER — Ambulatory Visit: Payer: Medicare Other | Admitting: Occupational Therapy

## 2014-04-11 ENCOUNTER — Ambulatory Visit: Payer: Medicare Other | Attending: Neurology | Admitting: Physical Therapy

## 2014-04-11 DIAGNOSIS — M6281 Muscle weakness (generalized): Secondary | ICD-10-CM | POA: Diagnosis not present

## 2014-04-11 DIAGNOSIS — R293 Abnormal posture: Secondary | ICD-10-CM | POA: Diagnosis not present

## 2014-04-11 DIAGNOSIS — G20 Parkinson's disease: Secondary | ICD-10-CM | POA: Insufficient documentation

## 2014-04-11 DIAGNOSIS — R5381 Other malaise: Secondary | ICD-10-CM | POA: Insufficient documentation

## 2014-04-11 DIAGNOSIS — R269 Unspecified abnormalities of gait and mobility: Secondary | ICD-10-CM | POA: Diagnosis not present

## 2014-04-11 DIAGNOSIS — R279 Unspecified lack of coordination: Secondary | ICD-10-CM | POA: Diagnosis not present

## 2014-04-11 DIAGNOSIS — G20A1 Parkinson's disease without dyskinesia, without mention of fluctuations: Secondary | ICD-10-CM | POA: Insufficient documentation

## 2014-04-14 ENCOUNTER — Ambulatory Visit: Payer: Medicare Other | Admitting: Physical Therapy

## 2014-04-15 NOTE — Telephone Encounter (Signed)
Left message for pt to call back  °

## 2014-04-16 ENCOUNTER — Ambulatory Visit: Payer: Medicare Other | Admitting: Occupational Therapy

## 2014-04-16 ENCOUNTER — Ambulatory Visit: Payer: Medicare Other | Admitting: Physical Therapy

## 2014-04-18 NOTE — Telephone Encounter (Signed)
Order faxed.

## 2014-04-21 ENCOUNTER — Ambulatory Visit: Payer: Medicare Other | Admitting: Physical Therapy

## 2014-04-22 ENCOUNTER — Telehealth: Payer: Self-pay | Admitting: Neurology

## 2014-04-22 NOTE — Telephone Encounter (Signed)
Cancelled upcoming b12 injection d/t scheduling conflict. Will call back later to r/s / Sherri S.

## 2014-04-24 ENCOUNTER — Ambulatory Visit: Payer: Medicare Other | Admitting: Physical Therapy

## 2014-04-24 ENCOUNTER — Encounter: Payer: Medicare Other | Admitting: Occupational Therapy

## 2014-04-25 ENCOUNTER — Ambulatory Visit: Payer: Medicare Other

## 2014-04-28 ENCOUNTER — Encounter: Payer: Medicare Other | Admitting: Occupational Therapy

## 2014-04-28 ENCOUNTER — Ambulatory Visit: Payer: Medicare Other | Admitting: Physical Therapy

## 2014-04-29 ENCOUNTER — Telehealth: Payer: Self-pay | Admitting: Internal Medicine

## 2014-04-29 NOTE — Telephone Encounter (Signed)
Pt req rx HYDROcodone-acetaminophen (NORCO) 10-325 MG per tablet ° °

## 2014-04-30 ENCOUNTER — Ambulatory Visit: Payer: Medicare Other | Admitting: Physical Therapy

## 2014-04-30 MED ORDER — HYDROCODONE-ACETAMINOPHEN 10-325 MG PO TABS: ORAL_TABLET | ORAL | Status: DC

## 2014-04-30 NOTE — Telephone Encounter (Signed)
Ok per Dr Leanne Chang, rx up front for p/u, pt aware

## 2014-05-01 ENCOUNTER — Ambulatory Visit: Payer: Medicare Other | Admitting: Physical Therapy

## 2014-05-01 ENCOUNTER — Encounter: Payer: Medicare Other | Admitting: Occupational Therapy

## 2014-05-02 ENCOUNTER — Ambulatory Visit: Payer: Medicare Other | Admitting: Occupational Therapy

## 2014-05-13 ENCOUNTER — Telehealth: Payer: Self-pay | Admitting: Internal Medicine

## 2014-05-13 NOTE — Telephone Encounter (Signed)
Order refaxed

## 2014-05-13 NOTE — Telephone Encounter (Signed)
Pt need to have chin strap request refaxed  To [facility]

## 2014-05-14 ENCOUNTER — Ambulatory Visit: Payer: Medicare Other | Admitting: Physical Therapy

## 2014-05-14 ENCOUNTER — Ambulatory Visit: Payer: Medicare Other | Attending: Neurology | Admitting: Occupational Therapy

## 2014-05-14 DIAGNOSIS — Z5189 Encounter for other specified aftercare: Secondary | ICD-10-CM | POA: Diagnosis present

## 2014-05-14 DIAGNOSIS — R279 Unspecified lack of coordination: Secondary | ICD-10-CM | POA: Diagnosis not present

## 2014-05-14 DIAGNOSIS — G20 Parkinson's disease: Secondary | ICD-10-CM | POA: Diagnosis not present

## 2014-05-14 DIAGNOSIS — M6281 Muscle weakness (generalized): Secondary | ICD-10-CM | POA: Diagnosis not present

## 2014-05-14 DIAGNOSIS — R5381 Other malaise: Secondary | ICD-10-CM | POA: Insufficient documentation

## 2014-05-14 DIAGNOSIS — G20A1 Parkinson's disease without dyskinesia, without mention of fluctuations: Secondary | ICD-10-CM | POA: Insufficient documentation

## 2014-05-14 DIAGNOSIS — R269 Unspecified abnormalities of gait and mobility: Secondary | ICD-10-CM | POA: Insufficient documentation

## 2014-05-14 DIAGNOSIS — R293 Abnormal posture: Secondary | ICD-10-CM | POA: Insufficient documentation

## 2014-05-16 ENCOUNTER — Ambulatory Visit: Payer: Medicare Other | Admitting: Physical Therapy

## 2014-05-16 ENCOUNTER — Ambulatory Visit: Payer: Medicare Other | Admitting: Occupational Therapy

## 2014-05-16 DIAGNOSIS — R279 Unspecified lack of coordination: Secondary | ICD-10-CM | POA: Diagnosis not present

## 2014-05-16 DIAGNOSIS — G20 Parkinson's disease: Secondary | ICD-10-CM | POA: Diagnosis not present

## 2014-05-16 DIAGNOSIS — R5381 Other malaise: Secondary | ICD-10-CM | POA: Diagnosis not present

## 2014-05-16 DIAGNOSIS — M6281 Muscle weakness (generalized): Secondary | ICD-10-CM | POA: Diagnosis not present

## 2014-05-16 DIAGNOSIS — R293 Abnormal posture: Secondary | ICD-10-CM | POA: Diagnosis not present

## 2014-05-16 DIAGNOSIS — R269 Unspecified abnormalities of gait and mobility: Secondary | ICD-10-CM | POA: Diagnosis not present

## 2014-05-19 ENCOUNTER — Ambulatory Visit: Payer: Medicare Other | Admitting: Physical Therapy

## 2014-05-19 ENCOUNTER — Ambulatory Visit: Payer: Medicare Other | Admitting: Occupational Therapy

## 2014-05-19 DIAGNOSIS — R279 Unspecified lack of coordination: Secondary | ICD-10-CM | POA: Diagnosis not present

## 2014-05-19 DIAGNOSIS — R269 Unspecified abnormalities of gait and mobility: Secondary | ICD-10-CM | POA: Diagnosis not present

## 2014-05-19 DIAGNOSIS — R293 Abnormal posture: Secondary | ICD-10-CM | POA: Diagnosis not present

## 2014-05-19 DIAGNOSIS — G20 Parkinson's disease: Secondary | ICD-10-CM | POA: Diagnosis not present

## 2014-05-19 DIAGNOSIS — R5381 Other malaise: Secondary | ICD-10-CM | POA: Diagnosis not present

## 2014-05-19 DIAGNOSIS — M6281 Muscle weakness (generalized): Secondary | ICD-10-CM | POA: Diagnosis not present

## 2014-05-21 ENCOUNTER — Ambulatory Visit: Payer: Medicare Other | Admitting: Occupational Therapy

## 2014-05-21 ENCOUNTER — Ambulatory Visit: Payer: Medicare Other | Admitting: Physical Therapy

## 2014-05-21 DIAGNOSIS — R293 Abnormal posture: Secondary | ICD-10-CM | POA: Diagnosis not present

## 2014-05-21 DIAGNOSIS — R269 Unspecified abnormalities of gait and mobility: Secondary | ICD-10-CM | POA: Diagnosis not present

## 2014-05-21 DIAGNOSIS — R279 Unspecified lack of coordination: Secondary | ICD-10-CM | POA: Diagnosis not present

## 2014-05-21 DIAGNOSIS — G20 Parkinson's disease: Secondary | ICD-10-CM | POA: Diagnosis not present

## 2014-05-21 DIAGNOSIS — R5381 Other malaise: Secondary | ICD-10-CM | POA: Diagnosis not present

## 2014-05-21 DIAGNOSIS — M6281 Muscle weakness (generalized): Secondary | ICD-10-CM | POA: Diagnosis not present

## 2014-05-26 ENCOUNTER — Encounter: Payer: Self-pay | Admitting: Neurology

## 2014-05-26 ENCOUNTER — Ambulatory Visit (INDEPENDENT_AMBULATORY_CARE_PROVIDER_SITE_OTHER): Payer: Medicare Other | Admitting: Neurology

## 2014-05-26 VITALS — BP 130/72 | HR 78 | Resp 18 | Ht 69.0 in | Wt 239.0 lb

## 2014-05-26 DIAGNOSIS — G20 Parkinson's disease: Secondary | ICD-10-CM | POA: Diagnosis not present

## 2014-05-26 DIAGNOSIS — Z9989 Dependence on other enabling machines and devices: Secondary | ICD-10-CM

## 2014-05-26 DIAGNOSIS — G4710 Hypersomnia, unspecified: Secondary | ICD-10-CM

## 2014-05-26 DIAGNOSIS — G4733 Obstructive sleep apnea (adult) (pediatric): Secondary | ICD-10-CM | POA: Diagnosis not present

## 2014-05-26 DIAGNOSIS — G20A1 Parkinson's disease without dyskinesia, without mention of fluctuations: Secondary | ICD-10-CM

## 2014-05-26 DIAGNOSIS — E538 Deficiency of other specified B group vitamins: Secondary | ICD-10-CM

## 2014-05-26 DIAGNOSIS — G4719 Other hypersomnia: Secondary | ICD-10-CM

## 2014-05-26 MED ORDER — CYANOCOBALAMIN 1000 MCG/ML IJ SOLN
1000.0000 ug | Freq: Once | INTRAMUSCULAR | Status: AC
  Administered 2014-05-26: 1000 ug via INTRAMUSCULAR

## 2014-05-26 NOTE — Patient Instructions (Signed)
1. Please schedule monthly B12 injections.  2. We have set you up for a split night sleep study on Monday July 07, 2014 at 8:00 pm at the [facility]. They will mail you a packet of information about the study. If you need to reschedule for any reason you can call [phone]. 3. Follow up in 4 months.
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
## 2014-05-27 ENCOUNTER — Ambulatory Visit: Payer: Medicare Other | Admitting: Occupational Therapy

## 2014-05-27 ENCOUNTER — Ambulatory Visit: Payer: Medicare Other | Admitting: Physical Therapy

## 2014-05-27 DIAGNOSIS — R279 Unspecified lack of coordination: Secondary | ICD-10-CM | POA: Diagnosis not present

## 2014-05-27 DIAGNOSIS — R269 Unspecified abnormalities of gait and mobility: Secondary | ICD-10-CM | POA: Diagnosis not present

## 2014-05-27 DIAGNOSIS — M1710 Unilateral primary osteoarthritis, unspecified knee: Secondary | ICD-10-CM | POA: Diagnosis not present

## 2014-05-27 DIAGNOSIS — G20 Parkinson's disease: Secondary | ICD-10-CM | POA: Diagnosis not present

## 2014-05-27 DIAGNOSIS — R5381 Other malaise: Secondary | ICD-10-CM | POA: Diagnosis not present

## 2014-05-27 DIAGNOSIS — M6281 Muscle weakness (generalized): Secondary | ICD-10-CM | POA: Diagnosis not present

## 2014-05-27 DIAGNOSIS — R293 Abnormal posture: Secondary | ICD-10-CM | POA: Diagnosis not present

## 2014-05-29 ENCOUNTER — Ambulatory Visit: Payer: Medicare Other | Admitting: Physical Therapy

## 2014-05-29 ENCOUNTER — Ambulatory Visit: Payer: Medicare Other | Admitting: Occupational Therapy

## 2014-05-29 DIAGNOSIS — R279 Unspecified lack of coordination: Secondary | ICD-10-CM | POA: Diagnosis not present

## 2014-05-29 DIAGNOSIS — R293 Abnormal posture: Secondary | ICD-10-CM | POA: Diagnosis not present

## 2014-05-29 DIAGNOSIS — R5381 Other malaise: Secondary | ICD-10-CM | POA: Diagnosis not present

## 2014-05-29 DIAGNOSIS — G20 Parkinson's disease: Secondary | ICD-10-CM | POA: Diagnosis not present

## 2014-05-29 DIAGNOSIS — R269 Unspecified abnormalities of gait and mobility: Secondary | ICD-10-CM | POA: Diagnosis not present

## 2014-05-29 DIAGNOSIS — M6281 Muscle weakness (generalized): Secondary | ICD-10-CM | POA: Diagnosis not present

## 2014-06-02 ENCOUNTER — Telehealth: Payer: Self-pay | Admitting: Internal Medicine

## 2014-06-02 MED ORDER — HYDROCODONE-ACETAMINOPHEN 10-325 MG PO TABS: ORAL_TABLET | ORAL | Status: DC

## 2014-06-02 NOTE — Telephone Encounter (Signed)
rx ready for p/u on Tuesday 06/03/14, pt aware

## 2014-06-02 NOTE — Telephone Encounter (Signed)
Pt req rx on HYDROcodone-acetaminophen (NORCO) 10-325 MG per tablet

## 2014-06-03 ENCOUNTER — Other Ambulatory Visit: Payer: Self-pay | Admitting: Internal Medicine

## 2014-06-24 DIAGNOSIS — I10 Essential (primary) hypertension: Secondary | ICD-10-CM | POA: Diagnosis not present

## 2014-06-24 DIAGNOSIS — H35039 Hypertensive retinopathy, unspecified eye: Secondary | ICD-10-CM | POA: Diagnosis not present

## 2014-06-25 ENCOUNTER — Ambulatory Visit (INDEPENDENT_AMBULATORY_CARE_PROVIDER_SITE_OTHER): Payer: Medicare Other | Admitting: Neurology

## 2014-06-25 DIAGNOSIS — E538 Deficiency of other specified B group vitamins: Secondary | ICD-10-CM | POA: Diagnosis not present

## 2014-06-25 DIAGNOSIS — M1710 Unilateral primary osteoarthritis, unspecified knee: Secondary | ICD-10-CM | POA: Diagnosis not present

## 2014-06-26 DIAGNOSIS — E538 Deficiency of other specified B group vitamins: Secondary | ICD-10-CM | POA: Diagnosis not present

## 2014-06-26 MED ORDER — CYANOCOBALAMIN 1000 MCG/ML IJ SOLN
1000.0000 ug | Freq: Once | INTRAMUSCULAR | Status: AC
  Administered 2014-06-26: 1000 ug via INTRAMUSCULAR

## 2014-06-26 NOTE — Progress Notes (Signed)
Patient in for b-12 injection with no reaction patient to return in one month

## 2014-06-27 NOTE — Progress Notes (Signed)
Here for nursing B12 injection

## 2014-07-02 ENCOUNTER — Telehealth: Payer: Self-pay | Admitting: Internal Medicine

## 2014-07-02 DIAGNOSIS — M1710 Unilateral primary osteoarthritis, unspecified knee: Secondary | ICD-10-CM | POA: Diagnosis not present

## 2014-07-02 NOTE — Telephone Encounter (Signed)
Pt req rx on HYDROcodone-acetaminophen (NORCO) 10-325 MG per tablet

## 2014-07-04 MED ORDER — HYDROCODONE-ACETAMINOPHEN 10-325 MG PO TABS: ORAL_TABLET | ORAL | Status: DC

## 2014-07-04 NOTE — Telephone Encounter (Signed)
rx will be ready for pick up on Monday, pt aware

## 2014-07-07 ENCOUNTER — Telehealth: Payer: Self-pay | Admitting: Gastroenterology

## 2014-07-07 ENCOUNTER — Ambulatory Visit (HOSPITAL_BASED_OUTPATIENT_CLINIC_OR_DEPARTMENT_OTHER): Payer: Medicare Other | Attending: Neurology | Admitting: Radiology

## 2014-07-07 VITALS — Ht 67.0 in | Wt 240.0 lb

## 2014-07-07 DIAGNOSIS — R0989 Other specified symptoms and signs involving the circulatory and respiratory systems: Secondary | ICD-10-CM | POA: Insufficient documentation

## 2014-07-07 DIAGNOSIS — R0609 Other forms of dyspnea: Secondary | ICD-10-CM | POA: Insufficient documentation

## 2014-07-07 DIAGNOSIS — Z9989 Dependence on other enabling machines and devices: Secondary | ICD-10-CM

## 2014-07-07 DIAGNOSIS — G4761 Periodic limb movement disorder: Secondary | ICD-10-CM | POA: Diagnosis not present

## 2014-07-07 DIAGNOSIS — G4719 Other hypersomnia: Secondary | ICD-10-CM

## 2014-07-07 DIAGNOSIS — G4730 Sleep apnea, unspecified: Secondary | ICD-10-CM | POA: Diagnosis present

## 2014-07-07 DIAGNOSIS — G4733 Obstructive sleep apnea (adult) (pediatric): Secondary | ICD-10-CM | POA: Diagnosis not present

## 2014-07-07 DIAGNOSIS — G4710 Hypersomnia, unspecified: Secondary | ICD-10-CM | POA: Diagnosis present

## 2014-07-07 NOTE — Telephone Encounter (Signed)
Attempted to call pt but received a message stating that the [employer] customer is not available at this time.

## 2014-07-08 NOTE — Telephone Encounter (Signed)
Discussed with pts wife that he can take 3-4 doses of miralax to have BM. Wife verbalized understanding and states she will call back if they continue to have problems.

## 2014-07-09 DIAGNOSIS — M1710 Unilateral primary osteoarthritis, unspecified knee: Secondary | ICD-10-CM | POA: Diagnosis not present

## 2014-07-16 ENCOUNTER — Telehealth: Payer: Self-pay | Admitting: Gastroenterology

## 2014-07-16 NOTE — Telephone Encounter (Signed)
Stop fiber - may actually be making worse  Take 4 dulcolax by mouth  Wait 1 hour Drink 6 doses of MiraLax in 2-3 hours  Call us back tomorrow with an update

## 2014-07-16 NOTE — Telephone Encounter (Signed)
Spoke with the spouse. The patient has not had a "good bowel movement" in about 10 days. She is not giving him daily Miralax, but did give him the 3 doses on 07/07/14. He was given Mag Citrate on Sunday. She is giving him daily fiber and high fiber cereal. He has a "hard and uncomfortable abdomen, small amounts of liquid stool and lots of gas". Please advise

## 2014-07-16 NOTE — Telephone Encounter (Signed)
Instructions given and confirmed.

## 2014-07-17 DIAGNOSIS — G4710 Hypersomnia, unspecified: Secondary | ICD-10-CM | POA: Diagnosis not present

## 2014-07-17 DIAGNOSIS — G4730 Sleep apnea, unspecified: Secondary | ICD-10-CM

## 2014-07-17 NOTE — Telephone Encounter (Signed)
He needs to be checked for an impaction somehow - here if we have space or if not ED

## 2014-07-17 NOTE — Telephone Encounter (Signed)
Spouse calls. States patient has not had a bowel movement despite purge. No nausea or vomiting.

## 2014-07-17 NOTE — Sleep Study (Signed)
   NAME: Walter Gill DATE OF BIRTH:  Apr 12, 1934 MEDICAL RECORD NUMBER [id number]  LOCATION: [facility]  PHYSICIAN: Kathee Delton  DATE OF STUDY: 07/07/2014  SLEEP STUDY TYPE: Nocturnal Polysomnogram               REFERRING PHYSICIAN: Tat, Rebecca S, DO  INDICATION FOR STUDY: Hypersomnia with sleep apnea  EPWORTH SLEEPINESS SCORE:  17 HEIGHT: 5\' 7"  (170.2 cm)  WEIGHT: 240 lb (108.863 kg)    Body mass index is 37.58 kg/(m^2).  NECK SIZE: 16 in.  MEDICATIONS: Reviewed in the sleep record  SLEEP ARCHITECTURE: The patient had a total sleep time of 322 minutes, with very little slow-wave sleep or REM noted. Sleep onset latency was normal at 17 minutes, and REM did not occur until late in the titration portion of the study.  Sleep efficiency was 83% during the diagnostic portion of the study, an 87% during the titration portion.  RESPIRATORY DATA: The patient underwent a split night protocol where he was found to have 111 obstructive events in the first 143 minutes of sleep. This gave him an AHI of 47 events per hour during the diagnostic portion of the study. The events occurred primarily in the supine position, and there was loud snoring noted throughout. By protocol the patient was fitted with a large Fisher-Paykel ESON nasal mask, and a chin strap was added for oral venting. Despite this, the patient continues to have mouth opening at times, but declined trying a full face mask. CPAP pressure was increased for both snoring and obstructive events, and the patient was found to have an optimal pressure around 12 cm of water.  OXYGEN DATA: There was oxygen desaturation as low as 86% with the patient's obstructive events  CARDIAC DATA: Occasional PVC noted  MOVEMENT/PARASOMNIA: Moderate numbers of periodic limb movements with little sleep disruption noted. There were no abnormal behaviors seen.  IMPRESSION/ RECOMMENDATION:    1) split-night study reveals severe  obstructive sleep apnea, with an AHI of 47 events per hour and oxygen desaturation as low as 86% during the diagnostic portion of the study. The patient was then fitted with a large Fisher-Paykel ESON nasal mask, and a chin strap was added for oral venting. The patient continued to have mouth opening despite this, but declined trying a full face mask. Optimal CPAP pressure appeared to be 12 cm of water. The patient should also be encouraged to work aggressively on weight loss.  2) occasional PVC noted, but no clinically significant arrhythmias were seen    Kathee Delton Diplomate, American Board of Sleep Medicine  ELECTRONICALLY SIGNED ON:  07/17/2014, 2:44 PM [place] PH: [phone]   FX: [phone] [place]

## 2014-07-17 NOTE — Telephone Encounter (Signed)
Spouse notifed

## 2014-07-18 ENCOUNTER — Telehealth: Payer: Self-pay | Admitting: Neurology

## 2014-07-18 MED ORDER — AMBULATORY NON FORMULARY MEDICATION: 1.0000 [IU] | Freq: Every day | Status: DC

## 2014-07-18 NOTE — Telephone Encounter (Signed)
Called patient to see which nursing company they use for their CPAP settings. Patient thinks he is currently set at 28 - but he needs to be at 12. He states he does not know but his wife should and he will have her call me this afternoon with the information.

## 2014-07-18 NOTE — Telephone Encounter (Signed)
Spoke with patient's wife and she states that CPAP was through [facility] #[phone]. I contacted them and they state to fax an order for CPAP at 12 to them at [phone] and they will contact the patient and make the change. Order faxed.

## 2014-07-22 ENCOUNTER — Ambulatory Visit: Payer: Medicare Other

## 2014-07-22 ENCOUNTER — Telehealth: Payer: Self-pay | Admitting: Neurology

## 2014-07-22 NOTE — Telephone Encounter (Signed)
Pt no showed 07/22/14 B12 injection. A no show letter was not sent. Do we need to call and r/s this injectgion. Please advise / Sherri S.

## 2014-07-23 ENCOUNTER — Ambulatory Visit (INDEPENDENT_AMBULATORY_CARE_PROVIDER_SITE_OTHER): Payer: Medicare Other | Admitting: Family Medicine

## 2014-07-23 ENCOUNTER — Encounter: Payer: Self-pay | Admitting: Family Medicine

## 2014-07-23 VITALS — BP 130/74 | HR 74 | Wt 238.0 lb

## 2014-07-23 DIAGNOSIS — R141 Gas pain: Secondary | ICD-10-CM

## 2014-07-23 DIAGNOSIS — R142 Eructation: Secondary | ICD-10-CM

## 2014-07-23 DIAGNOSIS — K5900 Constipation, unspecified: Secondary | ICD-10-CM | POA: Diagnosis not present

## 2014-07-23 DIAGNOSIS — R140 Abdominal distension (gaseous): Secondary | ICD-10-CM

## 2014-07-23 DIAGNOSIS — R143 Flatulence: Secondary | ICD-10-CM

## 2014-07-23 NOTE — Telephone Encounter (Signed)
Walter Gill will you just call him and check on this?

## 2014-07-23 NOTE — Patient Instructions (Signed)
Constipation  Constipation is when a person has fewer than three bowel movements a week, has difficulty having a bowel movement, or has stools that are dry, hard, or larger than normal. As people grow older, constipation is more common. If you try to fix constipation with medicines that make you have a bowel movement (laxatives), the problem may get worse. Long-term laxative use may cause the muscles of the colon to become weak. A low-fiber diet, not taking in enough fluids, and taking certain medicines may make constipation worse.   CAUSES   · Certain medicines, such as antidepressants, pain medicine, iron supplements, antacids, and water pills.    · Certain diseases, such as diabetes, irritable bowel syndrome (IBS), thyroid disease, or depression.    · Not drinking enough water.    · Not eating enough fiber-rich foods.    · Stress or travel.    · Lack of physical activity or exercise.    · Ignoring the urge to have a bowel movement.    · Using laxatives too much.    SIGNS AND SYMPTOMS   · Having fewer than three bowel movements a week.    · Straining to have a bowel movement.    · Having stools that are hard, dry, or larger than normal.    · Feeling full or bloated.    · Pain in the lower abdomen.    · Not feeling relief after having a bowel movement.    DIAGNOSIS   Your health care provider will take a medical history and perform a physical exam. Further testing may be done for severe constipation. Some tests may include:  · A barium enema X-ray to examine your rectum, colon, and, sometimes, your small intestine.    · A sigmoidoscopy to examine your lower colon.    · A colonoscopy to examine your entire colon.  TREATMENT   Treatment will depend on the severity of your constipation and what is causing it. Some dietary treatments include drinking more fluids and eating more fiber-rich foods. Lifestyle treatments may include regular exercise. If these diet and lifestyle recommendations do not help, your health care  provider may recommend taking over-the-counter laxative medicines to help you have bowel movements. Prescription medicines may be prescribed if over-the-counter medicines do not work.   HOME CARE INSTRUCTIONS   · Eat foods that have a lot of fiber, such as fruits, vegetables, whole grains, and beans.  · Limit foods high in fat and processed sugars, such as french fries, hamburgers, cookies, candies, and soda.    · A fiber supplement may be added to your diet if you cannot get enough fiber from foods.    · Drink enough fluids to keep your urine clear or pale yellow.    · Exercise regularly or as directed by your health care provider.    · Go to the restroom when you have the urge to go. Do not hold it.    · Only take over-the-counter or prescription medicines as directed by your health care provider. Do not take other medicines for constipation without talking to your health care provider first.    SEEK IMMEDIATE MEDICAL CARE IF:   · You have bright red blood in your stool.    · Your constipation lasts for more than 4 days or gets worse.    · You have abdominal or rectal pain.    · You have thin, pencil-like stools.    · You have unexplained weight loss.  MAKE SURE YOU:   · Understand these instructions.  · Will watch your condition.  · Will get help right away if you are not   you have with your health care provider.  Try the regimen with Dulcolax and Miralax as previously instructed. Give [place] feedback by tomorrow.

## 2014-07-23 NOTE — Telephone Encounter (Signed)
Spoke with patient and appt rescheduled for tomorrow.

## 2014-07-23 NOTE — Progress Notes (Signed)
Pre visit review using our clinic review tool, if applicable. No additional management support is needed unless otherwise documented below in the visit note. 

## 2014-07-23 NOTE — Progress Notes (Signed)
   Subjective:    Patient ID: Walter Gill, male    DOB: [birth year], 80 y.o.   MRN: [id number]  Constipation Pertinent negatives include no abdominal pain, diarrhea, fever, nausea or vomiting.   Patient seen with decreased frequency of bowel movements over the past several weeks. He had prior history of impaction. He has some diffuse abdominal bloating but no nausea or vomiting. Over the past 6 days only had one small bowel movement which was this morning. This was low volume bowel movement. Last week he took several doses of Dulcolax and MiraLax and after about 14 hours finally had small bowel movement.  He was previously taking fiber and was instructed to stop this last week for fear that this may be exacerbating his problem. He had TSH which was normal last October. He has several risk factors for constipation including immobility and multiple medications which could exacerbate including chronic opioid use, furosemide,Sanctura, Sinemet.  Past Medical History  Diagnosis Date  . Sleep apnea   . RBBB (right bundle branch block)   . Parkinson disease   . Gout   . Hyperlipidemia   . Hypertension   . OA (osteoarthritis)   . PE (pulmonary embolism) september 2008  . DVT (deep venous thrombosis)   . Left ventricular dysfunction     impaired LV relaxation   Past Surgical History  Procedure Laterality Date  . Appendectomy    . Cholecystectomy    . Dupruytens contracture    . Total hip arthroplasty  9/8/8    right  . Pilonidal cyst removal    . Eye surgery      ? for lid lag vs apraxia of eye opening  . Cataract extraction      reports that he has never smoked. He has never used smokeless tobacco. He reports that he does not drink alcohol or use illicit drugs. family history includes Cancer in his father; Heart disease in his mother. No Known Allergies    Review of Systems  Constitutional: Negative for fever, chills and appetite change.  Respiratory: Negative for cough and  shortness of breath.   Cardiovascular: Negative for chest pain.  Gastrointestinal: Positive for constipation and abdominal distention. Negative for nausea, vomiting, abdominal pain, diarrhea and blood in stool.  Endocrine: Negative for polydipsia and polyuria.  Genitourinary: Negative for dysuria.       Objective:   Physical Exam  Constitutional: He appears well-developed and well-nourished.  Cardiovascular: Normal rate and regular rhythm.   Pulmonary/Chest: Effort normal and breath sounds normal. No respiratory distress. He has no wheezes. He has no rales.  Abdominal: Soft. Bowel sounds are normal. He exhibits distension. He exhibits no mass. There is no tenderness. There is no rebound and no guarding.  Genitourinary:  Rectal exam reveals no impaction. He has some soft brown stool in the distal rectal vault.          Assessment & Plan:  Constipation. No evidence for impaction on exam. He does have somewhat distended abdomen but normal bowel sounds and no evidence clinically for obstruction. No acute abdomen. Suspect he has significant stool throughout the colon which is not evacuating. We've recommended one more trial of Dulcolax and MiraLax as they have been previously instructed. He is on multiple medications which could be exacerbating constipation as above. Increase fluid intake.  Walking as much as tolerated.

## 2014-07-24 ENCOUNTER — Telehealth: Payer: Self-pay | Admitting: Internal Medicine

## 2014-07-24 ENCOUNTER — Ambulatory Visit: Payer: Medicare Other

## 2014-07-24 NOTE — Telephone Encounter (Signed)
Pt seen yesterday and instructed to cb w/ update.  The med pt took last night did work and pt is doing better. Pt had a bm.  Pt will cb if need again. Thanks!

## 2014-07-30 ENCOUNTER — Ambulatory Visit (INDEPENDENT_AMBULATORY_CARE_PROVIDER_SITE_OTHER): Payer: Medicare Other | Admitting: Family Medicine

## 2014-07-30 ENCOUNTER — Encounter: Payer: Self-pay | Admitting: Family Medicine

## 2014-07-30 VITALS — BP 132/78 | HR 79 | Temp 98.1°F | Wt 241.0 lb

## 2014-07-30 DIAGNOSIS — K5900 Constipation, unspecified: Secondary | ICD-10-CM | POA: Diagnosis not present

## 2014-07-30 DIAGNOSIS — K5909 Other constipation: Secondary | ICD-10-CM

## 2014-07-30 NOTE — Progress Notes (Signed)
Pre visit review using our clinic review tool, if applicable. No additional management support is needed unless otherwise documented below in the visit note. 

## 2014-07-30 NOTE — Progress Notes (Signed)
Subjective:    Patient ID: Walter Gill, male    DOB: Nov 16, 1934, 80 y.o.   MRN: [id number]  Constipation Pertinent negatives include no abdominal pain, diarrhea, nausea or vomiting.   Patient seen for followup regarding constipation. Refer to prior note:  " Patient seen with decreased frequency of bowel movements over the past several weeks. He had prior history of impaction. He has some diffuse abdominal bloating but no nausea or vomiting. Over the past 6 days only had one small bowel movement which was this morning. This was low volume bowel movement. Last week he took several doses of Dulcolax and MiraLax and after about 14 hours finally had small bowel movement.  He was previously taking fiber and was instructed to stop this last week for fear that this may be exacerbating his problem. He had TSH which was normal last October. He has several risk factors for constipation including immobility and multiple medications which could exacerbate including chronic opioid use, furosemide,Sanctura, Sinemet"  He did repeat treatment with Dulcolax and MiraLax and had bowel movement last week but has not had any now since Friday. He is drinking plenty of fluids. No relief with fiber consumption. Ambulation is limited. Denies abdominal pain. Good appetite. No nausea or vomiting. No impaction on exam last visit.  Past Medical History  Diagnosis Date  . Sleep apnea   . RBBB (right bundle branch block)   . Parkinson disease   . Gout   . Hyperlipidemia   . Hypertension   . OA (osteoarthritis)   . PE (pulmonary embolism) september 2008  . DVT (deep venous thrombosis)   . Left ventricular dysfunction     impaired LV relaxation   Past Surgical History  Procedure Laterality Date  . Appendectomy    . Cholecystectomy    . Dupruytens contracture    . Total hip arthroplasty  9/8/8    right  . Pilonidal cyst removal    . Eye surgery      ? for lid lag vs apraxia of eye opening  . Cataract  extraction      reports that he has never smoked. He has never used smokeless tobacco. He reports that he does not drink alcohol or use illicit drugs. family history includes Cancer in his father; Heart disease in his mother. No Known Allergies    Review of Systems  Constitutional: Negative for appetite change and unexpected weight change.  Respiratory: Negative for cough and shortness of breath.   Cardiovascular: Negative for chest pain.  Gastrointestinal: Positive for constipation. Negative for nausea, vomiting, abdominal pain and diarrhea.       Objective:   Physical Exam  Constitutional: He appears well-developed and well-nourished.  Cardiovascular: Normal rate and regular rhythm.   Pulmonary/Chest: Effort normal and breath sounds normal. No respiratory distress. He has no wheezes. He has no rales.  Abdominal: Soft. Bowel sounds are normal. He exhibits no distension and no mass. There is no tenderness. There is no rebound and no guarding.  Abdomen is slightly distended but soft and nontender          Assessment & Plan:  Chronic constipation in a patient on chronic opioids and anticholinergic medications. No impaction by recent exam last week. We've recommended GI referral. We discussed possible use of medication such as amitiza or Linzess and they prefer to discuss with gastroenterology.  He is staying well hydrated and adequate fiber.  No relief with stool softeners and requiring high doses of Miralax for  any results.

## 2014-07-30 NOTE — Patient Instructions (Signed)
We will call you with GI appointment. 

## 2014-08-04 ENCOUNTER — Telehealth: Payer: Self-pay | Admitting: Internal Medicine

## 2014-08-04 NOTE — Telephone Encounter (Signed)
Pt request refill HYDROcodone-acetaminophen (NORCO) 10-325 MG per tablet °

## 2014-08-05 MED ORDER — HYDROCODONE-ACETAMINOPHEN 10-325 MG PO TABS: ORAL_TABLET | ORAL | Status: DC

## 2014-08-05 NOTE — Telephone Encounter (Signed)
Dr Elease Hashimoto, can you sign for this since you have seen this patient the last 2 times?

## 2014-08-05 NOTE — Telephone Encounter (Signed)
Yes he has an appt with Dr Yong Channel in December.  Rx on your desk

## 2014-08-05 NOTE — Telephone Encounter (Signed)
OK to refill until he can establish with another primary.  Has he set up to see someone else?

## 2014-08-06 ENCOUNTER — Ambulatory Visit (INDEPENDENT_AMBULATORY_CARE_PROVIDER_SITE_OTHER): Payer: Medicare Other | Admitting: Neurology

## 2014-08-06 DIAGNOSIS — E538 Deficiency of other specified B group vitamins: Secondary | ICD-10-CM

## 2014-08-06 MED ORDER — CYANOCOBALAMIN 1000 MCG/ML IJ SOLN
1000.0000 ug | Freq: Once | INTRAMUSCULAR | Status: AC
  Administered 2014-08-06: 1000 ug via INTRAMUSCULAR

## 2014-08-06 NOTE — Progress Notes (Signed)
B12 injection to right deltoid with no apparent complication.

## 2014-08-12 ENCOUNTER — Encounter: Payer: Self-pay | Admitting: Physician Assistant

## 2014-08-12 ENCOUNTER — Ambulatory Visit (INDEPENDENT_AMBULATORY_CARE_PROVIDER_SITE_OTHER): Payer: Medicare Other | Admitting: Physician Assistant

## 2014-08-12 VITALS — BP 138/72 | HR 68 | Ht 67.0 in | Wt 238.8 lb

## 2014-08-12 DIAGNOSIS — K5909 Other constipation: Secondary | ICD-10-CM

## 2014-08-12 DIAGNOSIS — K5900 Constipation, unspecified: Secondary | ICD-10-CM | POA: Diagnosis not present

## 2014-08-12 MED ORDER — MOVIPREP 100 G PO SOLR: 1.0000 | ORAL | Status: DC

## 2014-08-12 MED ORDER — POLYETHYLENE GLYCOL 3350 17 GM/SCOOP PO POWD: 17.0000 g | Freq: Every day | ORAL | Status: DC | PRN

## 2014-08-12 NOTE — Patient Instructions (Signed)
We have given you a free coupon to get the Moviprep at [place]. Also purchase a box of dulcolax tablets.  Take 2 Dulcolax tablets before you do the Moviprep. There are 2 doses in the Moviprep box.  Place water up to the fill line in the container and put in packet A and Packet B.  Chill or add ice cubes before drinking.  This will be 32 ounces.  For a second dose, fill container up to the fill line with water and add packet A and packet B.  Chill or add ice cubes and drink.    We also sent a prescription for the generic Miralax. Take 17 grams daily in 8 0z of water.  Call us back in 1 week with a progress report and ask for [facility].

## 2014-08-12 NOTE — Progress Notes (Signed)
Subjective:    Patient ID: Walter Gill, male    DOB: [birth year], 80 y.o.   MRN: [id number]  HPI  Walter Gill is a pleasant 23 H. or old white male referred by his PCP Dr. Elease Gill for evaluation of severe constipation. Patient is known remotely to Dr. Deatra Gill from a sigmoidoscopy done in 1997 which showed diverticulosis. His wife tells me that colonoscopy was unable to be done due to tortuosity and he had subsequent BE. Patient has Parkinson's disease, hypertension, congestive heart failure, history of obstructive sleep apnea, osteoarthritis with chronic back pain and prior history of a pulmonary embolism. He is on multiple medications including chronic hydrocodone for back pain. Patient states that he has had some problems with constipation which was being managed with as needed MiraLax however since June he has had severe problems with constipation which she's been unable to resolve. He went for several weeks in June without a bowel movement. He had called here and was told to do a Dulcolax and MiraLax  Purge which occurred late in July. He then saw his PCP on 8/12 with continued complaints of obstipation. Rectal exam was done and there was no evidence of impaction and he was asked to do another to collapse and MiraLax purge. He says he did have one bowel movement without purge which was about 10 days ago and has not had any bowel movements since. He says he feels somewhat bloated and uncomfortable but his appetite has been fine and he's been eating without difficulty. No melena or hematochezia.   Review of Systems  HENT: Negative.   Eyes: Negative.   Respiratory: Negative.   Cardiovascular: Negative.   Gastrointestinal: Positive for constipation and abdominal distention.  Endocrine: Negative.   Genitourinary: Positive for difficulty urinating.  Musculoskeletal: Positive for back pain and gait problem.  Skin: Negative.   Allergic/Immunologic: Negative.   Neurological: Positive for tremors and  weakness.  Hematological: Negative.   Psychiatric/Behavioral: Negative.    Outpatient Prescriptions Prior to Visit  Medication Sig Dispense Refill  . allopurinol (ZYLOPRIM) 100 MG tablet TAKE 1 TABLET BY MOUTH EVERY DAY  90 tablet  1  . AMBULATORY NON FORMULARY MEDICATION 1 Units by Does not apply route daily. Set CPAP to 12  1 Units  0  . aspirin 81 MG chewable tablet Chew 81 mg by mouth at bedtime.       . carbidopa-levodopa (SINEMET CR) 50-200 MG per tablet Take 1 tablet by mouth 5 (five) times daily.  450 tablet  3  . CRESTOR 10 MG tablet TAKE 1 TABLET DAILY  90 tablet  2  . docusate sodium (COLACE) 100 MG capsule Take 100 mg by mouth 2 (two) times daily.        . furosemide (LASIX) 40 MG tablet TAKE 1 TABLET BY MOUTH TWICE A DAY  60 tablet  5  . HYDROcodone-acetaminophen (NORCO) 10-325 MG per tablet TAKE 1 TABLET BY MOUTH TWICE A DAY AS NEEDED FOR PAIN  60 tablet  0  . KLOR-CON M20 20 MEQ tablet TAKE 1 TABLET TWICE A DAY  180 tablet  3  . Mirabegron ER (MYRBETRIQ) 25 MG TB24 Take 25 mg by mouth at bedtime.       . nitrofurantoin, macrocrystal-monohydrate, (MACROBID) 100 MG capsule Take 100 mg by mouth at bedtime.       Marland Kitchen SANCTURA XR 60 MG CP24 Take 60 mg by mouth every morning.       . selegiline (ELDEPRYL) 5 MG capsule  Take 1 capsule (5 mg total) by mouth 2 (two) times daily before a meal.  180 capsule  3  . tamsulosin (FLOMAX) 0.4 MG CAPS Take 0.4 mg by mouth at bedtime.      . polyethylene glycol powder (GLYCOLAX/MIRALAX) powder Take 17 g by mouth daily as needed (constipation).        No facility-administered medications prior to visit.   No Known Allergies Patient Active Problem List   Diagnosis Date Noted  . Obesity (BMI 30-39.9) 09/20/2013  . B12 deficiency 06/06/2013  . OSA on CPAP 04/17/2013  . REM behavioral disorder 10/25/2012  . EDEMA 02/06/2008  . DVT, HX OF 10/16/2007  . PULMONARY EMBOLISM 10/08/2007  . HYPERLIPIDEMIA 07/02/2007  . GOUT 07/02/2007  . PARKINSON'S  DISEASE 07/02/2007  . HYPERTENSION 07/02/2007  . RBBB 07/02/2007  . DIASTOLIC DYSFUNCTION 93/81/0175  . OSTEOARTHRITIS 07/02/2007  . DEGENERATIVE JOINT DISEASE, RIGHT HIP 07/02/2007      History  Substance Use Topics  . Smoking status: Never Smoker   . Smokeless tobacco: Never Used  . Alcohol Use: No   family history includes Cancer in his father; Heart disease in his mother.  Objective:   Physical Exam  well-developed elderly white male in no acute distress, accompanied by his wife patient uses a walker , blood pressure 130/72, Pulse 68, Height 5 foot 7, Weight 238. HEENT; nontraumatic normocephalic EOMI PERRLA sclera anicteric, Supple; no JVD, Cardiovascular; regular rate and rhythm with S1-S2 no murmur or gallop, Pulmonary ;clear bilaterally, Abdomen ;large soft nondistended bowel sounds are present there is no palpable mass or hepatosplenomegaly space we nontender, Rectal; exam not done, Extremities; no clubbing cyanosis or edema skin warm and dry, Psych; mood and affect appropriate        Assessment & Plan:  #80 year old white male with chronic severe constipation likely multifactorial with underlying Parkinson's disease, and use of anti-cholinergic medications , chronic opiates, and diuretics  #2 Parkinson's disease with decreased mobility   #3 osteoarthritis /AU and will and is in chronic back pain next number  #4 hypertension #5 congestive heart failure- on Lasix  Plan; To start Will give patient a Moviprep , he is also asked to take 2 dulcolax at the beginning of them in the prep. Once his bowels are purged he will start on MiraLax 17 g in 8 ounces of water every day and will also start a trial of Amitiza  8 mcg by mouth twice daily. Patient was given samples of Amitiza today--if this is not effective we'll try the higher dose. He  Is asked to call back in a week with progress report.

## 2014-08-13 NOTE — Progress Notes (Signed)
Reviewed and agree with management.  It measures are unsuccessful may try [place] Curia D. Deatra Ina, M.D., [facility]

## 2014-08-19 ENCOUNTER — Telehealth: Payer: Self-pay | Admitting: Physician Assistant

## 2014-08-19 MED ORDER — LUBIPROSTONE 24 MCG PO CAPS: 24.0000 ug | ORAL_CAPSULE | Freq: Two times a day (BID) | ORAL | Status: DC

## 2014-08-19 NOTE — Telephone Encounter (Signed)
Patient['s wife calling with update on patient's constipation. She gave him the "cleanse" as ordered by Nicoletta Ba, PA last week. Patient had 3 large bowel movements. The next day, he had a small bowel movement. He has not had a bowel movement now for 5 days. He is taking Amitiza 8 mcg BID and Miralax daily. He refused to eat this AM. He has not done this before. Nicoletta Ba, PA out of office. Please, advise.

## 2014-08-19 NOTE — Telephone Encounter (Signed)
Rx sent to pharmacy. Patient's wife notified.

## 2014-08-19 NOTE — Telephone Encounter (Signed)
Increase amitiza to 82mcg bid. C/b 3-4 days

## 2014-08-22 ENCOUNTER — Telehealth: Payer: Self-pay | Admitting: Physician Assistant

## 2014-08-22 NOTE — Telephone Encounter (Signed)
Ok,can stop Amitiza if not working- increase Miralax to 17 gm twice daily every day, and take 2 dulcolax tabs every day- see if that helps

## 2014-08-22 NOTE — Telephone Encounter (Signed)
Spoke with patient's wife and gave him Nicoletta Ba, [place] recommendations.

## 2014-08-22 NOTE — Telephone Encounter (Signed)
Spoke with patient and he states he is taking the Amitiza 24 mcg BID and Miralax once or twice daily. He is passing gas but no stools Stomach feels hard. Please, advise.

## 2014-08-25 ENCOUNTER — Telehealth: Payer: Self-pay | Admitting: Physician Assistant

## 2014-08-25 NOTE — Telephone Encounter (Signed)
Spoke with patient's wife and the Miralax and Dulcolax is helping. He is passing gas and having bowel movements. She will continue this week with this regimen.

## 2014-08-28 ENCOUNTER — Telehealth: Payer: Self-pay | Admitting: Physician Assistant

## 2014-08-28 MED ORDER — LINACLOTIDE 145 MCG PO CAPS: ORAL_CAPSULE | ORAL | Status: DC

## 2014-08-28 MED ORDER — MOVIPREP 100 G PO SOLR: ORAL | Status: DC

## 2014-08-28 NOTE — Telephone Encounter (Signed)
Lets give him another bowel prep with Moviprep- he can take half of it -see if that works if not do the second dose next day. Also lets give him a trial of LInzess 162mcg daily in addition to saying on miralax daily- give samples or a coupon -thanks

## 2014-08-28 NOTE — Telephone Encounter (Signed)
Rx's sent to pharmacy. Patient's wife given recommendations.

## 2014-08-28 NOTE — Telephone Encounter (Signed)
Spoke with wife and patient is taking Miralax 2 doses and Dulcolax 2 tablets. He is passing gas and a little bit of "sludge stool" with gas but no large size bowel movement. States he cannot go out of the house because of the bowel issues. Please, advise.

## 2014-08-29 ENCOUNTER — Telehealth: Payer: Self-pay | Admitting: Physician Assistant

## 2014-08-29 NOTE — Telephone Encounter (Signed)
Good, and Linzess should not interfere with his other meds

## 2014-08-29 NOTE — Telephone Encounter (Signed)
Spoke with patient's wife and she states the first part of the Moviprep helped him a lot. She will give the second part today. She is going to have him start [place] tomorrow.

## 2014-09-01 ENCOUNTER — Telehealth: Payer: Self-pay | Admitting: Physician Assistant

## 2014-09-01 ENCOUNTER — Telehealth: Payer: Self-pay | Admitting: Internal Medicine

## 2014-09-01 NOTE — Telephone Encounter (Signed)
Spoke with patient's wife and he took the entire Moviprep. He cleaned out good. She started the Linzess on Saturday with Miralax BID. He has not had a good BM since he started this. She is concerned that he is not going with so much medication. Please, advise.

## 2014-09-01 NOTE — Telephone Encounter (Signed)
Pt request refill of the following: HYDROcodone-acetaminophen (NORCO) 10-325 MG per tablet ° ° ° °Phamacy:   Pick up  ° °

## 2014-09-01 NOTE — Telephone Encounter (Signed)
Keep at it... It make take  A few days since bowel was purged

## 2014-09-02 ENCOUNTER — Telehealth: Payer: Self-pay | Admitting: Neurology

## 2014-09-02 NOTE — Telephone Encounter (Signed)
Patient made aware.

## 2014-09-02 NOTE — Telephone Encounter (Signed)
Pt's spouse called to cancel his b-12 shot today due to the pt being constipated and has been Having constipation problems for a couple of weeks now. Pt is seeing [facility] and would like for Dr. Carles Collet to review his records From GI when she gets a chance.  C/B [phone]

## 2014-09-02 NOTE — Telephone Encounter (Signed)
Spoke with patient's wife and gave her recommendations. 

## 2014-09-02 NOTE — Telephone Encounter (Signed)
fyi

## 2014-09-02 NOTE — Telephone Encounter (Signed)
I have looked.  I hate to hear he is having so many problems!  While I agree that PD can certainly contribute, I think that the chronic narcotics are likely the biggest problem regarding this constipation problem.

## 2014-09-03 ENCOUNTER — Ambulatory Visit: Payer: Medicare Other

## 2014-09-03 NOTE — Telephone Encounter (Signed)
Could you sign for this?  Pt will establish with you in December

## 2014-09-03 NOTE — Telephone Encounter (Signed)
No narcotics require office visit. Please have patient schedule> I do not guarantee that I will continue to prescribe chronically.

## 2014-09-03 NOTE — Telephone Encounter (Signed)
Walter Gill can you please schedule pt to come in to discuss this medication. Thanks

## 2014-09-04 ENCOUNTER — Telehealth: Payer: Self-pay | Admitting: Physician Assistant

## 2014-09-04 MED ORDER — HYDROCODONE-ACETAMINOPHEN 10-325 MG PO TABS: ORAL_TABLET | ORAL | Status: DC

## 2014-09-04 MED ORDER — NALOXEGOL OXALATE 25 MG PO TABS: 25.0000 mg | ORAL_TABLET | Freq: Every day | ORAL | Status: DC

## 2014-09-04 NOTE — Telephone Encounter (Signed)
Under circumstances, I am willing to give 1x refill, noting that narcotics likely contributing to constipation. i would like for him to be seen before further refills though. Dr. Erick Blinks panel currently full.

## 2014-09-04 NOTE — Telephone Encounter (Signed)
Pt wife states her husband cant go out of the house because he is constipated and is on a lot of medication to help with this and pt is home bound because he is afraid of having a BM on himself if he goes out of the house. Pt wife states that Dr. Elease Hashimoto has seen pt twice for this problem and would like to know if Dr. Elease Hashimoto will rite this Rx for pt and continue to see pt for this since he is familiar with what is goin on. Pt wife states that pt takes this medication due to bone on bone in his knees and has Parkinsons along with other health issues causing pain. Dr. Yong Channel Dr. Elease Hashimoto please advise

## 2014-09-04 NOTE — Telephone Encounter (Signed)
Pt notified that Rx is ready and upfront

## 2014-09-04 NOTE — Telephone Encounter (Signed)
Pt wife said he can not come in. Wife said he has been seeing a GI because he has not been able to go to the bathroom and they have him on different medication to help him go to the bath room. She would like a call [phone]

## 2014-09-04 NOTE — Telephone Encounter (Signed)
Spoke with patient's wife and gave her recommendations. Rx sent to pharmacy.

## 2014-09-04 NOTE — Telephone Encounter (Signed)
Spoke with patient's wife and he is taking Linzess 145 mcg and Miralax BID. He only had a small bowel movement yesterday. He has not had a good bowel movement since his bowel purge last Friday. Please, advise

## 2014-09-04 NOTE — Telephone Encounter (Signed)
Begin Movantik 25mg  qd; d/c miralax, linzess. C/b 1 week

## 2014-09-05 ENCOUNTER — Telehealth: Payer: Self-pay | Admitting: Gastroenterology

## 2014-09-05 NOTE — Telephone Encounter (Signed)
MOVANTIK HAS BEEN APPROVED CALLED PT TO INFORM

## 2014-09-17 DIAGNOSIS — Z23 Encounter for immunization: Secondary | ICD-10-CM | POA: Diagnosis not present

## 2014-09-18 ENCOUNTER — Ambulatory Visit: Payer: Medicare Other | Admitting: Occupational Therapy

## 2014-09-18 ENCOUNTER — Ambulatory Visit: Payer: Medicare Other

## 2014-09-18 ENCOUNTER — Ambulatory Visit: Payer: Medicare Other | Attending: Neurology | Admitting: Physical Therapy

## 2014-09-21 ENCOUNTER — Other Ambulatory Visit: Payer: Self-pay | Admitting: Internal Medicine

## 2014-09-21 ENCOUNTER — Other Ambulatory Visit: Payer: Self-pay | Admitting: Neurology

## 2014-09-22 NOTE — Telephone Encounter (Signed)
This medication not mentioned in previous notes. Please advise if okay to refill.

## 2014-09-25 ENCOUNTER — Encounter: Payer: Self-pay | Admitting: Neurology

## 2014-09-25 ENCOUNTER — Ambulatory Visit (INDEPENDENT_AMBULATORY_CARE_PROVIDER_SITE_OTHER): Payer: Medicare Other | Admitting: Neurology

## 2014-09-25 VITALS — BP 120/58 | HR 80 | Ht 69.0 in | Wt 238.0 lb

## 2014-09-25 DIAGNOSIS — G4752 REM sleep behavior disorder: Secondary | ICD-10-CM | POA: Diagnosis not present

## 2014-09-25 DIAGNOSIS — E669 Obesity, unspecified: Secondary | ICD-10-CM

## 2014-09-25 DIAGNOSIS — Z9989 Dependence on other enabling machines and devices: Secondary | ICD-10-CM

## 2014-09-25 DIAGNOSIS — K5909 Other constipation: Secondary | ICD-10-CM

## 2014-09-25 DIAGNOSIS — E538 Deficiency of other specified B group vitamins: Secondary | ICD-10-CM

## 2014-09-25 DIAGNOSIS — G4733 Obstructive sleep apnea (adult) (pediatric): Secondary | ICD-10-CM | POA: Diagnosis not present

## 2014-09-25 DIAGNOSIS — G20 Parkinson's disease: Secondary | ICD-10-CM | POA: Diagnosis not present

## 2014-09-25 DIAGNOSIS — K5903 Drug induced constipation: Secondary | ICD-10-CM

## 2014-09-25 DIAGNOSIS — T50905A Adverse effect of unspecified drugs, medicaments and biological substances, initial encounter: Secondary | ICD-10-CM

## 2014-09-25 MED ORDER — CYANOCOBALAMIN 1000 MCG/ML IJ SOLN
1000.0000 ug | Freq: Once | INTRAMUSCULAR | Status: AC
  Administered 2014-09-25: 1000 ug via INTRAMUSCULAR

## 2014-09-25 NOTE — Patient Instructions (Signed)
1. Return in one month for b12 injection.
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
## 2014-09-29 ENCOUNTER — Ambulatory Visit: Payer: Medicare Other | Admitting: Neurology

## 2014-09-29 ENCOUNTER — Other Ambulatory Visit: Payer: Self-pay | Admitting: Gastroenterology

## 2014-10-01 ENCOUNTER — Ambulatory Visit (INDEPENDENT_AMBULATORY_CARE_PROVIDER_SITE_OTHER): Payer: Medicare Other | Admitting: Family Medicine

## 2014-10-01 ENCOUNTER — Encounter: Payer: Self-pay | Admitting: Family Medicine

## 2014-10-01 VITALS — BP 146/82 | HR 72 | Temp 98.1°F | Wt 238.0 lb

## 2014-10-01 DIAGNOSIS — M1611 Unilateral primary osteoarthritis, right hip: Secondary | ICD-10-CM | POA: Diagnosis not present

## 2014-10-01 DIAGNOSIS — M150 Primary generalized (osteo)arthritis: Secondary | ICD-10-CM | POA: Diagnosis not present

## 2014-10-01 DIAGNOSIS — G894 Chronic pain syndrome: Secondary | ICD-10-CM | POA: Diagnosis not present

## 2014-10-01 DIAGNOSIS — M159 Polyosteoarthritis, unspecified: Secondary | ICD-10-CM

## 2014-10-01 MED ORDER — HYDROCODONE-ACETAMINOPHEN 10-325 MG PO TABS: ORAL_TABLET | ORAL | Status: DC

## 2014-10-01 NOTE — Assessment & Plan Note (Addendum)
Due to dystonia. Reasonable control on hydrocodone 10/325 BID. Provided 2 month refill with plan for 3 month refill at 12/01/14 visit.

## 2014-10-01 NOTE — Progress Notes (Signed)
[name], MD Phone: [phone]  Subjective:   Walter Gill is a 80 y.o. year old very pleasant male patient who presents with the following:  Chronic Pain- reasonable control Causes: Right Hip/knee arthritis Dystonia possibly Parkinson's related -when stands up, he has dystonia which pulls him over to the right and walks with arm swinging low on right side and uses walker. He has a pain with the pull of the dystonia. Could not determine specific muscle so unable to get injections such as botox. Takes hydrocodone twice a day. 10/325. Stable for 2-3 years. Originally started by Dr. Nelva Bush of [facility]. This medicine takes the edge off. Pain up to 8/10 improved to normal 4-6/10 which he can tolerate and he is able to walk with this.   Patient also has right knee "bone on bone" and R hip arthritis. Received shots from knee but had not been able to follow up for final synvisc injections.   ROS-goes to bathroom about every 5 days since following with GI.   Past Medical History- Patient Active Problem List   Diagnosis Date Noted  . Chronic pain syndrome 10/01/2014    Priority: High  . Obesity (BMI 30-39.9) 09/20/2013  . B12 deficiency 06/06/2013  . OSA on CPAP 04/17/2013  . REM behavioral disorder 10/25/2012  . EDEMA 02/06/2008  . DVT, HX OF 10/16/2007  . PULMONARY EMBOLISM 10/08/2007  . HYPERLIPIDEMIA 07/02/2007  . GOUT 07/02/2007  . PARKINSON'S DISEASE 07/02/2007  . HYPERTENSION 07/02/2007  . RBBB 07/02/2007  . DIASTOLIC DYSFUNCTION 86/76/1948  . OSTEOARTHRITIS 07/02/2007  . DEGENERATIVE JOINT DISEASE, RIGHT HIP 07/02/2007   Medications- reviewed and updated Current Outpatient Prescriptions  Medication Sig Dispense Refill  . allopurinol (ZYLOPRIM) 100 MG tablet TAKE 1 TABLET BY MOUTH EVERY DAY  90 tablet  1  . AMBULATORY NON FORMULARY MEDICATION 1 Units by Does not apply route daily. Set CPAP to 12  1 Units  0  . aspirin 81 MG chewable tablet Chew 81 mg by  mouth at bedtime.       . carbidopa-levodopa (SINEMET CR) 50-200 MG per tablet Take 1 tablet by mouth 5 (five) times daily.  450 tablet  3  . CRESTOR 10 MG tablet TAKE 1 TABLET DAILY  90 tablet  0  . docusate sodium (COLACE) 100 MG capsule Take 100 mg by mouth 2 (two) times daily.        . furosemide (LASIX) 40 MG tablet TAKE 1 TABLET BY MOUTH TWICE A DAY  60 tablet  5  . HYDROcodone-acetaminophen (NORCO) 10-325 MG per tablet TAKE 1 TABLET BY MOUTH TWICE A DAY AS NEEDED FOR PAIN  60 tablet  0  . KLOR-CON M20 20 MEQ tablet TAKE 1 TABLET TWICE A DAY  180 tablet  3  . lubiprostone (AMITIZA) 24 MCG capsule Take 1 capsule (24 mcg total) by mouth 2 (two) times daily with a meal.  60 capsule  0  . Mirabegron ER (MYRBETRIQ) 25 MG TB24 Take 25 mg by mouth at bedtime.       Marland Kitchen MOVANTIK 25 MG TABS TAKE 1 TABLET BY MOUTH DAILY  30 tablet  0  . nitrofurantoin, macrocrystal-monohydrate, (MACROBID) 100 MG capsule Take 100 mg by mouth at bedtime.       Marland Kitchen SANCTURA XR 60 MG CP24 Take 60 mg by mouth every morning.       . selegiline (ELDEPRYL) 5 MG capsule TAKE 1 CAPSULE TWICE DAILY BEFORE MEALS  180 capsule  3  .  tamsulosin (FLOMAX) 0.4 MG CAPS capsule TAKE 1 CAPSULE DAILY  90 capsule  0   No current facility-administered medications for this visit.    Objective: BP 146/82  Pulse 72  Temp(Src) 98.1 F (36.7 C)  Wt 238 lb (107.956 kg) Gen: NAD, resting comfortably in chair CV: RRR no murmurs rubs or gallops Lungs: CTAB no crackles, wheeze, rhonchi Abdomen: soft/nontender/nondistended/normal bowel sounds.  Ext: 1+ pitting edema chronic  Skin: warm, dry Neuro: when patient stands up, he leans toward the right 10 to 20 degrees (states gets worse when tired)   Assessment/Plan:  Chronic pain syndrome with causes of dystonia and Right Knee/hip arthritis Due to dystonia. Reasonable control on hydrocodone 10/325 BID. Provided 2 month refill with plan for 3 month refill at 12/01/14 visit.   Meds ordered this  encounter  Medications  . HYDROcodone-acetaminophen (NORCO) 10-325 MG per tablet    Sig: TAKE 1 TABLET BY MOUTH TWICE A DAY AS NEEDED FOR PAIN    Dispense:  60 tablet    Refill:  0    May fill 10/01/14  . HYDROcodone-acetaminophen (NORCO) 10-325 MG per tablet    Sig: TAKE 1 TABLET BY MOUTH TWICE A DAY AS NEEDED FOR PAIN    Dispense:  60 tablet    Refill:  0    May fill 11/01/14

## 2014-10-01 NOTE — Patient Instructions (Addendum)
Refilled pain medicine x 2 months. See you back 12/01/14 to go through rest of history and give 3 month rx at that time.   Health Maintenance Due  Topic Date Due  . Zostavax - patient declined 12/27/1995

## 2014-10-13 ENCOUNTER — Other Ambulatory Visit: Payer: Self-pay | Admitting: Internal Medicine

## 2014-10-24 ENCOUNTER — Encounter (HOSPITAL_COMMUNITY): Payer: Self-pay | Admitting: Emergency Medicine

## 2014-10-24 ENCOUNTER — Inpatient Hospital Stay (HOSPITAL_COMMUNITY)
Admission: EM | Admit: 2014-10-24 | Discharge: 2014-10-29 | DRG: 389 | Disposition: A | Discharge status: Home / Self Care | Payer: Medicare Other | Attending: Internal Medicine | Admitting: Internal Medicine

## 2014-10-24 ENCOUNTER — Emergency Department (HOSPITAL_COMMUNITY): Payer: Medicare Other

## 2014-10-24 DIAGNOSIS — K5649 Other impaction of intestine: Principal | ICD-10-CM

## 2014-10-24 DIAGNOSIS — T40605A Adverse effect of unspecified narcotics, initial encounter: Secondary | ICD-10-CM | POA: Diagnosis present

## 2014-10-24 DIAGNOSIS — R509 Fever, unspecified: Secondary | ICD-10-CM

## 2014-10-24 DIAGNOSIS — K5641 Fecal impaction: Secondary | ICD-10-CM | POA: Diagnosis present

## 2014-10-24 DIAGNOSIS — G894 Chronic pain syndrome: Secondary | ICD-10-CM | POA: Diagnosis present

## 2014-10-24 DIAGNOSIS — Z96649 Presence of unspecified artificial hip joint: Secondary | ICD-10-CM | POA: Diagnosis present

## 2014-10-24 DIAGNOSIS — Z7982 Long term (current) use of aspirin: Secondary | ICD-10-CM

## 2014-10-24 DIAGNOSIS — R140 Abdominal distension (gaseous): Secondary | ICD-10-CM

## 2014-10-24 DIAGNOSIS — M109 Gout, unspecified: Secondary | ICD-10-CM | POA: Diagnosis present

## 2014-10-24 DIAGNOSIS — R109 Unspecified abdominal pain: Secondary | ICD-10-CM | POA: Diagnosis not present

## 2014-10-24 DIAGNOSIS — K5900 Constipation, unspecified: Secondary | ICD-10-CM

## 2014-10-24 DIAGNOSIS — I5032 Chronic diastolic (congestive) heart failure: Secondary | ICD-10-CM | POA: Diagnosis present

## 2014-10-24 DIAGNOSIS — G20 Parkinson's disease: Secondary | ICD-10-CM | POA: Diagnosis present

## 2014-10-24 DIAGNOSIS — Z6836 Body mass index (BMI) 36.0-36.9, adult: Secondary | ICD-10-CM

## 2014-10-24 DIAGNOSIS — I10 Essential (primary) hypertension: Secondary | ICD-10-CM | POA: Diagnosis present

## 2014-10-24 DIAGNOSIS — E669 Obesity, unspecified: Secondary | ICD-10-CM | POA: Diagnosis present

## 2014-10-24 DIAGNOSIS — K5901 Slow transit constipation: Secondary | ICD-10-CM | POA: Diagnosis not present

## 2014-10-24 DIAGNOSIS — Z9849 Cataract extraction status, unspecified eye: Secondary | ICD-10-CM | POA: Diagnosis not present

## 2014-10-24 DIAGNOSIS — E876 Hypokalemia: Secondary | ICD-10-CM | POA: Diagnosis present

## 2014-10-24 DIAGNOSIS — R5383 Other fatigue: Secondary | ICD-10-CM

## 2014-10-24 DIAGNOSIS — N401 Enlarged prostate with lower urinary tract symptoms: Secondary | ICD-10-CM | POA: Diagnosis not present

## 2014-10-24 DIAGNOSIS — Z9989 Dependence on other enabling machines and devices: Secondary | ICD-10-CM

## 2014-10-24 DIAGNOSIS — E785 Hyperlipidemia, unspecified: Secondary | ICD-10-CM | POA: Diagnosis present

## 2014-10-24 DIAGNOSIS — N39490 Overflow incontinence: Secondary | ICD-10-CM | POA: Diagnosis present

## 2014-10-24 DIAGNOSIS — N390 Urinary tract infection, site not specified: Secondary | ICD-10-CM | POA: Diagnosis present

## 2014-10-24 DIAGNOSIS — N400 Enlarged prostate without lower urinary tract symptoms: Secondary | ICD-10-CM | POA: Diagnosis present

## 2014-10-24 DIAGNOSIS — Z86711 Personal history of pulmonary embolism: Secondary | ICD-10-CM | POA: Diagnosis not present

## 2014-10-24 DIAGNOSIS — M1990 Unspecified osteoarthritis, unspecified site: Secondary | ICD-10-CM | POA: Diagnosis present

## 2014-10-24 DIAGNOSIS — R918 Other nonspecific abnormal finding of lung field: Secondary | ICD-10-CM | POA: Diagnosis not present

## 2014-10-24 DIAGNOSIS — G4733 Obstructive sleep apnea (adult) (pediatric): Secondary | ICD-10-CM | POA: Diagnosis present

## 2014-10-24 DIAGNOSIS — R10817 Generalized abdominal tenderness: Secondary | ICD-10-CM | POA: Diagnosis not present

## 2014-10-24 DIAGNOSIS — K2970 Gastritis, unspecified, without bleeding: Secondary | ICD-10-CM | POA: Diagnosis not present

## 2014-10-24 DIAGNOSIS — R4182 Altered mental status, unspecified: Secondary | ICD-10-CM | POA: Diagnosis not present

## 2014-10-24 DIAGNOSIS — R531 Weakness: Secondary | ICD-10-CM | POA: Diagnosis not present

## 2014-10-24 DIAGNOSIS — K6389 Other specified diseases of intestine: Secondary | ICD-10-CM | POA: Diagnosis not present

## 2014-10-24 LAB — URINE MICROSCOPIC-ADD ON

## 2014-10-24 LAB — CBC WITH DIFFERENTIAL/PLATELET
Basophils Absolute: 0 K/uL (ref 0.0–0.1)
Basophils Relative: 0 % (ref 0–1)
Eosinophils Absolute: 0 K/uL (ref 0.0–0.7)
Eosinophils Relative: 0 % (ref 0–5)
HCT: 43.1 % (ref 39.0–52.0)
Hemoglobin: 14.4 g/dL (ref 13.0–17.0)
Lymphocytes Relative: 5 % — ABNORMAL LOW (ref 12–46)
Lymphs Abs: 0.2 K/uL — ABNORMAL LOW (ref 0.7–4.0)
MCH: 28 pg (ref 26.0–34.0)
MCHC: 33.4 g/dL (ref 30.0–36.0)
MCV: 83.7 fL (ref 78.0–100.0)
Monocytes Absolute: 0.1 K/uL (ref 0.1–1.0)
Monocytes Relative: 2 % — ABNORMAL LOW (ref 3–12)
Neutro Abs: 4.3 K/uL (ref 1.7–7.7)
Neutrophils Relative %: 93 % — ABNORMAL HIGH (ref 43–77)
Platelets: 122 K/uL — ABNORMAL LOW (ref 150–400)
RBC: 5.15 MIL/uL (ref 4.22–5.81)
RDW: 14 % (ref 11.5–15.5)
WBC: 4.6 K/uL (ref 4.0–10.5)

## 2014-10-24 LAB — COMPREHENSIVE METABOLIC PANEL WITH GFR
ALT: <5 U/L (ref 0–53)
AST: 25 U/L (ref 0–37)
Albumin: 3.8 g/dL (ref 3.5–5.2)
Alkaline Phosphatase: 99 U/L (ref 39–117)
Anion gap: 17 — ABNORMAL HIGH (ref 5–15)
BUN: 21 mg/dL (ref 6–23)
CO2: 26 meq/L (ref 19–32)
Calcium: 9.4 mg/dL (ref 8.4–10.5)
Chloride: 96 meq/L (ref 96–112)
Creatinine, Ser: 0.63 mg/dL (ref 0.50–1.35)
GFR calc Af Amer: >90 mL/min
GFR calc non Af Amer: >90 mL/min
Glucose, Bld: 193 mg/dL — ABNORMAL HIGH (ref 70–99)
Potassium: 3.8 meq/L (ref 3.7–5.3)
Sodium: 139 meq/L (ref 137–147)
Total Bilirubin: 1.6 mg/dL — ABNORMAL HIGH (ref 0.3–1.2)
Total Protein: 7.6 g/dL (ref 6.0–8.3)

## 2014-10-24 LAB — URINALYSIS, ROUTINE W REFLEX MICROSCOPIC
BILIRUBIN URINE: NEGATIVE
Glucose, UA: NEGATIVE mg/dL
Hgb urine dipstick: NEGATIVE
Ketones, ur: NEGATIVE mg/dL
NITRITE: NEGATIVE
Protein, ur: NEGATIVE mg/dL
SPECIFIC GRAVITY, URINE: 1.012 (ref 1.005–1.030)
Urobilinogen, UA: 1 mg/dL (ref 0.0–1.0)
pH: 6 (ref 5.0–8.0)

## 2014-10-24 LAB — LIPASE, BLOOD: Lipase: 9 U/L — ABNORMAL LOW (ref 11–59)

## 2014-10-24 LAB — I-STAT CG4 LACTIC ACID, ED: Lactic Acid, Venous: 2.45 mmol/L — ABNORMAL HIGH (ref 0.5–2.2)

## 2014-10-24 MED ORDER — DARIFENACIN HYDROBROMIDE ER 15 MG PO TB24
15.0000 mg | ORAL_TABLET | Freq: Every day | ORAL | Status: DC
  Administered 2014-10-25 – 2014-10-29 (×5): 15 mg via ORAL
  Filled 2014-10-24 (×5): qty 1

## 2014-10-24 MED ORDER — PEG 3350-KCL-NABCB-NACL-NASULF 236 G PO SOLR
4000.0000 mL | Freq: Once | ORAL | Status: AC
  Administered 2014-10-25: 4000 mL via ORAL
  Filled 2014-10-24: qty 4000

## 2014-10-24 MED ORDER — CARBIDOPA-LEVODOPA ER 50-200 MG PO TBCR
1.0000 | EXTENDED_RELEASE_TABLET | Freq: Every day | ORAL | Status: DC
  Administered 2014-10-24 – 2014-10-29 (×24): 1 via ORAL
  Filled 2014-10-24 (×27): qty 1

## 2014-10-24 MED ORDER — NALOXEGOL OXALATE 25 MG PO TABS: 25.0000 mg | ORAL_TABLET | Freq: Every day | ORAL | Status: DC

## 2014-10-24 MED ORDER — NITROFURANTOIN MONOHYD MACRO 100 MG PO CAPS
100.0000 mg | ORAL_CAPSULE | Freq: Every day | ORAL | Status: DC
  Administered 2014-10-24 – 2014-10-28 (×5): 100 mg via ORAL
  Filled 2014-10-24 (×6): qty 1

## 2014-10-24 MED ORDER — TROSPIUM CHLORIDE ER 60 MG PO CP24: 60.0000 mg | ORAL_CAPSULE | Freq: Every morning | ORAL | Status: DC

## 2014-10-24 MED ORDER — MIRABEGRON ER 25 MG PO TB24
25.0000 mg | ORAL_TABLET | Freq: Every day | ORAL | Status: DC
  Administered 2014-10-24 – 2014-10-28 (×5): 25 mg via ORAL
  Filled 2014-10-24 (×6): qty 1

## 2014-10-24 MED ORDER — SODIUM CHLORIDE 0.9 % IV SOLN
INTRAVENOUS | Status: DC
  Administered 2014-10-24 – 2014-10-26 (×3): via INTRAVENOUS

## 2014-10-24 MED ORDER — ASPIRIN 81 MG PO CHEW
81.0000 mg | CHEWABLE_TABLET | Freq: Every day | ORAL | Status: DC
  Administered 2014-10-24 – 2014-10-28 (×5): 81 mg via ORAL
  Filled 2014-10-24 (×6): qty 1

## 2014-10-24 MED ORDER — ROSUVASTATIN CALCIUM 10 MG PO TABS
10.0000 mg | ORAL_TABLET | Freq: Every day | ORAL | Status: DC
  Administered 2014-10-25 – 2014-10-28 (×4): 10 mg via ORAL
  Filled 2014-10-24 (×5): qty 1

## 2014-10-24 MED ORDER — SELEGILINE HCL 5 MG PO CAPS
5.0000 mg | ORAL_CAPSULE | Freq: Two times a day (BID) | ORAL | Status: DC
  Administered 2014-10-24 – 2014-10-29 (×10): 5 mg via ORAL
  Filled 2014-10-24 (×11): qty 1

## 2014-10-24 MED ORDER — SODIUM CHLORIDE 0.9 % IV SOLN: INTRAVENOUS | Status: DC

## 2014-10-24 MED ORDER — SODIUM CHLORIDE 0.9 % IV BOLUS (SEPSIS)
500.0000 mL | Freq: Once | INTRAVENOUS | Status: AC
  Administered 2014-10-24: 500 mL via INTRAVENOUS

## 2014-10-24 MED ORDER — HEPARIN SODIUM (PORCINE) 5000 UNIT/ML IJ SOLN
5000.0000 [IU] | Freq: Three times a day (TID) | INTRAMUSCULAR | Status: DC
  Administered 2014-10-24 – 2014-10-29 (×15): 5000 [IU] via SUBCUTANEOUS
  Filled 2014-10-24 (×17): qty 1

## 2014-10-24 MED ORDER — HYDROCODONE-ACETAMINOPHEN 10-325 MG PO TABS: 1.0000 | ORAL_TABLET | Freq: Two times a day (BID) | ORAL | Status: DC | PRN

## 2014-10-24 MED ORDER — MORPHINE SULFATE 4 MG/ML IJ SOLN
2.0000 mg | Freq: Once | INTRAMUSCULAR | Status: AC
  Administered 2014-10-24: 2 mg via INTRAVENOUS
  Filled 2014-10-24: qty 1

## 2014-10-24 MED ORDER — SODIUM CHLORIDE 0.9 % IV SOLN
INTRAVENOUS | Status: DC
  Administered 2014-10-24: 19:00:00 via INTRAVENOUS

## 2014-10-24 MED ORDER — ALLOPURINOL 100 MG PO TABS
100.0000 mg | ORAL_TABLET | Freq: Every day | ORAL | Status: DC
  Administered 2014-10-25 – 2014-10-29 (×5): 100 mg via ORAL
  Filled 2014-10-24 (×5): qty 1

## 2014-10-24 MED ORDER — IOHEXOL 300 MG/ML  SOLN
100.0000 mL | Freq: Once | INTRAMUSCULAR | Status: AC | PRN
  Administered 2014-10-24: 100 mL via INTRAVENOUS

## 2014-10-24 MED ORDER — IOHEXOL 300 MG/ML  SOLN
50.0000 mL | Freq: Once | INTRAMUSCULAR | Status: AC | PRN
  Administered 2014-10-24: 1 mL via ORAL

## 2014-10-24 MED ORDER — TAMSULOSIN HCL 0.4 MG PO CAPS
0.4000 mg | ORAL_CAPSULE | Freq: Every day | ORAL | Status: DC
  Administered 2014-10-24 – 2014-10-28 (×5): 0.4 mg via ORAL
  Filled 2014-10-24 (×6): qty 1

## 2014-10-24 NOTE — ED Provider Notes (Signed)
CSN: [id number]     Arrival date & time 10/24/14  1705 History   First MD Initiated Contact with Patient 10/24/14 1711     Chief Complaint  Patient presents with  . Abdominal Pain     (Consider location/radiation/quality/duration/timing/severity/associated sxs/prior Treatment) The history is provided by the patient and medical records.    This is a 80 year old male with past medical history significant for Parkinson's disease, gout, hypertension, hyperlipidemia, presenting to the ED for abdominal pain. She states he has been having intermittent pains over the past 3 weeks which have become constant over the past 24 hours.  He states his abdomen feels distended and feels "hard". He denies any nausea or vomiting.  States he feels the need to have a bowel movement but has been unable to go. He did have some loose, light brown diarrhea earlier this morning.  Denies melena or hematochezia.  No fever, chills, urinary sx.  Prior abdominal surgeries include appendectomy and cholecystectomy.  Patient denies any chest pain, SOB, palpitations, dizziness, or weakness.  Patient followed by GI, Dr. Deatra Ina.  VS stable on arrival.  Past Medical History  Diagnosis Date  . Sleep apnea   . RBBB (right bundle branch block)   . Parkinson disease   . Gout   . Hyperlipidemia   . Hypertension   . OA (osteoarthritis)   . PE (pulmonary embolism) september 2008  . DVT (deep venous thrombosis)   . Left ventricular dysfunction     impaired LV relaxation   Past Surgical History  Procedure Laterality Date  . Appendectomy    . Cholecystectomy    . Dupruytens contracture    . Total hip arthroplasty  9/8/8    right  . Pilonidal cyst removal    . Eye surgery      ? for lid lag vs apraxia of eye opening  . Cataract extraction     Family History  Problem Relation Age of Onset  . Heart disease Mother   . Cancer Father     possible gi   History  Substance Use Topics  . Smoking status: Never Smoker   .  Smokeless tobacco: Never Used  . Alcohol Use: No    Review of Systems  Gastrointestinal: Positive for abdominal pain, diarrhea and abdominal distention.  All other systems reviewed and are negative.     Allergies  Review of patient's allergies indicates no known allergies.  Home Medications   Prior to Admission medications   Medication Sig Start Date End Date Taking? Authorizing Provider  allopurinol (ZYLOPRIM) 100 MG tablet TAKE 1 TABLET BY MOUTH EVERY DAY 06/03/14   Lisabeth Pick, MD  AMBULATORY NON FORMULARY MEDICATION 1 Units by Does not apply route daily. Set CPAP to 12 07/18/14   Eustace Quail Tat, DO  aspirin 81 MG chewable tablet Chew 81 mg by mouth at bedtime.     Historical Provider, MD  carbidopa-levodopa (SINEMET CR) 50-200 MG per tablet Take 1 tablet by mouth 5 (five) times daily. 02/27/14   Rebecca S Tat, DO  CRESTOR 10 MG tablet TAKE 1 TABLET DAILY 09/22/14   Lisabeth Pick, MD  docusate sodium (COLACE) 100 MG capsule Take 100 mg by mouth 2 (two) times daily.      Historical Provider, MD  furosemide (LASIX) 40 MG tablet TAKE 1 TABLET BY MOUTH TWICE A DAY 10/13/14   Lisabeth Pick, MD  HYDROcodone-acetaminophen (NORCO) 10-325 MG per tablet TAKE 1 TABLET BY MOUTH TWICE A DAY AS  NEEDED FOR PAIN 10/01/14   Marin Olp, MD  KLOR-CON M20 20 MEQ tablet TAKE 1 TABLET TWICE A DAY    Lisabeth Pick, MD  lubiprostone (AMITIZA) 24 MCG capsule Take 1 capsule (24 mcg total) by mouth 2 (two) times daily with a meal. 08/19/14   Inda Castle, MD  Mirabegron ER (MYRBETRIQ) 25 MG TB24 Take 25 mg by mouth at bedtime.     Historical Provider, MD  MOVANTIK 25 MG TABS TAKE 1 TABLET BY MOUTH DAILY 09/29/14   Inda Castle, MD  nitrofurantoin, macrocrystal-monohydrate, (MACROBID) 100 MG capsule Take 100 mg by mouth at bedtime.  04/11/11   Historical Provider, MD  SANCTURA XR 60 MG CP24 Take 60 mg by mouth every morning.  01/28/11   Historical Provider, MD  selegiline (ELDEPRYL) 5 MG capsule TAKE  1 CAPSULE TWICE DAILY BEFORE MEALS 09/22/14   Eustace Quail Tat, DO  tamsulosin (FLOMAX) 0.4 MG CAPS capsule TAKE 1 CAPSULE DAILY 09/22/14   Lisabeth Pick, MD   BP 143/60 mmHg  Pulse 105  Temp(Src) 99.3 F (37.4 C) (Oral)  Resp 20  SpO2 93%   Physical Exam  Constitutional: He is oriented to person, place, and time. He appears well-developed and well-nourished.  HENT:  Head: Normocephalic and atraumatic.  Mouth/Throat: Oropharynx is clear and moist.  Eyes: Conjunctivae and EOM are normal. Pupils are equal, round, and reactive to light.  Neck: Normal range of motion.  Cardiovascular: Normal rate, regular rhythm and normal heart sounds.   Pulmonary/Chest: Effort normal and breath sounds normal. No respiratory distress. He has no wheezes. He has no rhonchi. He has no rales.  Abdominal: Soft. Bowel sounds are normal. He exhibits distension. There is generalized tenderness. There is no rigidity, no guarding, no CVA tenderness, no tenderness at McBurney's point and negative Murphy's sign.  Abdomen appears distended, overall soft; generalized tenderness without rebound or guarding  Genitourinary:  Loose light brown stool noted on glove no melena or hematochezia; no fecal impaction  Musculoskeletal: Normal range of motion.  Neurological: He is alert and oriented to person, place, and time.  Skin: Skin is warm and dry.  Psychiatric: He has a normal mood and affect.  Nursing note and vitals reviewed.   ED Course  Procedures (including critical care time) Labs Review Labs Reviewed  CBC WITH DIFFERENTIAL - Abnormal; Notable for the following:    Platelets 122 (*)    Neutrophils Relative % 93 (*)    Lymphocytes Relative 5 (*)    Lymphs Abs 0.2 (*)    Monocytes Relative 2 (*)    All other components within normal limits  COMPREHENSIVE METABOLIC PANEL - Abnormal; Notable for the following:    Glucose, Bld 193 (*)    Total Bilirubin 1.6 (*)    Anion gap 17 (*)    All other components within  normal limits  LIPASE, BLOOD - Abnormal; Notable for the following:    Lipase 9 (*)    All other components within normal limits  URINALYSIS, ROUTINE W REFLEX MICROSCOPIC - Abnormal; Notable for the following:    Color, Urine AMBER (*)    APPearance CLOUDY (*)    Leukocytes, UA MODERATE (*)    All other components within normal limits  URINE MICROSCOPIC-ADD ON - Abnormal; Notable for the following:    Squamous Epithelial / LPF FEW (*)    Casts HYALINE CASTS (*)    All other components within normal limits  I-STAT CG4 LACTIC ACID,  ED - Abnormal; Notable for the following:    Lactic Acid, Venous 2.45 (*)    All other components within normal limits    Imaging Review Ct Abdomen Pelvis W Contrast  10/24/2014   CLINICAL DATA:  Abdomen distention for 3 weeks getting worse, initial evaluation, constipation,  EXAM: CT ABDOMEN AND PELVIS WITH CONTRAST  TECHNIQUE: Multidetector CT imaging of the abdomen and pelvis was performed using the standard protocol following bolus administration of intravenous contrast.  CONTRAST:  96mL OMNIPAQUE IOHEXOL 300 MG/ML SOLN, 194mL OMNIPAQUE IOHEXOL 300 MG/ML SOLN  COMPARISON:  05/18/2009  FINDINGS: Mild intrahepatic and extrahepatic biliary dilatation appears to be likely related to prior cholecystectomy. Although mild this appears mildly progressive when compared to the prior study. Spleen is normal. Pancreas is normal. Common bile duct in the pancreatic head measures about 9 mm.  Adrenal glands are normal. Kidneys show no acute abnormalities. There is calcification of the abdominal aorta.  Bladder is markedly distended. There is dense material layering dependently along the posterior right wall bladder measuring 17 mm in length. Prostate is mildly prominent.  Stomach and small bowel are normal. Appendix has been removed. There is diverticulosis involving the entire colon, more prominent on the left colon. The rectum is distended with stool to a diameter of 10 cm.  Rectal wall is circumferentially thickened to 14 mm. Sigmoid colon is distended to 8.5 cm, and also contains a large volume of stool and also shows wall thickening to 17 mm. Left colon is markedly tortuous and redundant.No findings identified to suggest sigmoid volvulus.  IMPRESSION: The sigmoid colon and rectum are distended and demonstrate diffuse significant wall thickening, and contain a large volume of stool, without evidence of sigmoid volvulus. Findings indicate constipation, and a distal obstructing lesion at the level of the anus is not excluded. Neurogenic causes and laxative abuse are also possible considerations. The wall thickening of the sigmoid and rectum may reflect secondary engorgement and edema, but inflammation (colitis, ischemia, etc) is not excluded. There is no pneumatosis.  Bladder is significantly distended. There is a small volume of dense material presumably crystalline urine within the bladder.   Electronically Signed   By: Skipper Cliche M.D.   On: 10/24/2014 20:50     EKG Interpretation None      MDM   Final diagnoses:  Abdominal distention  Abdominal pain  Constipation, unspecified constipation type   80 y.o. M with ongoing abdominal pain x 3 weeks.  No nausea,vomiting, fever.  Did have some diarrhea earlier today.  On exam, patient afebrile and overall nontoxic in appearance. His abdomen does appear mildly distended and he has generalized tenderness. Rectal exam performed, no masses or fecal impaction noted.  No melena or hematochezia.  Will plan for labs and CT abdomen/pelvis for further evaluation. Patient given small fluid bolus, morphine.  Will monitor closely.    Labs as above. Urine culture pending.  CT with findings suggestive of constipation with questionable obstructing lesion at the anus.  Incidentally, bladder is also severely distended with dense material present in urine.  When talking with patient, states was due for barium enema 15 years ago (states  could not have colonoscopy due to "sharp turn" in his colon) however never had this done.  States some issues with constipation, once "cleaned out" he is able to urinate more freely.  Of note, patient also with known Parkinson's disease on chronic anti-cholinergics which may be contributing to this.  Feel patient would benefit from hospital admission  for further evaluation.  Case discussed with GI, Dr. Benson Norway, will evaluate tomorrow morning.  Will admit to hospitalist service, Dr. Alcario Drought, [place].  Temp admission orders placed, VS remain stable.  Larene Pickett, PA-C 10/25/14 0638  Blanchie Dessert, MD 10/25/14 [phone]

## 2014-10-24 NOTE — ED Notes (Signed)
Bed: WA03 Expected date:  Expected time:  Means of arrival:  Comments: ems 

## 2014-10-24 NOTE — H&P (Signed)
[facility] History and Physical  Walter Gill NMM:[id number] DOB: June 01, 1934 DOA: 10/24/2014  Referring physician: EDP PCP: Chancy Hurter, MD   Chief Complaint: Constipation   HPI: Walter Gill is a 80 y.o. male who presents with constipation and worsening abdominal distention for the past 3 weeks.  Symptoms have progressively worsened.  He has been having light brown diarrhea earlier this morning and continues to have some now.  No melena nor hematochezia.  Sigmoidoscopy was last performed back in the 90s but colonoscopy could not be performed due to inability to pass the scope due to the way his colon folds.  He last had symptoms of constipation like this in September of this year.  He was evaluated by GI at that time and they prescribed a Moviprep bowel prep to treat constipation.  This appeared to have mostly worked and "cleaned him out".  He also has urinary retention today and states that this happened with last episode and he was able to start going again after they cleaned him out last time.  Review of Systems: Systems reviewed.  As above, otherwise negative  Past Medical History  Diagnosis Date  . Sleep apnea   . RBBB (right bundle branch block)   . Parkinson disease   . Gout   . Hyperlipidemia   . Hypertension   . OA (osteoarthritis)   . PE (pulmonary embolism) september 2008  . DVT (deep venous thrombosis)   . Left ventricular dysfunction     impaired LV relaxation   Past Surgical History  Procedure Laterality Date  . Appendectomy    . Cholecystectomy    . Dupruytens contracture    . Total hip arthroplasty  9/8/8    right  . Pilonidal cyst removal    . Eye surgery      ? for lid lag vs apraxia of eye opening  . Cataract extraction     Social History:  reports that he has never smoked. He has never used smokeless tobacco. He reports that he does not drink alcohol or use illicit drugs.  No Known Allergies  Family History  Problem Relation Age  of Onset  . Heart disease Mother   . Cancer Father     possible gi     Prior to Admission medications   Medication Sig Start Date End Date Taking? Authorizing Provider  allopurinol (ZYLOPRIM) 100 MG tablet Take 100 mg by mouth daily.   Yes Historical Provider, MD  AMBULATORY NON FORMULARY MEDICATION 1 Units by Does not apply route daily. Set CPAP to 12 07/18/14  Yes Rebecca S Tat, DO  aspirin 81 MG chewable tablet Chew 81 mg by mouth at bedtime.    Yes Historical Provider, MD  carbidopa-levodopa (SINEMET CR) 50-200 MG per tablet Take 1 tablet by mouth 5 (five) times daily. 02/27/14  Yes Rebecca S Tat, DO  docusate sodium (COLACE) 100 MG capsule Take 100 mg by mouth 2 (two) times daily.     Yes Historical Provider, MD  furosemide (LASIX) 40 MG tablet Take 40 mg by mouth 2 (two) times daily.   Yes Historical Provider, MD  HYDROcodone-acetaminophen (NORCO) 10-325 MG per tablet Take 1 tablet by mouth 2 (two) times daily as needed for moderate pain.   Yes Historical Provider, MD  Mirabegron ER (MYRBETRIQ) 25 MG TB24 Take 25 mg by mouth at bedtime.    Yes Historical Provider, MD  Naloxegol Oxalate (MOVANTIK) 25 MG TABS Take 1 tablet by mouth daily.   Yes  Historical Provider, MD  nitrofurantoin, macrocrystal-monohydrate, (MACROBID) 100 MG capsule Take 100 mg by mouth at bedtime.  04/11/11  Yes Historical Provider, MD  potassium chloride SA (K-DUR,KLOR-CON) 20 MEQ tablet Take 20 mEq by mouth 2 (two) times daily.   Yes Historical Provider, MD  rosuvastatin (CRESTOR) 10 MG tablet Take 10 mg by mouth daily.   Yes Historical Provider, MD  SANCTURA XR 60 MG CP24 Take 60 mg by mouth every morning.  01/28/11  Yes Historical Provider, MD  selegiline (ELDEPRYL) 5 MG capsule Take 5 mg by mouth 2 (two) times daily.   Yes Historical Provider, MD  tamsulosin (FLOMAX) 0.4 MG CAPS capsule Take 0.4 mg by mouth at bedtime.   Yes Historical Provider, MD   Physical Exam: Filed Vitals:   10/24/14 2212  BP:   Pulse:    Temp: 98.6 F (37 C)  Resp:     BP 128/65 mmHg  Pulse 98  Temp(Src) 98.6 F (37 C) (Oral)  Resp 20  SpO2 91%  General Appearance:    Alert, oriented, no distress, appears stated age  Head:    Normocephalic, atraumatic  Eyes:    PERRL, EOMI, sclera non-icteric        Nose:   Nares without drainage or epistaxis. Mucosa, turbinates normal  Throat:   Moist mucous membranes. Oropharynx without erythema or exudate.  Neck:   Supple. No carotid bruits.  No thyromegaly.  No lymphadenopathy.   Back:     No CVA tenderness, no spinal tenderness  Lungs:     Clear to auscultation bilaterally, without wheezes, rhonchi or rales  Chest wall:    No tenderness to palpitation  Heart:    Regular rate and rhythm without murmurs, gallops, rubs  Abdomen:     Soft, non-tender, nondistended, normal bowel sounds, no organomegaly  Genitalia:    deferred  Rectal:    deferred  Extremities:   No clubbing, cyanosis or edema.  Pulses:   2+ and symmetric all extremities  Skin:   Skin color, texture, turgor normal, no rashes or lesions  Lymph nodes:   Cervical, supraclavicular, and axillary nodes normal  Neurologic:   CNII-XII intact. Normal strength, sensation and reflexes      throughout    Labs on Admission:  Basic Metabolic Panel:  Recent Labs Lab 10/24/14 1733  NA 139  K 3.8  CL 96  CO2 26  GLUCOSE 193*  BUN 21  CREATININE 0.63  CALCIUM 9.4   Liver Function Tests:  Recent Labs Lab 10/24/14 1733  AST 25  ALT <5  ALKPHOS 99  BILITOT 1.6*  PROT 7.6  ALBUMIN 3.8    Recent Labs Lab 10/24/14 1733  LIPASE 9*   No results for input(s): AMMONIA in the last 168 hours. CBC:  Recent Labs Lab 10/24/14 1733  WBC 4.6  NEUTROABS 4.3  HGB 14.4  HCT 43.1  MCV 83.7  PLT 122*   Cardiac Enzymes: No results for input(s): CKTOTAL, CKMB, CKMBINDEX, TROPONINI in the last 168 hours.  BNP (last 3 results) No results for input(s): PROBNP in the last 8760 hours. CBG: No results for  input(s): GLUCAP in the last 168 hours.  Radiological Exams on Admission: Ct Abdomen Pelvis W Contrast  10/24/2014   CLINICAL DATA:  Abdomen distention for 3 weeks getting worse, initial evaluation, constipation,  EXAM: CT ABDOMEN AND PELVIS WITH CONTRAST  TECHNIQUE: Multidetector CT imaging of the abdomen and pelvis was performed using the standard protocol following bolus administration of intravenous  contrast.  CONTRAST:  68mL OMNIPAQUE IOHEXOL 300 MG/ML SOLN, 157mL OMNIPAQUE IOHEXOL 300 MG/ML SOLN  COMPARISON:  05/18/2009  FINDINGS: Mild intrahepatic and extrahepatic biliary dilatation appears to be likely related to prior cholecystectomy. Although mild this appears mildly progressive when compared to the prior study. Spleen is normal. Pancreas is normal. Common bile duct in the pancreatic head measures about 9 mm.  Adrenal glands are normal. Kidneys show no acute abnormalities. There is calcification of the abdominal aorta.  Bladder is markedly distended. There is dense material layering dependently along the posterior right wall bladder measuring 17 mm in length. Prostate is mildly prominent.  Stomach and small bowel are normal. Appendix has been removed. There is diverticulosis involving the entire colon, more prominent on the left colon. The rectum is distended with stool to a diameter of 10 cm. Rectal wall is circumferentially thickened to 14 mm. Sigmoid colon is distended to 8.5 cm, and also contains a large volume of stool and also shows wall thickening to 17 mm. Left colon is markedly tortuous and redundant.No findings identified to suggest sigmoid volvulus.  IMPRESSION: The sigmoid colon and rectum are distended and demonstrate diffuse significant wall thickening, and contain a large volume of stool, without evidence of sigmoid volvulus. Findings indicate constipation, and a distal obstructing lesion at the level of the anus is not excluded. Neurogenic causes and laxative abuse are also possible  considerations. The wall thickening of the sigmoid and rectum may reflect secondary engorgement and edema, but inflammation (colitis, ischemia, etc) is not excluded. There is no pneumatosis.  Bladder is significantly distended. There is a small volume of dense material presumably crystalline urine within the bladder.   Electronically Signed   By: Skipper Cliche M.D.   On: 10/24/2014 20:50    EKG: Independently reviewed.  Assessment/Plan Principal Problem:   Constipation Active Problems:   OSA on CPAP   Chronic pain syndrome   1. Constipation - possibly with a partially obstructing lesion distal to stool in rectum but nothing found on rectal exam.  GI consulted they said we could get started with laxitive treatment, they will see in AM and decide on bowel prep / colonoscopy. 1. Given that what worked for him with identical symptoms 2 months ago was a Moviprep bowel prep, will go ahead and order golytely 4L.  Discussed this with patient and family and my reasoning behind this. 2. OSA on CPAP - continue home regimen 3. Chronic pain syndrome - continue home meds   Code Status: Full Code  Family Communication: Wife at bedside Disposition Plan: Admit to inpatient   Time spent: 68 min  GARDNER, JARED M. [facility] Pager [phone]  If 7AM-7PM, please contact the day team taking care of the patient [email or web address] Password [email or web address] 10/24/2014, 11:09 PM

## 2014-10-24 NOTE — ED Notes (Signed)
Pt reports abd distention for past 3 weeks that has progressively gotten worse. Pt reports constipation with small amount of brown stool earlier today. No N/v.

## 2014-10-25 ENCOUNTER — Encounter (HOSPITAL_COMMUNITY): Payer: Self-pay | Admitting: General Practice

## 2014-10-25 LAB — CBC
HEMATOCRIT: 41.4 % (ref 39.0–52.0)
HEMOGLOBIN: 13.5 g/dL (ref 13.0–17.0)
MCH: 27.3 pg (ref 26.0–34.0)
MCHC: 32.6 g/dL (ref 30.0–36.0)
MCV: 83.8 fL (ref 78.0–100.0)
Platelets: 133 10*3/uL — ABNORMAL LOW (ref 150–400)
RBC: 4.94 MIL/uL (ref 4.22–5.81)
RDW: 14.1 % (ref 11.5–15.5)
WBC: 8.4 10*3/uL (ref 4.0–10.5)

## 2014-10-25 LAB — BASIC METABOLIC PANEL
Anion gap: 14 (ref 5–15)
BUN: 21 mg/dL (ref 6–23)
CHLORIDE: 98 meq/L (ref 96–112)
CO2: 26 mEq/L (ref 19–32)
Calcium: 9 mg/dL (ref 8.4–10.5)
Creatinine, Ser: 0.78 mg/dL (ref 0.50–1.35)
GFR calc non Af Amer: 84 mL/min — ABNORMAL LOW (ref >90)
GLUCOSE: 151 mg/dL — AB (ref 70–99)
POTASSIUM: 3.2 meq/L — AB (ref 3.7–5.3)
Sodium: 138 mEq/L (ref 137–147)

## 2014-10-25 MED ORDER — POTASSIUM CHLORIDE CRYS ER 20 MEQ PO TBCR
40.0000 meq | EXTENDED_RELEASE_TABLET | Freq: Once | ORAL | Status: AC
  Administered 2014-10-25: 40 meq via ORAL
  Filled 2014-10-25: qty 2

## 2014-10-25 MED ORDER — GLYCERIN (LAXATIVE) 2.1 G RE SUPP
1.0000 | Freq: Two times a day (BID) | RECTAL | Status: DC
  Administered 2014-10-25 – 2014-10-29 (×4): 1 via RECTAL
  Filled 2014-10-25 (×10): qty 1

## 2014-10-25 NOTE — Progress Notes (Signed)
RT note:  Pt has refused use of cpap at this time.  RT will continue to monitor.

## 2014-10-25 NOTE — Consult Note (Signed)
Consult for [facility]  Reason for Consult: Constipation/Obstipation Referring Physician: [facility]  Sofie Hartigan HPI: This is a 80 year old male with a PMH of constipation, Parkinson's disease, HTN, hyperlipidemia, gout, OA, and sleep apnea admitted for worsening constipation with associated abdominal distension.  The symptoms were progressive.  Despite the constiaption he does report having some light brown diarrhea on the day of admission.  No reports of abdominal pain, nausea, vomiting, melena, or hematochezia.  In September this year he was managed by [facility] for his constipation with Linzess and MoviPrep.  This did help to provide bowel movements as PRN Miralax was not helping.  His current CT scan was positive for significant sigmoid and rectal distension as well as wall thickening.  This appears to be chronic.  Past Medical History  Diagnosis Date  . Sleep apnea   . RBBB (right bundle branch block)   . Parkinson disease   . Gout   . Hyperlipidemia   . Hypertension   . OA (osteoarthritis)   . PE (pulmonary embolism) september 2008  . DVT (deep venous thrombosis)   . Left ventricular dysfunction     impaired LV relaxation    Past Surgical History  Procedure Laterality Date  . Appendectomy    . Cholecystectomy    . Dupruytens contracture    . Total hip arthroplasty  9/8/8    right  . Pilonidal cyst removal    . Eye surgery      ? for lid lag vs apraxia of eye opening  . Cataract extraction      Family History  Problem Relation Age of Onset  . Heart disease Mother   . Cancer Father     possible gi    Social History:  reports that he has never smoked. He has never used smokeless tobacco. He reports that he does not drink alcohol or use illicit drugs.  Allergies: No Known Allergies  Medications:  Scheduled: . allopurinol  100 mg Oral Daily  . aspirin  81 mg Oral QHS  . carbidopa-levodopa  1 tablet Oral 5 X Daily  . darifenacin  15 mg Oral Daily  .  heparin  5,000 Units Subcutaneous 3 times per day  . mirabegron ER  25 mg Oral QHS  . Naloxegol Oxalate  25 mg Oral Daily  . nitrofurantoin (macrocrystal-monohydrate)  100 mg Oral QHS  . polyethylene glycol  4,000 mL Oral Once  . rosuvastatin  10 mg Oral q1800  . selegiline  5 mg Oral BID  . tamsulosin  0.4 mg Oral QHS   Continuous: . sodium chloride 75 mL/hr at 10/24/14 2332    Results for orders placed or performed during the hospital encounter of 10/24/14 (from the past 24 hour(s))  CBC with Differential     Status: Abnormal   Collection Time: 10/24/14  5:33 PM  Result Value Ref Range   WBC 4.6 4.0 - 10.5 K/uL   RBC 5.15 4.22 - 5.81 MIL/uL   Hemoglobin 14.4 13.0 - 17.0 g/dL   HCT 43.1 39.0 - 52.0 %   MCV 83.7 78.0 - 100.0 fL   MCH 28.0 26.0 - 34.0 pg   MCHC 33.4 30.0 - 36.0 g/dL   RDW 14.0 11.5 - 15.5 %   Platelets 122 (L) 150 - 400 K/uL   Neutrophils Relative % 93 (H) 43 - 77 %   Neutro Abs 4.3 1.7 - 7.7 K/uL   Lymphocytes Relative 5 (L) 12 - 46 %  Lymphs Abs 0.2 (L) 0.7 - 4.0 K/uL   Monocytes Relative 2 (L) 3 - 12 %   Monocytes Absolute 0.1 0.1 - 1.0 K/uL   Eosinophils Relative 0 0 - 5 %   Eosinophils Absolute 0.0 0.0 - 0.7 K/uL   Basophils Relative 0 0 - 1 %   Basophils Absolute 0.0 0.0 - 0.1 K/uL  Comprehensive metabolic panel     Status: Abnormal   Collection Time: 10/24/14  5:33 PM  Result Value Ref Range   Sodium 139 137 - 147 mEq/L   Potassium 3.8 3.7 - 5.3 mEq/L   Chloride 96 96 - 112 mEq/L   CO2 26 19 - 32 mEq/L   Glucose, Bld 193 (H) 70 - 99 mg/dL   BUN 21 6 - 23 mg/dL   Creatinine, Ser 0.63 0.50 - 1.35 mg/dL   Calcium 9.4 8.4 - 10.5 mg/dL   Total Protein 7.6 6.0 - 8.3 g/dL   Albumin 3.8 3.5 - 5.2 g/dL   AST 25 0 - 37 U/L   ALT <5 0 - 53 U/L   Alkaline Phosphatase 99 39 - 117 U/L   Total Bilirubin 1.6 (H) 0.3 - 1.2 mg/dL   GFR calc non Af Amer >90 >90 mL/min   GFR calc Af Amer >90 >90 mL/min   Anion gap 17 (H) 5 - 15  Lipase, blood     Status:  Abnormal   Collection Time: 10/24/14  5:33 PM  Result Value Ref Range   Lipase 9 (L) 11 - 59 U/L  I-Stat CG4 Lactic Acid, ED     Status: Abnormal   Collection Time: 10/24/14  5:42 PM  Result Value Ref Range   Lactic Acid, Venous 2.45 (H) 0.5 - 2.2 mmol/L  Urinalysis, Routine w reflex microscopic     Status: Abnormal   Collection Time: 10/24/14  7:18 PM  Result Value Ref Range   Color, Urine AMBER (A) YELLOW   APPearance CLOUDY (A) CLEAR   Specific Gravity, Urine 1.012 1.005 - 1.030   pH 6.0 5.0 - 8.0   Glucose, UA NEGATIVE NEGATIVE mg/dL   Hgb urine dipstick NEGATIVE NEGATIVE   Bilirubin Urine NEGATIVE NEGATIVE   Ketones, ur NEGATIVE NEGATIVE mg/dL   Protein, ur NEGATIVE NEGATIVE mg/dL   Urobilinogen, UA 1.0 0.0 - 1.0 mg/dL   Nitrite NEGATIVE NEGATIVE   Leukocytes, UA MODERATE (A) NEGATIVE  Urine microscopic-add on     Status: Abnormal   Collection Time: 10/24/14  7:18 PM  Result Value Ref Range   Squamous Epithelial / LPF FEW (A) RARE   WBC, UA 7-10 <3 WBC/hpf   Bacteria, UA RARE RARE   Casts HYALINE CASTS (A) NEGATIVE     Ct Abdomen Pelvis W Contrast  10/24/2014   CLINICAL DATA:  Abdomen distention for 3 weeks getting worse, initial evaluation, constipation,  EXAM: CT ABDOMEN AND PELVIS WITH CONTRAST  TECHNIQUE: Multidetector CT imaging of the abdomen and pelvis was performed using the standard protocol following bolus administration of intravenous contrast.  CONTRAST:  58mL OMNIPAQUE IOHEXOL 300 MG/ML SOLN, 178mL OMNIPAQUE IOHEXOL 300 MG/ML SOLN  COMPARISON:  05/18/2009  FINDINGS: Mild intrahepatic and extrahepatic biliary dilatation appears to be likely related to prior cholecystectomy. Although mild this appears mildly progressive when compared to the prior study. Spleen is normal. Pancreas is normal. Common bile duct in the pancreatic head measures about 9 mm.  Adrenal glands are normal. Kidneys show no acute abnormalities. There is calcification of the abdominal aorta.  Bladder is markedly distended. There is dense material layering dependently along the posterior right wall bladder measuring 17 mm in length. Prostate is mildly prominent.  Stomach and small bowel are normal. Appendix has been removed. There is diverticulosis involving the entire colon, more prominent on the left colon. The rectum is distended with stool to a diameter of 10 cm. Rectal wall is circumferentially thickened to 14 mm. Sigmoid colon is distended to 8.5 cm, and also contains a large volume of stool and also shows wall thickening to 17 mm. Left colon is markedly tortuous and redundant.No findings identified to suggest sigmoid volvulus.  IMPRESSION: The sigmoid colon and rectum are distended and demonstrate diffuse significant wall thickening, and contain a large volume of stool, without evidence of sigmoid volvulus. Findings indicate constipation, and a distal obstructing lesion at the level of the anus is not excluded. Neurogenic causes and laxative abuse are also possible considerations. The wall thickening of the sigmoid and rectum may reflect secondary engorgement and edema, but inflammation (colitis, ischemia, etc) is not excluded. There is no pneumatosis.  Bladder is significantly distended. There is a small volume of dense material presumably crystalline urine within the bladder.   Electronically Signed   By: Skipper Cliche M.D.   On: 10/24/2014 20:50    ROS:  As stated above in the HPI otherwise negative.  Blood pressure 134/58, pulse 88, temperature 100.2 F (37.9 C), temperature source Oral, resp. rate 20, height 5' 7.5" (1.715 m), weight 108.001 kg (238 lb 1.6 oz), SpO2 97 %.    PE: Gen: NAD, uncomfortable appearing, Alert and Oriented HEENT:  [place]/AT, EOMI Neck: Supple, no LAD Lungs: CTA Bilaterally CV: RRR without M/G/R ABM: Soft, NTND, +BS Ext: No C/C/E Rectal: Moderately firm stool in the rectal vault.  Assessment/Plan: 1) Constipation/Fecal Impaction/Overflow  incontinence. 2) Parkinson's disease. 3) Abnormal CT scan.   The patient appears to have fecal impaction with overflow incontinence.  This can be secondary to his Parkinson's disease.  He is currently receiving Golytely, but I think he will also benefit with enemas/suppositories.  In the past he was provided with Fleets enemas.  I will avoid that at this time even though his renal function is normal.  Glycerine suppositories will benefit by stimulating colonic contractions.  Plan: 1) Continue with Golytely. 2) Glycerin suppositories.  Kolbee Stallman D 10/25/2014, 5:53 AM

## 2014-10-25 NOTE — Progress Notes (Signed)
[facility] PROGRESS NOTE  Walter Gill WEX:[id number] DOB: 01/03/1934 DOA: 10/24/2014 PCP: Chancy Hurter, MD  Assessment/Plan: 1. Fecal impaction with overflow incontinence/ constipation: On golytely. Hasd 2 bowel movements already. Denies any nausea or vomiting or abdominal pain.  Gastroenterology consulted and recommendations given.   Hypokalemia replete as needed.   DVT prophylaxis.   Code Status: full code Family Communication: none at bedside Disposition Plan: pending.    Consultants:  gastroenterology  Procedures:  none  Antibiotics:  none  HPI/Subjective: Better than yesterday.   Objective: Filed Vitals:   10/25/14 0528  BP: 134/58  Pulse: 88  Temp: 100.2 F (37.9 C)  Resp: 20    Intake/Output Summary (Last 24 hours) at 10/25/14 1150 Last data filed at 10/25/14 0954  Gross per 24 hour  Intake    560 ml  Output   1875 ml  Net  -1315 ml   Filed Weights   10/24/14 2315  Weight: 108.001 kg (238 lb 1.6 oz)    Exam:   General:  Alert afebrile comfortable  Cardiovascular: s1s2  Respiratory: ctab  Abdomen: soft non tender non distended bowel sounds heard.   Musculoskeletal: no pedal edema.   Data Reviewed: Basic Metabolic Panel:  Recent Labs Lab 10/24/14 1733 10/25/14 0505  NA 139 138  K 3.8 3.2*  CL 96 98  CO2 26 26  GLUCOSE 193* 151*  BUN 21 21  CREATININE 0.63 0.78  CALCIUM 9.4 9.0   Liver Function Tests:  Recent Labs Lab 10/24/14 1733  AST 25  ALT <5  ALKPHOS 99  BILITOT 1.6*  PROT 7.6  ALBUMIN 3.8    Recent Labs Lab 10/24/14 1733  LIPASE 9*   No results for input(s): AMMONIA in the last 168 hours. CBC:  Recent Labs Lab 10/24/14 1733 10/25/14 0505  WBC 4.6 8.4  NEUTROABS 4.3  --   HGB 14.4 13.5  HCT 43.1 41.4  MCV 83.7 83.8  PLT 122* 133*   Cardiac Enzymes: No results for input(s): CKTOTAL, CKMB, CKMBINDEX, TROPONINI in the last 168 hours. BNP (last 3 results) No results for  input(s): PROBNP in the last 8760 hours. CBG: No results for input(s): GLUCAP in the last 168 hours.  No results found for this or any previous visit (from the past 240 hour(s)).   Studies: Ct Abdomen Pelvis W Contrast  10/24/2014   CLINICAL DATA:  Abdomen distention for 3 weeks getting worse, initial evaluation, constipation,  EXAM: CT ABDOMEN AND PELVIS WITH CONTRAST  TECHNIQUE: Multidetector CT imaging of the abdomen and pelvis was performed using the standard protocol following bolus administration of intravenous contrast.  CONTRAST:  41mL OMNIPAQUE IOHEXOL 300 MG/ML SOLN, 137mL OMNIPAQUE IOHEXOL 300 MG/ML SOLN  COMPARISON:  05/18/2009  FINDINGS: Mild intrahepatic and extrahepatic biliary dilatation appears to be likely related to prior cholecystectomy. Although mild this appears mildly progressive when compared to the prior study. Spleen is normal. Pancreas is normal. Common bile duct in the pancreatic head measures about 9 mm.  Adrenal glands are normal. Kidneys show no acute abnormalities. There is calcification of the abdominal aorta.  Bladder is markedly distended. There is dense material layering dependently along the posterior right wall bladder measuring 17 mm in length. Prostate is mildly prominent.  Stomach and small bowel are normal. Appendix has been removed. There is diverticulosis involving the entire colon, more prominent on the left colon. The rectum is distended with stool to a diameter of 10 cm. Rectal wall is circumferentially thickened to 14  mm. Sigmoid colon is distended to 8.5 cm, and also contains a large volume of stool and also shows wall thickening to 17 mm. Left colon is markedly tortuous and redundant.No findings identified to suggest sigmoid volvulus.  IMPRESSION: The sigmoid colon and rectum are distended and demonstrate diffuse significant wall thickening, and contain a large volume of stool, without evidence of sigmoid volvulus. Findings indicate constipation, and a distal  obstructing lesion at the level of the anus is not excluded. Neurogenic causes and laxative abuse are also possible considerations. The wall thickening of the sigmoid and rectum may reflect secondary engorgement and edema, but inflammation (colitis, ischemia, etc) is not excluded. There is no pneumatosis.  Bladder is significantly distended. There is a small volume of dense material presumably crystalline urine within the bladder.   Electronically Signed   By: Skipper Cliche M.D.   On: 10/24/2014 20:50    Scheduled Meds: . allopurinol  100 mg Oral Daily  . aspirin  81 mg Oral QHS  . carbidopa-levodopa  1 tablet Oral 5 X Daily  . darifenacin  15 mg Oral Daily  . Glycerin (Adult)  1 suppository Rectal BID  . heparin  5,000 Units Subcutaneous 3 times per day  . mirabegron ER  25 mg Oral QHS  . Naloxegol Oxalate  25 mg Oral Daily  . nitrofurantoin (macrocrystal-monohydrate)  100 mg Oral QHS  . rosuvastatin  10 mg Oral q1800  . selegiline  5 mg Oral BID  . tamsulosin  0.4 mg Oral QHS   Continuous Infusions: . sodium chloride 75 mL/hr at 10/24/14 2332    Principal Problem:   Constipation Active Problems:   OSA on CPAP   Chronic pain syndrome    Time spent: 20 minutes    [facility] Pager [phone]. If 7PM-7AM, please contact night-coverage at [email or web address], password [facility] 10/25/2014, 11:50 AM  LOS: 1 day

## 2014-10-26 LAB — BASIC METABOLIC PANEL
ANION GAP: 12 (ref 5–15)
BUN: 15 mg/dL (ref 6–23)
CO2: 26 meq/L (ref 19–32)
CREATININE: 0.69 mg/dL (ref 0.50–1.35)
Calcium: 8.8 mg/dL (ref 8.4–10.5)
Chloride: 102 mEq/L (ref 96–112)
GFR calc Af Amer: >90 mL/min (ref >90)
GFR calc non Af Amer: 89 mL/min — ABNORMAL LOW (ref >90)
Glucose, Bld: 142 mg/dL — ABNORMAL HIGH (ref 70–99)
Potassium: 3.2 mEq/L — ABNORMAL LOW (ref 3.7–5.3)
Sodium: 140 mEq/L (ref 137–147)

## 2014-10-26 MED ORDER — POTASSIUM CHLORIDE CRYS ER 20 MEQ PO TBCR
40.0000 meq | EXTENDED_RELEASE_TABLET | Freq: Three times a day (TID) | ORAL | Status: AC
  Administered 2014-10-26 (×3): 40 meq via ORAL
  Filled 2014-10-26 (×4): qty 2

## 2014-10-26 MED ORDER — CALCIUM CARBONATE ANTACID 500 MG PO CHEW
500.0000 mg | CHEWABLE_TABLET | Freq: Four times a day (QID) | ORAL | Status: DC | PRN
  Administered 2014-10-26 – 2014-10-27 (×2): 500 mg via ORAL
  Filled 2014-10-26 (×3): qty 1

## 2014-10-26 MED ORDER — CALCIUM CARBONATE ANTACID 500 MG PO CHEW: 1.0000 | CHEWABLE_TABLET | Freq: Four times a day (QID) | ORAL | Status: DC | PRN

## 2014-10-26 NOTE — Progress Notes (Signed)
RT note:  Pt has refused cpap, but knows he can get it if he changes his mind.

## 2014-10-26 NOTE — Progress Notes (Signed)
[facility] PROGRESS NOTE  Walter Gill AJO:[id number] DOB: 03-28-1934 DOA: 10/24/2014 PCP: Chancy Hurter, MD  Assessment/Plan: 1. Fecal impaction with overflow incontinence/ constipation: On golytely. Had 3 good Bm today. Denies any nausea or vomiting or abdominal pain.  Gastroenterology consulted and recommendations given. Will advance diet and watch him. Physical therapy evalaution ordered.   Hypokalemia replete as needed.   DVT prophylaxis.   Code Status: full code Family Communication: none at bedside Disposition Plan: pending.    Consultants:  gastroenterology  Procedures:  none  Antibiotics:  none  HPI/Subjective: Better than yesterday. 3 bm today. No nausea or vomiting.   Objective: Filed Vitals:   10/26/14 0516  BP: 152/70  Pulse: 85  Temp: 98.4 F (36.9 C)  Resp: 18    Intake/Output Summary (Last 24 hours) at 10/26/14 1312 Last data filed at 10/26/14 0517  Gross per 24 hour  Intake   1260 ml  Output    925 ml  Net    335 ml   Filed Weights   10/24/14 2315  Weight: 108.001 kg (238 lb 1.6 oz)    Exam:   General:  Alert afebrile comfortable  Cardiovascular: s1s2  Respiratory: ctab  Abdomen: soft non tender non distended bowel sounds heard.   Musculoskeletal: no pedal edema.   Data Reviewed: Basic Metabolic Panel:  Recent Labs Lab 10/24/14 1733 10/25/14 0505 10/26/14 0525  NA 139 138 140  K 3.8 3.2* 3.2*  CL 96 98 102  CO2 26 26 26   GLUCOSE 193* 151* 142*  BUN 21 21 15   CREATININE 0.63 0.78 0.69  CALCIUM 9.4 9.0 8.8   Liver Function Tests:  Recent Labs Lab 10/24/14 1733  AST 25  ALT <5  ALKPHOS 99  BILITOT 1.6*  PROT 7.6  ALBUMIN 3.8    Recent Labs Lab 10/24/14 1733  LIPASE 9*   No results for input(s): AMMONIA in the last 168 hours. CBC:  Recent Labs Lab 10/24/14 1733 10/25/14 0505  WBC 4.6 8.4  NEUTROABS 4.3  --   HGB 14.4 13.5  HCT 43.1 41.4  MCV 83.7 83.8  PLT 122* 133*    Cardiac Enzymes: No results for input(s): CKTOTAL, CKMB, CKMBINDEX, TROPONINI in the last 168 hours. BNP (last 3 results) No results for input(s): PROBNP in the last 8760 hours. CBG: No results for input(s): GLUCAP in the last 168 hours.  No results found for this or any previous visit (from the past 240 hour(s)).   Studies: Ct Abdomen Pelvis W Contrast  10/24/2014   CLINICAL DATA:  Abdomen distention for 3 weeks getting worse, initial evaluation, constipation,  EXAM: CT ABDOMEN AND PELVIS WITH CONTRAST  TECHNIQUE: Multidetector CT imaging of the abdomen and pelvis was performed using the standard protocol following bolus administration of intravenous contrast.  CONTRAST:  35mL OMNIPAQUE IOHEXOL 300 MG/ML SOLN, 122mL OMNIPAQUE IOHEXOL 300 MG/ML SOLN  COMPARISON:  05/18/2009  FINDINGS: Mild intrahepatic and extrahepatic biliary dilatation appears to be likely related to prior cholecystectomy. Although mild this appears mildly progressive when compared to the prior study. Spleen is normal. Pancreas is normal. Common bile duct in the pancreatic head measures about 9 mm.  Adrenal glands are normal. Kidneys show no acute abnormalities. There is calcification of the abdominal aorta.  Bladder is markedly distended. There is dense material layering dependently along the posterior right wall bladder measuring 17 mm in length. Prostate is mildly prominent.  Stomach and small bowel are normal. Appendix has been removed. There is diverticulosis  involving the entire colon, more prominent on the left colon. The rectum is distended with stool to a diameter of 10 cm. Rectal wall is circumferentially thickened to 14 mm. Sigmoid colon is distended to 8.5 cm, and also contains a large volume of stool and also shows wall thickening to 17 mm. Left colon is markedly tortuous and redundant.No findings identified to suggest sigmoid volvulus.  IMPRESSION: The sigmoid colon and rectum are distended and demonstrate diffuse  significant wall thickening, and contain a large volume of stool, without evidence of sigmoid volvulus. Findings indicate constipation, and a distal obstructing lesion at the level of the anus is not excluded. Neurogenic causes and laxative abuse are also possible considerations. The wall thickening of the sigmoid and rectum may reflect secondary engorgement and edema, but inflammation (colitis, ischemia, etc) is not excluded. There is no pneumatosis.  Bladder is significantly distended. There is a small volume of dense material presumably crystalline urine within the bladder.   Electronically Signed   By: Skipper Cliche M.D.   On: 10/24/2014 20:50    Scheduled Meds: . allopurinol  100 mg Oral Daily  . aspirin  81 mg Oral QHS  . carbidopa-levodopa  1 tablet Oral 5 X Daily  . darifenacin  15 mg Oral Daily  . Glycerin (Adult)  1 suppository Rectal BID  . heparin  5,000 Units Subcutaneous 3 times per day  . mirabegron ER  25 mg Oral QHS  . Naloxegol Oxalate  25 mg Oral Daily  . nitrofurantoin (macrocrystal-monohydrate)  100 mg Oral QHS  . potassium chloride  40 mEq Oral TID  . rosuvastatin  10 mg Oral q1800  . selegiline  5 mg Oral BID  . tamsulosin  0.4 mg Oral QHS   Continuous Infusions:    Principal Problem:   Constipation Active Problems:   OSA on CPAP   Chronic pain syndrome    Time spent: 20 minutes    [facility] Pager [phone]. If 7PM-7AM, please contact night-coverage at [email or web address], password [facility] 10/26/2014, 1:12 PM  LOS: 2 days

## 2014-10-26 NOTE — Progress Notes (Signed)
Subjective: He had a total of 3 very good bowel movements.  Feeling better, but he feels that Nulytely caused him to have GERD.  Objective: Vital signs in last 24 hours: Temp:  [97.8 F (36.6 C)-98.6 F (37 C)] 98.4 F (36.9 C) (11/15 0516) Pulse Rate:  [81-88] 85 (11/15 0516) Resp:  [18] 18 (11/15 0516) BP: (129-152)/(57-70) 152/70 mmHg (11/15 0516) SpO2:  [96 %-99 %] 99 % (11/15 0516) Last BM Date: 10/25/14  Intake/Output from previous day: 11/14 0701 - 11/15 0700 In: 1980 [P.O.:780; I.V.:1200] Out: 1200 [Urine:1200] Intake/Output this shift: Total I/O In: 697.5 [I.V.:697.5] Out: 550 [Urine:550]  General appearance: alert and no distress GI: soft, non-tender; bowel sounds normal; no masses,  no organomegaly  Lab Results:  Recent Labs  10/24/14 1733 10/25/14 0505  WBC 4.6 8.4  HGB 14.4 13.5  HCT 43.1 41.4  PLT 122* 133*   BMET  Recent Labs  10/24/14 1733 10/25/14 0505 10/26/14 0525  NA 139 138 140  K 3.8 3.2* 3.2*  CL 96 98 102  CO2 26 26 26   GLUCOSE 193* 151* 142*  BUN 21 21 15   CREATININE 0.63 0.78 0.69  CALCIUM 9.4 9.0 8.8   LFT  Recent Labs  10/24/14 1733  PROT 7.6  ALBUMIN 3.8  AST 25  ALT <5  ALKPHOS 99  BILITOT 1.6*   PT/INR No results for input(s): LABPROT, INR in the last 72 hours. Hepatitis Panel No results for input(s): HEPBSAG, HCVAB, HEPAIGM, HEPBIGM in the last 72 hours. C-Diff No results for input(s): CDIFFTOX in the last 72 hours. Fecal Lactopherrin No results for input(s): FECLLACTOFRN in the last 72 hours.  Studies/Results: Ct Abdomen Pelvis W Contrast  10/24/2014   CLINICAL DATA:  Abdomen distention for 3 weeks getting worse, initial evaluation, constipation,  EXAM: CT ABDOMEN AND PELVIS WITH CONTRAST  TECHNIQUE: Multidetector CT imaging of the abdomen and pelvis was performed using the standard protocol following bolus administration of intravenous contrast.  CONTRAST:  43mL OMNIPAQUE IOHEXOL 300 MG/ML SOLN, 14mL  OMNIPAQUE IOHEXOL 300 MG/ML SOLN  COMPARISON:  05/18/2009  FINDINGS: Mild intrahepatic and extrahepatic biliary dilatation appears to be likely related to prior cholecystectomy. Although mild this appears mildly progressive when compared to the prior study. Spleen is normal. Pancreas is normal. Common bile duct in the pancreatic head measures about 9 mm.  Adrenal glands are normal. Kidneys show no acute abnormalities. There is calcification of the abdominal aorta.  Bladder is markedly distended. There is dense material layering dependently along the posterior right wall bladder measuring 17 mm in length. Prostate is mildly prominent.  Stomach and small bowel are normal. Appendix has been removed. There is diverticulosis involving the entire colon, more prominent on the left colon. The rectum is distended with stool to a diameter of 10 cm. Rectal wall is circumferentially thickened to 14 mm. Sigmoid colon is distended to 8.5 cm, and also contains a large volume of stool and also shows wall thickening to 17 mm. Left colon is markedly tortuous and redundant.No findings identified to suggest sigmoid volvulus.  IMPRESSION: The sigmoid colon and rectum are distended and demonstrate diffuse significant wall thickening, and contain a large volume of stool, without evidence of sigmoid volvulus. Findings indicate constipation, and a distal obstructing lesion at the level of the anus is not excluded. Neurogenic causes and laxative abuse are also possible considerations. The wall thickening of the sigmoid and rectum may reflect secondary engorgement and edema, but inflammation (colitis, ischemia, etc) is not  excluded. There is no pneumatosis.  Bladder is significantly distended. There is a small volume of dense material presumably crystalline urine within the bladder.   Electronically Signed   By: Skipper Cliche M.D.   On: 10/24/2014 20:50    Medications:  Scheduled: . allopurinol  100 mg Oral Daily  . aspirin  81 mg  Oral QHS  . carbidopa-levodopa  1 tablet Oral 5 X Daily  . darifenacin  15 mg Oral Daily  . Glycerin (Adult)  1 suppository Rectal BID  . heparin  5,000 Units Subcutaneous 3 times per day  . mirabegron ER  25 mg Oral QHS  . Naloxegol Oxalate  25 mg Oral Daily  . nitrofurantoin (macrocrystal-monohydrate)  100 mg Oral QHS  . rosuvastatin  10 mg Oral q1800  . selegiline  5 mg Oral BID  . tamsulosin  0.4 mg Oral QHS   Continuous: . sodium chloride 75 mL/hr at 10/26/14 0451    Assessment/Plan: 1) Fecal impaction. 2) Constipation.  3) Parkinson's Disease.   He is much better today.  On a routine basis he will require intermittent clean out.  He was placed on MoviPrep as an outpatient, but he states that he was only taking smaller portions.  I think taking larger portions intermittently will to prevent a fecal impaction will be beneficial.  Plan: 1) Okay to transfer to chair. 2) Continue with Nulytely for now. 3) Continue with glycerin suppositories. 4) He will need outpatient follow up with [facility].  LOS: 2 days   Eveleen Mcnear D 10/26/2014, 6:33 AM

## 2014-10-27 ENCOUNTER — Inpatient Hospital Stay (HOSPITAL_COMMUNITY): Payer: Medicare Other

## 2014-10-27 ENCOUNTER — Ambulatory Visit: Payer: Medicare Other

## 2014-10-27 ENCOUNTER — Telehealth: Payer: Self-pay | Admitting: Neurology

## 2014-10-27 DIAGNOSIS — K5649 Other impaction of intestine: Secondary | ICD-10-CM | POA: Diagnosis present

## 2014-10-27 DIAGNOSIS — K5900 Constipation, unspecified: Secondary | ICD-10-CM | POA: Diagnosis present

## 2014-10-27 LAB — BASIC METABOLIC PANEL
Anion gap: 12 (ref 5–15)
BUN: 14 mg/dL (ref 6–23)
CALCIUM: 8.7 mg/dL (ref 8.4–10.5)
CO2: 23 meq/L (ref 19–32)
CREATININE: 0.72 mg/dL (ref 0.50–1.35)
Chloride: 103 mEq/L (ref 96–112)
GFR calc Af Amer: >90 mL/min (ref >90)
GFR calc non Af Amer: 87 mL/min — ABNORMAL LOW (ref >90)
Glucose, Bld: 123 mg/dL — ABNORMAL HIGH (ref 70–99)
Potassium: 3.9 mEq/L (ref 3.7–5.3)
Sodium: 138 mEq/L (ref 137–147)

## 2014-10-27 LAB — URINE CULTURE

## 2014-10-27 LAB — BLOOD GAS, ARTERIAL
Acid-Base Excess: 1 mmol/L (ref 0.0–2.0)
BICARBONATE: 23.6 meq/L (ref 20.0–24.0)
Drawn by: 235321
FIO2: 0.21 %
O2 SAT: 94.4 %
PCO2 ART: 34.1 mmHg — AB (ref 35.0–45.0)
Patient temperature: 100.4
TCO2: 21 mmol/L (ref 0–100)
pH, Arterial: 7.46 — ABNORMAL HIGH (ref 7.350–7.450)
pO2, Arterial: 80.9 mmHg (ref 80.0–100.0)

## 2014-10-27 LAB — GLUCOSE, CAPILLARY: GLUCOSE-CAPILLARY: 103 mg/dL — AB (ref 70–99)

## 2014-10-27 MED ORDER — METOCLOPRAMIDE HCL 5 MG/ML IJ SOLN
10.0000 mg | Freq: Three times a day (TID) | INTRAMUSCULAR | Status: DC
  Administered 2014-10-27: 10 mg via INTRAVENOUS
  Filled 2014-10-27 (×4): qty 2

## 2014-10-27 MED ORDER — NALOXONE HCL 0.4 MG/ML IJ SOLN
INTRAMUSCULAR | Status: AC
  Administered 2014-10-27: 0.4 mg
  Filled 2014-10-27: qty 1

## 2014-10-27 MED ORDER — FLEET ENEMA 7-19 GM/118ML RE ENEM
1.0000 | ENEMA | Freq: Once | RECTAL | Status: AC
  Administered 2014-10-27: 1 via RECTAL
  Filled 2014-10-27: qty 1

## 2014-10-27 MED ORDER — SALINE SPRAY 0.65 % NA SOLN
1.0000 | NASAL | Status: DC | PRN
  Filled 2014-10-27: qty 44

## 2014-10-27 MED ORDER — MAGNESIUM CITRATE PO SOLN
1.0000 | Freq: Once | ORAL | Status: AC
  Administered 2014-10-27: 1 via ORAL

## 2014-10-27 MED ORDER — NALOXONE HCL 0.4 MG/ML IJ SOLN: 0.4000 mg | INTRAMUSCULAR | Status: DC | PRN

## 2014-10-27 NOTE — Progress Notes (Addendum)
   Subjective  No stools since last night, feeling full   Objective   Constipation, had several stools during the day but still feeling distended, suppositories not working, Vital signs in last 24 hours: Temp:  [98.2 F (36.8 C)-98.6 F (37 C)] 98.3 F (36.8 C) (11/16 0519) Pulse Rate:  [71-77] 71 (11/16 0519) Resp:  [18] 18 (11/16 0519) BP: (138-170)/(59-75) 170/75 mmHg (11/16 0519) SpO2:  [95 %-96 %] 95 % (11/16 0519) Last BM Date: 10/26/14 General:    white male in NAD Heart:  Regular rate and rhythm; no murmurs Lungs: Resp normal, no wheezes. Abdomen: distended, obedse, soft,  Increased tympany, ,no fluid wave, soft B.S./s Extremities:  Without edema. Neurologic:  Alert and oriented,  grossly normal neurologically. Psych:  Cooperative. Normal mood and affect.  Intake/Output from previous day: 11/15 0701 - 11/16 0700 In: 120 [P.O.:120] Out: 1300 [Urine:1300] Intake/Output this shift:    Lab Results:  Recent Labs  10/24/14 1733 10/25/14 0505  WBC 4.6 8.4  HGB 14.4 13.5  HCT 43.1 41.4  PLT 122* 133*   BMET  Recent Labs  10/24/14 1733 10/25/14 0505 10/26/14 0525  NA 139 138 140  K 3.8 3.2* 3.2*  CL 96 98 102  CO2 26 26 26   GLUCOSE 193* 151* 142*  BUN 21 21 15   CREATININE 0.63 0.78 0.69  CALCIUM 9.4 9.0 8.8   LFT  Recent Labs  10/24/14 1733  PROT 7.6  ALBUMIN 3.8  AST 25  ALT <5  ALKPHOS 99  BILITOT 1.6*   PT/INR No results for input(s): LABPROT, INR in the last 72 hours.  Studies/Results: No results found.     Assessment / Plan:   Continued constipation, will check KUB Fleets enema x2 Mag citrate 1 bottle today, follow K+  Principal Problem:   Constipation Active Problems:   OSA on CPAP   Chronic pain syndrome     LOS: 3 days   Delfin Edis  10/27/2014, 8:48 AM  Addendum: KUB shows gastric distention, but colon  Has been cleaned- no excess of stool. Diet advanced, Cosider Reglan IV to facilitate gastric emptying.

## 2014-10-27 NOTE — Telephone Encounter (Signed)
Maryanne, pt's wife called to cancel his B-12 shot. Pt will call later to r/s. [phone]

## 2014-10-27 NOTE — Progress Notes (Signed)
CARE MANAGEMENT NOTE 10/27/2014  Patient:  Walter Gill, Walter Gill   Account Number:  [id number]  Date Initiated:  10/27/2014  Documentation initiated by:  Edwyna Shell  Subjective/Objective Assessment:   80 yo male admitted with constipation with hx of chronic pain, from home with his wife, has PCP,pharmacy and no transportation needs     Action/Plan:   discharge planning   Anticipated DC Date:  10/28/2014   Anticipated DC Plan:  [place]  In-house referral  Clinical Social Worker      DC Planning Services  CM consult      Choice offered to / List presented to:             Status of service:  In process, will continue to follow Medicare Important Message given?  YES (If response is "NO", the following Medicare IM given date fields will be blank) Date Medicare IM given:  10/27/2014 Medicare IM given by:  Edwyna Shell Date Additional Medicare IM given:   Additional Medicare IM given by:    Discharge Disposition:  [place]  Per UR Regulation:    If discussed at Long Length of Stay Meetings, dates discussed:    Comments:  10/27/14 Edwyna Shell RN, BSN, CM Spoke with patient and his spouse and they stated that they have use [facility] services in the past but are looking at [facility] for ST rehab after discharge.

## 2014-10-27 NOTE — Evaluation (Signed)
Physical Therapy Evaluation Patient Details Name: Walter Gill MRN: [id number] DOB: [birth year] Today's Date: 10/27/2014   History of Present Illness  Walter Gill is a 80 y.o. male who presents with constipation and worsening abdominal distention for the past 3 weeks.   Clinical Impression  *Pt admitted with constipation**. Pt currently with functional limitations due to the deficits listed below (see PT Problem List).  Pt will benefit from skilled PT to increase their independence and safety with mobility to allow discharge to the venue listed below.   Mod assist for supine to sit and to pivot to bedside commode. Activity limited due to frequent urges to have BM, unable to assess gait for this reason. He will need SNF if still requiring assist upon DC as his wife is physically unable to assist (she had a failed total shoulder replacement and can't lift).   **    Follow Up Recommendations SNF    Equipment Recommendations  None recommended by PT    Recommendations for Other Services       Precautions / Restrictions Precautions Precautions: Fall Precaution Comments: 2 falls in past 2 weeks, one was due to socks slipping on bathroom floor, other was due to rushing to toilet Restrictions Weight Bearing Restrictions: No      Mobility  Bed Mobility Overal bed mobility: Needs Assistance Bed Mobility: Supine to Sit     Supine to sit: Mod assist     General bed mobility comments: assist to raise trunk, cues for technique, attempted to roll but difficult due to distended abdomen, HOB elevated  Transfers Overall transfer level: Needs assistance Equipment used: Rolling walker (2 wheeled) Transfers: Sit to/from [facility] Sit to Stand: +2 safety/equipment;Mod assist;From elevated surface Stand pivot transfers: +2 safety/equipment;Mod assist       General transfer comment: assist to rise from elevated bed and from [facility] using armrests  Ambulation/Gait                 Stairs            Wheelchair Mobility    Modified Rankin (Stroke Patients Only)       Balance Overall balance assessment: Needs assistance Sitting-balance support: Bilateral upper extremity supported;Feet supported Sitting balance-Leahy Scale: Poor Sitting balance - Comments: requires UE support in sitting Postural control: Right lateral lean Standing balance support: Bilateral upper extremity supported Standing balance-Leahy Scale: Poor Standing balance comment: requires BUE support in standing                             Pertinent Vitals/Pain Pain Assessment: 0-10 Pain Score: 6  Pain Location: abdomen Pain Descriptors / Indicators: Tightness Pain Intervention(s): Limited activity within patient's tolerance;Monitored during session    Home Living Family/patient expects to be discharged to:: Private residence Living Arrangements: Spouse/significant other Available Help at Discharge: Family;Available 24 hours/day (wife can't lift due to shoulder weakness) Type of Home: House Home Access: Stairs to enter Entrance Stairs-Rails: None Entrance Stairs-Number of Steps: 2 -1 platform, then 1 step   Home Equipment: Walker - 2 wheels;Walker - 4 wheels;Shower seat (handicapped height commode)      Prior Function Level of Independence: Independent with assistive device(s)         Comments: I with ADLs, uses RW, plays drums in 50s-60s rock band     Hand Dominance        Extremity/Trunk Assessment   Upper Extremity Assessment: Overall WFL for tasks  assessed           Lower Extremity Assessment: Overall WFL for tasks assessed (sensation intact to light touch, B hamstring tightness, -10* knee extension bilaterally)      Cervical / Trunk Assessment: Normal  Communication   Communication: No difficulties  Cognition Arousal/Alertness: Awake/alert Behavior During Therapy: WFL for tasks assessed/performed Overall Cognitive Status:  Within Functional Limits for tasks assessed                      General Comments      Exercises        Assessment/Plan    PT Assessment Patient needs continued PT services  PT Diagnosis Difficulty walking;Acute pain;Generalized weakness   PT Problem List Decreased activity tolerance;Decreased balance;Decreased mobility  PT Treatment Interventions Gait training;Stair training;Functional mobility training;Therapeutic activities;Patient/family education;Balance training;Therapeutic exercise   PT Goals (Current goals can be found in the Care Plan section) Acute Rehab PT Goals Patient Stated Goal: to return to playing drums in a band PT Goal Formulation: With patient/family Time For Goal Achievement: 11/10/14 Potential to Achieve Goals: Good    Frequency Min 3X/week   Barriers to discharge        Co-evaluation               End of Session Equipment Utilized During Treatment: Gait belt Activity Tolerance: Treatment limited secondary to medical complications (Comment) (frequent urge to have BM) Patient left: in chair;with call bell/phone within reach;with family/visitor present Nurse Communication: Mobility status         Time: 9150-5697 PT Time Calculation (min) (ACUTE ONLY): 39 min   Charges:   PT Evaluation $Initial PT Evaluation Tier I: 1 Procedure PT Treatments $Therapeutic Activity: 8-22 mins   PT G Codes:          Philomena Doheny 10/27/2014, 12:29 PM  [phone]

## 2014-10-27 NOTE — Progress Notes (Signed)
Pt alert and oriented all shift, came into pts room at 1864 to find pt sleeping. Attempted to wake pt with verbal and physical stimulus, pt fluttered eyes but did not wake up. Pen point pupils noted. Vitals signs and CBG stable, heart rhythm NSR. Rapid response called and on call MD notified. New orders written. Blood gas unremarkable. Administered Narcan. Continue to monitor and wait for CT results.   Shelbie Hutching, RN

## 2014-10-27 NOTE — Progress Notes (Signed)
Patient continues to refuse nocturnal CPAP and states he does not use it at home either. He prefers sitting up while in bed and says this helps to prevent his apneic events and therefore sleeps in a chair at home. Education provided. He is aware that RT is available at any time if he should change his mind.

## 2014-10-27 NOTE — Plan of Care (Signed)
Problem: Phase I Progression Outcomes Goal: OOB as tolerated unless otherwise ordered Outcome: Completed/Met Date Met:  10/27/14     

## 2014-10-27 NOTE — Progress Notes (Signed)
[facility] PROGRESS NOTE  Walter Gill VEL:[id number] DOB: 05-27-1934 DOA: 10/24/2014 PCP: Chancy Hurter, MD  Assessment/Plan: 1. Fecal impaction with overflow incontinence/ constipation: Improved with golytely.  Denies any nausea or vomiting or abdominal pain.  Gastroenterology consulted and recommendations given. Will advance diet and watch him. Physical therapy evalaution ordered. abd film show marked gastric distention and mild colon distention  Hypokalemia replete as needed.   DVT prophylaxis.   Code Status: full code Family Communication: wife at bedside Disposition Plan: pending.    Consultants:  gastroenterology  Procedures:  none  Antibiotics:  none  HPI/Subjective: Multiple BM tonight , non this am.  Feels restless.  Objective: Filed Vitals:   10/27/14 1438  BP: 153/69  Pulse: 85  Temp: 98.5 F (36.9 C)  Resp: 20    Intake/Output Summary (Last 24 hours) at 10/27/14 1724 Last data filed at 10/27/14 1438  Gross per 24 hour  Intake    360 ml  Output    950 ml  Net   -590 ml   Filed Weights   10/24/14 2315  Weight: 108.001 kg (238 lb 1.6 oz)    Exam:   General:  Alert afebrile comfortable  Cardiovascular: s1s2  Respiratory: ctab  Abdomen: soft non tender, distended bowel sounds heard.   Musculoskeletal:2 + pedal edema.   Data Reviewed: Basic Metabolic Panel:  Recent Labs Lab 10/24/14 1733 10/25/14 0505 10/26/14 0525 10/27/14 1254  NA 139 138 140 138  K 3.8 3.2* 3.2* 3.9  CL 96 98 102 103  CO2 26 26 26 23   GLUCOSE 193* 151* 142* 123*  BUN 21 21 15 14   CREATININE 0.63 0.78 0.69 0.72  CALCIUM 9.4 9.0 8.8 8.7   Liver Function Tests:  Recent Labs Lab 10/24/14 1733  AST 25  ALT <5  ALKPHOS 99  BILITOT 1.6*  PROT 7.6  ALBUMIN 3.8    Recent Labs Lab 10/24/14 1733  LIPASE 9*   No results for input(s): AMMONIA in the last 168 hours. CBC:  Recent Labs Lab 10/24/14 1733 10/25/14 0505  WBC 4.6 8.4   NEUTROABS 4.3  --   HGB 14.4 13.5  HCT 43.1 41.4  MCV 83.7 83.8  PLT 122* 133*   Cardiac Enzymes: No results for input(s): CKTOTAL, CKMB, CKMBINDEX, TROPONINI in the last 168 hours. BNP (last 3 results) No results for input(s): PROBNP in the last 8760 hours. CBG: No results for input(s): GLUCAP in the last 168 hours.  Recent Results (from the past 240 hour(s))  Culture, Urine     Status: None   Collection Time: 10/24/14  7:18 PM  Result Value Ref Range Status   Specimen Description URINE, RANDOM  Final   Special Requests NONE  Final   Culture  Setup Time   Final    10/25/2014 12:55 Performed at [place]   Final    70,000 COLONIES/ML Performed at [facility]    Culture   Final    Multiple bacterial morphotypes present, none predominant. Suggest appropriate recollection if clinically indicated. Performed at [facility]    Report Status 10/27/2014 FINAL  Final     Studies: Dg Abd 1 View  10/27/2014   CLINICAL DATA:  Abdominal distention.  EXAM: ABDOMEN - 1 VIEW  COMPARISON:  CT of the abdomen and pelvis on 10/24/2014.  FINDINGS: There is interval development of significant gastric distention. The colon is mildly dilated. No significant small bowel dilatation is identified.  IMPRESSION: Significant gastric distention.  Mild dilatation of the colon.   Electronically Signed   By: Aletta Edouard M.D.   On: 10/27/2014 13:40    Scheduled Meds: . allopurinol  100 mg Oral Daily  . aspirin  81 mg Oral QHS  . carbidopa-levodopa  1 tablet Oral 5 X Daily  . darifenacin  15 mg Oral Daily  . Glycerin (Adult)  1 suppository Rectal BID  . heparin  5,000 Units Subcutaneous 3 times per day  . metoCLOPramide (REGLAN) injection  10 mg Intravenous 3 times per day  . mirabegron ER  25 mg Oral QHS  . Naloxegol Oxalate  25 mg Oral Daily  . nitrofurantoin (macrocrystal-monohydrate)  100 mg Oral QHS  . rosuvastatin  10 mg Oral q1800  .  selegiline  5 mg Oral BID  . tamsulosin  0.4 mg Oral QHS   Continuous Infusions:    Principal Problem:   Constipation Active Problems:   OSA on CPAP   Chronic pain syndrome    Time spent: 20 minutes    [place] [facility] Pager [phone]. If 7PM-7AM, please contact night-coverage at [email or web address], password [facility] 10/27/2014, 5:24 PM  LOS: 3 days

## 2014-10-27 NOTE — Progress Notes (Addendum)
Clinical Social Work Department CLINICAL SOCIAL WORK PLACEMENT NOTE 10/27/2014  Patient:  Walter Gill, Walter Gill  Account Number:  [id number] Admit date:  10/24/2014  Clinical Social Worker:  Sindy Messing, LCSW  Date/time:  10/27/2014 01:45 PM  Clinical Social Work is seeking post-discharge placement for this patient at the following level of care:   [place]   (*CSW will update this form in Epic as items are completed)   10/27/2014  Patient/family provided with [place] Department of Clinical Social Work's list of facilities offering this level of care within the geographic area requested by the patient (or if unable, by the patient's family).  10/27/2014  Patient/family informed of their freedom to choose among providers that offer the needed level of care, that participate in Medicare, Medicaid or managed care program needed by the patient, have an available bed and are willing to accept the patient.  10/27/2014  Patient/family informed of [facility]' ownership interest in [facility], as well as of the fact that they are under no obligation to receive care at this facility.  PASARR submitted to EDS on existing # PASARR number received on   FL2 transmitted to all facilities in geographic area requested by pt/family on  10/27/2014 FL2 transmitted to all facilities within larger geographic area on   Patient informed that his/her managed care company has contracts with or will negotiate with  certain facilities, including the following:     Patient/family informed of bed offers received:  10/28/14 Patient chooses bed at [facility] Physician recommends and patient chooses bed at    Patient to be transferred to [facility] on  10/29/14 Patient to be transferred to facility by [facility] Patient and family notified of transfer on 10/29/14 Name of family member notified:  Wife at bedside  The following physician request were entered in Epic:   Additional Comments:

## 2014-10-27 NOTE — Progress Notes (Signed)
Clinical Social Work Department BRIEF PSYCHOSOCIAL ASSESSMENT 10/27/2014  Patient:  Walter Gill, Walter Gill     Account Number:  [id number]     Admit date:  10/24/2014  Clinical Social Worker:  Earlie Server  Date/Time:  10/27/2014 01:45 PM  Referred by:  Physician  Date Referred:  10/27/2014 Referred for  SNF Placement   Other Referral:   Interview type:  Patient Other interview type:    PSYCHOSOCIAL DATA Living Status:  FAMILY Admitted from facility:   Level of care:   Primary support name:  Audrea Muscat Primary support relationship to patient:  SPOUSE Degree of support available:   Strong    CURRENT CONCERNS Current Concerns  Post-Acute Placement   Other Concerns:    SOCIAL WORK ASSESSMENT / PLAN CSW received referral in order to assist with DC planning. CSW reviewed chart and met with patient and wife at bedside. CSW introduced myself and explained role. Patient agreeable to wife's involvement during assessment.    Prior to admission, patient was living at home with his wife. Patient has used a walker to ambulate and reports he was active. Patient lives in one story townhouse with wife. Patient is not currently active with HH but used [facility] in the past. CSW spoke with patient about PT recommendations for SNF placement at DC. Patient and wife report that if patient is continent and stronger then their first choice would be to go home. Patient went to [facility] about 10 years ago after a surgery but would feel most comfortable at home. CSW provided SNF list and explained Medicare coverage for SNF. Wife reports [facility] is close to home and they would want to consider placement at this facility if needed. CSW explained that [facility] search could be completed and CSW will continue to follow. CSW explained if patient does regain strength then CM could assist with HH needs.    CSW completed FL2 and faxed out. CSW will follow up with bed offers.   Assessment/plan status:   Psychosocial Support/Ongoing Assessment of Needs Other assessment/ plan:   Information/referral to community resources:   SNF information    PATIENT'S/FAMILY'S RESPONSE TO PLAN OF CARE: Patient alert and oriented but drowsy and did not participate much in assessment. Patient answered basic questions but allowed wife to answer and ask most questions during assessment. Wife and patient both agree that patient wants to come home but wife knows she cannot for patient in his current condition. Patient and wife agreeable to [facility] search in order to be prepared but prefer [facility] services if patient becomes stronger.       [place], [place] [phone]

## 2014-10-28 ENCOUNTER — Inpatient Hospital Stay (HOSPITAL_COMMUNITY): Payer: Medicare Other

## 2014-10-28 MED ORDER — POLYETHYLENE GLYCOL 3350 17 G PO PACK
17.0000 g | PACK | Freq: Every day | ORAL | Status: DC
  Administered 2014-10-28 – 2014-10-29 (×2): 17 g via ORAL
  Filled 2014-10-28 (×2): qty 1

## 2014-10-28 MED ORDER — BISACODYL 5 MG PO TBEC
10.0000 mg | DELAYED_RELEASE_TABLET | Freq: Every morning | ORAL | Status: DC
  Administered 2014-10-28 – 2014-10-29 (×2): 10 mg via ORAL
  Filled 2014-10-28 (×2): qty 2

## 2014-10-28 NOTE — Progress Notes (Signed)
[facility] PROGRESS NOTE  AADARSH COZORT KGY:[id number] DOB: 1934/03/14 DOA: 10/24/2014 PCP: Chancy Hurter, MD Interim summary: Walter Gill is a 80 y.o. male who presents with constipation and worsening abdominal distention for the past 3 weeks. He was admitted to medical service for further evaluation. He was started on golytely and he had multiple bowel movements. Gastroenterology consulted and recommendations given.  Assessment/Plan: 1. Fecal impaction with overflow incontinence/ constipation: Improved with golytely.  Denies any nausea or vomiting or abdominal pain.  Gastroenterology consulted and recommendations given. Will advance diet and watch him. Physical therapy evalaution ordered. abd film show marked gastric distention and mild colon distention.   Hypokalemia replete as needed.   Fever : CXR ordered.   DVT prophylaxis.   Code Status: full code Family Communication: wife at bedside Disposition Plan: pending.    Consultants:  gastroenterology  Procedures:  none  Antibiotics:  none  HPI/Subjective: Feels better.   Objective: Filed Vitals:   10/28/14 1402  BP: 119/52  Pulse: 82  Temp: 98 F (36.7 C)  Resp:     Intake/Output Summary (Last 24 hours) at 10/28/14 1735 Last data filed at 10/28/14 1722  Gross per 24 hour  Intake    240 ml  Output   2550 ml  Net  -2310 ml   Filed Weights   10/24/14 2315  Weight: 108.001 kg (238 lb 1.6 oz)    Exam:   General:  Alert afebrile comfortable  Cardiovascular: s1s2  Respiratory: ctab  Abdomen: soft non tender, distended bowel sounds heard.   Musculoskeletal:2 + pedal edema.   Data Reviewed: Basic Metabolic Panel:  Recent Labs Lab 10/24/14 1733 10/25/14 0505 10/26/14 0525 10/27/14 1254  NA 139 138 140 138  K 3.8 3.2* 3.2* 3.9  CL 96 98 102 103  CO2 26 26 26 23   GLUCOSE 193* 151* 142* 123*  BUN 21 21 15 14   CREATININE 0.63 0.78 0.69 0.72  CALCIUM 9.4 9.0 8.8 8.7    Liver Function Tests:  Recent Labs Lab 10/24/14 1733  AST 25  ALT <5  ALKPHOS 99  BILITOT 1.6*  PROT 7.6  ALBUMIN 3.8    Recent Labs Lab 10/24/14 1733  LIPASE 9*   No results for input(s): AMMONIA in the last 168 hours. CBC:  Recent Labs Lab 10/24/14 1733 10/25/14 0505  WBC 4.6 8.4  NEUTROABS 4.3  --   HGB 14.4 13.5  HCT 43.1 41.4  MCV 83.7 83.8  PLT 122* 133*   Cardiac Enzymes: No results for input(s): CKTOTAL, CKMB, CKMBINDEX, TROPONINI in the last 168 hours. BNP (last 3 results) No results for input(s): PROBNP in the last 8760 hours. CBG:  Recent Labs Lab 10/27/14 1918  GLUCAP 103*    Recent Results (from the past 240 hour(s))  Culture, Urine     Status: None   Collection Time: 10/24/14  7:18 PM  Result Value Ref Range Status   Specimen Description URINE, RANDOM  Final   Special Requests NONE  Final   Culture  Setup Time   Final    10/25/2014 12:55 Performed at [place]   Final    70,000 COLONIES/ML Performed at [facility]    Culture   Final    Multiple bacterial morphotypes present, none predominant. Suggest appropriate recollection if clinically indicated. Performed at [facility]    Report Status 10/27/2014 FINAL  Final     Studies: Dg Abd 1 View  10/27/2014  CLINICAL DATA:  Abdominal distention.  EXAM: ABDOMEN - 1 VIEW  COMPARISON:  CT of the abdomen and pelvis on 10/24/2014.  FINDINGS: There is interval development of significant gastric distention. The colon is mildly dilated. No significant small bowel dilatation is identified.  IMPRESSION: Significant gastric distention.  Mild dilatation of the colon.   Electronically Signed   By: Aletta Edouard M.D.   On: 10/27/2014 13:40   Ct Head Wo Contrast  10/27/2014   CLINICAL DATA:  Lethargy and altered mental status  EXAM: CT HEAD WITHOUT CONTRAST  TECHNIQUE: Contiguous axial images were obtained from the base of the skull through the  vertex without intravenous contrast.  COMPARISON:  03/30/2008  FINDINGS: The bony calvarium is intact. The paranasal sinuses and mastoid air cells are well aerated. Chronic atrophic changes are noted stable from the previous exam. No findings to suggest acute hemorrhage, acute infarction or space-occupying mass lesion are seen.  IMPRESSION: Chronic atrophic an ischemic changes without acute abnormality.   Electronically Signed   By: Inez Catalina M.D.   On: 10/27/2014 20:45    Scheduled Meds: . allopurinol  100 mg Oral Daily  . aspirin  81 mg Oral QHS  . bisacodyl  10 mg Oral q morning - 10a  . carbidopa-levodopa  1 tablet Oral 5 X Daily  . darifenacin  15 mg Oral Daily  . Glycerin (Adult)  1 suppository Rectal BID  . heparin  5,000 Units Subcutaneous 3 times per day  . mirabegron ER  25 mg Oral QHS  . nitrofurantoin (macrocrystal-monohydrate)  100 mg Oral QHS  . polyethylene glycol  17 g Oral Daily  . rosuvastatin  10 mg Oral q1800  . selegiline  5 mg Oral BID  . tamsulosin  0.4 mg Oral QHS   Continuous Infusions:    Principal Problem:   Constipation Active Problems:   OSA on CPAP   Chronic pain syndrome   CN (constipation)   Impaction of colon    Time spent: 20 minutes    [place] [facility] Pager [phone]. If 7PM-7AM, please contact night-coverage at [email or web address], password [facility] 10/28/2014, 5:35 PM  LOS: 4 days

## 2014-10-28 NOTE — Progress Notes (Signed)
Event: Notified by RN at approx 1920 that pt unresponsive. She reported pt had been alert, oriented and OOB several times today w/ assistance. When she attempted to awaken him at approx 1900 pt unresponsive to noxious stimuli. The only known medication pt had been given was Reglan 10 mg IV at approx 1500-1600 this PM. Of note this was pt's first dose of Reglan which was started today. RR RN was paged and responded to bedside. An ABG was ordered and was noted to be unremarkable. CBG 103. NP to bedside. Subjective/Objective: Walter Gill is a 80 y/o gentleman admitted 10/24/2014 for constipation. His hospitalization has been complicated by OSA and chronic pain syndrome. At bedside pt is noted very somnolent. He will awaken to deep sternal rub but will not stay awake. Pupils 1-2 mm and sluggish. Pt will respond to voice and deny pain by shaking his head but remains very lethargic. With much prompting pt will follow simple commands. No obvious facial droop or other obvious focal findings. Abd noted distended but soft w/ normal bs. BBS CTA. T-100.4, BP-141/63, P-71,R-20 w/ 02 sats of 100% on R/A.  No response with IV Narcan. CT head was obtained and was w/o acute findings.  Assessment/Plan: 1. Lethargy/Somnolence: Improved. At the time of this note pt is now more alert and responsive. Pt does have h/o OSA and does not use CPAP at home. However there were no periods of apnea or hypoxia and given results of ABG feel this is not likely the source of pt's somnolence. Reglan does carry possible side effect of "drowsiness"  which may be enhanced in the elderly. Will observe for improved mental alertness as pt rec'd Reglan 4-6 hours ago now. Will hold the Reglan for now. No indication for transfer at this time. Will continue to monitor closely.  Jeryl Columbia, NP-C [facility] Pager [phone]

## 2014-10-28 NOTE — Progress Notes (Signed)
Patient received 2 tap water enemas, with good results.  Durwin Nora RN

## 2014-10-28 NOTE — Progress Notes (Signed)
Continues to decline the use of nocturnal CPAP. Order changed to prn per RT protocol.

## 2014-10-28 NOTE — Progress Notes (Signed)
Patient ID: Walter Gill, male   DOB: Apr 16, 1934, 80 y.o.   MRN: [id number]  [facility] Progress Note  Subjective: Pt feeling much better ,feels gassy this am- has had several Bm's past 24 hours. Pt had an unresponsive episode last night - possibly related to Iv Reglan- Reglan  Stopped- he is mentating  Normally today Wife has lots of questions about meds and regimen for his chronic constipation-  Had been started on Movantik a month or so ago- was not working but kept taking it.   SUBJective:  Vital signs in last 24 hours: Temp:  [98.5 F (36.9 C)-100.4 F (38 C)] 98.5 F (36.9 C) (11/17 0540) Pulse Rate:  [68-85] 68 (11/17 0540) Resp:  [18-20] 18 (11/17 0540) BP: (141-155)/(63-76) 148/65 mmHg (11/17 0540) SpO2:  [96 %-100 %] 96 % (11/17 0540) Last BM Date: 10/27/14 General:   Alert,  Well-developed,Elderly WM    in NAD Heart:  Regular rate and rhythm; no murmurs Pulm;clear Abdomen: Large  Soft, nontender and nondistended. Normal bowel sounds, without guarding, and without rebound.   Extremities:  Without edema. Neurologic:  Alert and  oriented x4;  grossly normal neurologically. Psych:  Alert and cooperative. Normal mood and affect.  Intake/Output from previous day: 11/16 0701 - 11/17 0700 In: 240 [P.O.:240] Out: 1650 [Urine:1650] Intake/Output this shift: Total I/O In: 240 [P.O.:240] Out: -   Lab Results: No results for input(s): WBC, HGB, HCT, PLT in the last 72 hours. BMET  Recent Labs  10/26/14 0525 10/27/14 1254  NA 140 138  K 3.2* 3.9  CL 102 103  CO2 26 23  GLUCOSE 142* 123*  BUN 15 14  CREATININE 0.69 0.72  CALCIUM 8.8 8.7    Assessment / Plan: #1  80 yo male with Parkinsons, on multiple meds with chronic constipation/colinic inertia likely in part secondary to parkinsons- now with resolving fecal impaction Movantik has been stopped and should not be restarted -  Linzess was ineffective at lower dose Think best to go back to   Miralax 17 gm every day , and dulcolax 2 daily- if no BM for 2 days then dulcolax supp He may need prn enemas Keep him off narcotics Will give 2 tap water enemas today, and one in am make sure he is purged before he goes out of hospital. Also- Miralax bid while in hospital Discussed at length with pt and wife Principal Problem:   Constipation Active Problems:   OSA on CPAP   Chronic pain syndrome   CN (constipation)   Impaction of colon     LOS: 4 days   Amy Esterwood  10/28/2014, 9:22 AM   Attending MD note:   I have taken a history, examined the patient, and reviewed the chart. I agree with the Advanced Practitioner's impression and recommendations. I had a discussion with wife about  Outpatient bowl regimen. I think the Movantik is not working because he does not have narcotic bowl. Instead, we have discussed an alternative regimen as above.  [facility] Pager # [phone]

## 2014-10-28 NOTE — Progress Notes (Signed)
Clinical Social Work  CSW met with patient and wife at bedside to provide bed offers. Patient and wife upset that [facility] is not available but wife reports that [facility] is close to home so she will tour there tonight. CSW explained once MD DC patient then decision will have to be made. Wife reports she will research options tonight and will have a decision by the morning. CSW will continue to follow.  [place], [place] [phone]

## 2014-10-29 ENCOUNTER — Inpatient Hospital Stay (HOSPITAL_COMMUNITY): Payer: Medicare Other

## 2014-10-29 DIAGNOSIS — I5032 Chronic diastolic (congestive) heart failure: Secondary | ICD-10-CM | POA: Diagnosis not present

## 2014-10-29 DIAGNOSIS — R52 Pain, unspecified: Secondary | ICD-10-CM | POA: Diagnosis not present

## 2014-10-29 DIAGNOSIS — K5901 Slow transit constipation: Secondary | ICD-10-CM | POA: Diagnosis not present

## 2014-10-29 DIAGNOSIS — N390 Urinary tract infection, site not specified: Secondary | ICD-10-CM | POA: Diagnosis not present

## 2014-10-29 DIAGNOSIS — N400 Enlarged prostate without lower urinary tract symptoms: Secondary | ICD-10-CM | POA: Diagnosis present

## 2014-10-29 DIAGNOSIS — G4733 Obstructive sleep apnea (adult) (pediatric): Secondary | ICD-10-CM | POA: Diagnosis not present

## 2014-10-29 DIAGNOSIS — E785 Hyperlipidemia, unspecified: Secondary | ICD-10-CM | POA: Diagnosis present

## 2014-10-29 DIAGNOSIS — G20 Parkinson's disease: Secondary | ICD-10-CM | POA: Diagnosis present

## 2014-10-29 DIAGNOSIS — R262 Difficulty in walking, not elsewhere classified: Secondary | ICD-10-CM | POA: Diagnosis not present

## 2014-10-29 DIAGNOSIS — R590 Localized enlarged lymph nodes: Secondary | ICD-10-CM | POA: Diagnosis not present

## 2014-10-29 DIAGNOSIS — K5649 Other impaction of intestine: Secondary | ICD-10-CM | POA: Diagnosis not present

## 2014-10-29 DIAGNOSIS — M545 Low back pain: Secondary | ICD-10-CM | POA: Diagnosis not present

## 2014-10-29 DIAGNOSIS — R531 Weakness: Secondary | ICD-10-CM | POA: Diagnosis not present

## 2014-10-29 DIAGNOSIS — N401 Enlarged prostate with lower urinary tract symptoms: Secondary | ICD-10-CM | POA: Diagnosis not present

## 2014-10-29 DIAGNOSIS — Z79899 Other long term (current) drug therapy: Secondary | ICD-10-CM | POA: Diagnosis not present

## 2014-10-29 DIAGNOSIS — K5900 Constipation, unspecified: Secondary | ICD-10-CM | POA: Diagnosis not present

## 2014-10-29 DIAGNOSIS — M6281 Muscle weakness (generalized): Secondary | ICD-10-CM | POA: Diagnosis not present

## 2014-10-29 DIAGNOSIS — R918 Other nonspecific abnormal finding of lung field: Secondary | ICD-10-CM | POA: Diagnosis not present

## 2014-10-29 DIAGNOSIS — I509 Heart failure, unspecified: Secondary | ICD-10-CM | POA: Diagnosis not present

## 2014-10-29 DIAGNOSIS — G894 Chronic pain syndrome: Secondary | ICD-10-CM | POA: Diagnosis not present

## 2014-10-29 LAB — URINE MICROSCOPIC-ADD ON

## 2014-10-29 LAB — URINALYSIS, ROUTINE W REFLEX MICROSCOPIC
BILIRUBIN URINE: NEGATIVE
Glucose, UA: NEGATIVE mg/dL
Ketones, ur: NEGATIVE mg/dL
Nitrite: NEGATIVE
PH: 6 (ref 5.0–8.0)
Protein, ur: 30 mg/dL — AB
SPECIFIC GRAVITY, URINE: 1.02 (ref 1.005–1.030)
Urobilinogen, UA: 0.2 mg/dL (ref 0.0–1.0)

## 2014-10-29 LAB — PRO B NATRIURETIC PEPTIDE: Pro B Natriuretic peptide (BNP): 809 pg/mL — ABNORMAL HIGH (ref 0–450)

## 2014-10-29 MED ORDER — HYDROCODONE-ACETAMINOPHEN 10-325 MG PO TABS: 1.0000 | ORAL_TABLET | Freq: Two times a day (BID) | ORAL | Status: DC | PRN

## 2014-10-29 MED ORDER — CIPROFLOXACIN HCL 500 MG PO TABS
500.0000 mg | ORAL_TABLET | Freq: Two times a day (BID) | ORAL | Status: DC
  Administered 2014-10-29: 500 mg via ORAL
  Filled 2014-10-29 (×3): qty 1

## 2014-10-29 MED ORDER — CIPROFLOXACIN HCL 500 MG PO TABS: 500.0000 mg | ORAL_TABLET | Freq: Two times a day (BID) | ORAL | Status: AC

## 2014-10-29 MED ORDER — POLYETHYLENE GLYCOL 3350 17 G PO PACK: 17.0000 g | PACK | Freq: Every day | ORAL | Status: AC

## 2014-10-29 MED ORDER — BISACODYL 5 MG PO TBEC: 10.0000 mg | DELAYED_RELEASE_TABLET | Freq: Every morning | ORAL | Status: AC

## 2014-10-29 MED ORDER — CEFTRIAXONE SODIUM IN DEXTROSE 20 MG/ML IV SOLN
1.0000 g | INTRAVENOUS | Status: DC
  Administered 2014-10-29: 1 g via INTRAVENOUS
  Filled 2014-10-29: qty 50

## 2014-10-29 MED ORDER — FUROSEMIDE 40 MG PO TABS
40.0000 mg | ORAL_TABLET | ORAL | Status: AC
  Administered 2014-10-29: 40 mg via ORAL
  Filled 2014-10-29: qty 1

## 2014-10-29 MED ORDER — BISACODYL 10 MG RE SUPP: 10.0000 mg | Freq: Every day | RECTAL | Status: DC | PRN

## 2014-10-29 NOTE — Progress Notes (Signed)
Clinical Social Work  CSW met with patient and wife at bedside who has chosen [facility]. CSW spoke with SNF who is agreeable to accept patient today or whenever medically stable. CSW will continue to follow.  Holly Gerber, LCSW [phone] 

## 2014-10-29 NOTE — Plan of Care (Signed)
Problem: Discharge Progression Outcomes Goal: Hemodynamically stable Outcome: Completed/Met Date Met:  10/29/14     

## 2014-10-29 NOTE — Progress Notes (Signed)
    Progress Note   Subjective  Had large BM after second enema last night. Feels a little "full" after eating breakfast.    Objective   Vital signs in last 24 hours: Temp:  [98 F (36.7 C)-100.4 F (38 C)] 98 F (36.7 C) (11/18 0515) Pulse Rate:  [78-82] 78 (11/18 0515) Resp:  [2-18] 2 (11/18 0515) BP: (119-137)/(52-65) 137/65 mmHg (11/18 0515) SpO2:  [95 %-98 %] 95 % (11/18 0515) Last BM Date: 10/28/14 General:    pleaseant obese white male in NAD Heart:  Regular rate and rhythm; no murmurs Lungs: Respirations even and unlabored, lungs CTA bilaterally Abdomen:  Soft, nontender and nondistended. Normal bowel sounds. Extremities:  Without edema. Neurologic:  Alert and oriented,  grossly normal neurologically. Psych:  Cooperative. Normal mood and affect.   Lab Results:  BMET  Recent Labs  10/27/14 1254  NA 138  K 3.9  CL 103  CO2 23  GLUCOSE 123*  BUN 14  CREATININE 0.72  CALCIUM 8.7   Studies/Results: Dg Chest 2 View  10/29/2014   CLINICAL DATA:  80 year old male with history of weakness. No current chest complaints.  EXAM: CHEST - 2 VIEW  COMPARISON:  05/13/2013, 05/18/2009, abdominal CT 10/24/2014  FINDINGS: Cardiomediastinal silhouette unchanged in size and contour. No evidence of central pulmonary vascular congestion.  No visualized pneumothorax or pleural effusion.  Lung volumes are low, with linear opacities at the costophrenic angle on the right. Coarsened interstitial markings persist as compared to prior plain film. No confluent airspace disease.  No displaced fracture.  Scoliotic curvature of the thoracic spine, with apex right curvature centered at T4-T5.  Atherosclerotic calcifications.  IMPRESSION: No radiographic evidence of acute cardiopulmonary disease, with the appearance of bibasilar linear opacities likely related to low lung volumes and atelectasis/scarring.  Atherosclerosis  Signed,  Dulcy Fanny. Earleen Newport, DO  [facility]  [facility]   Electronically Signed   By: Corrie Mckusick D.O.   On: 10/29/2014 06:36   Dg Abd 1 View  10/27/2014   CLINICAL DATA:  Abdominal distention.  EXAM: ABDOMEN - 1 VIEW  COMPARISON:  CT of the abdomen and pelvis on 10/24/2014.  FINDINGS: There is interval development of significant gastric distention. The colon is mildly dilated. No significant small bowel dilatation is identified.  IMPRESSION: Significant gastric distention.  Mild dilatation of the colon.   Electronically Signed   By: Aletta Edouard M.D.   On: 10/27/2014 13:40    Assessment / Plan:   75. 80 year old male with chronic constipation / resolving fecal impaction. Medications and Parkinson' s likely cause of constipation. Good response to enemas. He has tried different things for constipation lately but plans on resuming regimen of daily Miralax, 2 daily dulcolax ,prn dulcolax and prn enemas.   2. Gastric distention on abdominal films. Etiology? He didn't tolerate Reglan. No nausea or vomiting. Tolerating diet.      LOS: 5 days   Tye Savoy  10/29/2014, 9:04 AM  Attending MD note:   I have taken a history, examined the patient, and reviewed the chart. I agree with the Advanced Practitioner's impression and recommendations. His gastric distention is asymptomatic- he has been tolerating regular diet.  No further work up. Consider GES if he develops nausea or vomiting.  [facility] Pager # [phone]

## 2014-10-29 NOTE — Plan of Care (Signed)
Problem: Discharge Progression Outcomes Goal: Activity appropriate for discharge plan Outcome: Completed/Met Date Met:  10/29/14

## 2014-10-29 NOTE — Progress Notes (Signed)
Clinical Social Work  CSW faxed DC summary to [facility] who is agreeable to accept patient today. CSW prepared DC packet with FL2, DC summary, and hard scripts included. CSW informed patient and wife of DC plans and both parties agreeable and happy to be DC to [facility]. RN to call report to SNF. Patient requests [facility] for transportation and is aware of no guarantee of payment. [facility] #: [id number].  CSW is signing off but available if needed.  [place], [place] [phone]

## 2014-10-29 NOTE — Discharge Summary (Signed)
Discharge Summary  Walter Gill PPI:[id number] DOB: [birth year]  PCP: Chancy Hurter, MD  Admit date: 10/24/2014 Discharge date: 10/29/2014  Time spent: 35 minutes  Recommendations for Outpatient Follow-up:  1. New medication: Cipro 500 mg by mouth twice a day 7 days 2. New bowel regimen: Mira lax daily, 2 Dulcolax tablets daily and if no bowel movement every 48 hours, Dulcolax suppository  3. Medication changes: Following medications have been discontinued: Reglan, Colace, Movantik 4. [facility] being [facility]  Discharge Diagnoses:  Active Hospital Problems   Diagnosis Date Noted  . Constipation 10/24/2014  . Hyperlipidemia 10/29/2014  . Chronic diastolic heart failure 06/10/1600  . Parkinson disease 10/29/2014  . UTI (urinary tract infection) 10/29/2014  . BPH (benign prostatic hypertrophy) 10/29/2014  . CN (constipation)   . Impaction of colon   . Chronic pain syndrome 10/01/2014  . OSA on CPAP 04/17/2013    Resolved Hospital Problems   Diagnosis Date Noted Date Resolved  No resolved problems to display.    Discharge Condition: improved, being discharged to skilled nursing facility  Diet recommendation: heart healthy  Filed Weights   10/24/14 2315  Weight: 108.001 kg (238 lb 1.6 oz)    History of present illness:  80 year old male with past mental history of constipation/worsening abdominal distention 3 weeks admitted on 11/13 for treatment  Hospital Course:  Principal Problem:   Constipation with impaction of colon secondary to chronic narcotics for chronic pain syndrome: seen by gastroenterology. Patient started on GoLYTELY and had multiple bowel movements. Follow-up abdominal films noted some gastric distention and mild colonic distention.after several days of therapy, patient was finally felt to be cleaned out. GI recommended long-term plan for daily marrow lax, 2 tablets daily Dulcolax followed by Dulcolax  suppository if no bowel movement every 24 hours Active Problems:   OSA on CPAP: Stable, continued on C Pap    Hyperlipidemia: Stable    Chronic diastolic heart failure: patient with previoushistory, seen by echocardiogram. BNP checked prior to discharge and only at 809. Given 1 dose of Lasix before discharge    Parkinson disease: Patient will continue on Sinemet.    UTI (urinary tract infection): On 11/17, patient spike temperature. Urinalysis notes UTI and Cipro started.    BPH (benign prostatic hypertrophy): Stable   Procedures:  none  Consultations:  gastroenterology  Discharge Exam: BP 137/65 mmHg  Pulse 78  Temp(Src) 98 F (36.7 C) (Oral)  Resp 2  Ht 5' 7.5" (1.715 m)  Wt 108.001 kg (238 lb 1.6 oz)  BMI 36.72 kg/m2  SpO2 95%  General: alert and oriented, no acute distress Cardiovascular: regular rate and rhythm, S1-S2 Respiratory: clear to auscultation bilaterally  Discharge Instructions You were cared for by a hospitalist during your hospital stay. If you have any questions about your discharge medications or the care you received while you were in the hospital after you are discharged, you can call the unit and asked to speak with the hospitalist on call if the hospitalist that took care of you is not available. Once you are discharged, your primary care physician will handle any further medical issues. Please note that NO REFILLS for any discharge medications will be authorized once you are discharged, as it is imperative that you return to your primary care physician (or establish a relationship with a primary care physician if you do not have one) for your aftercare needs so that they can reassess your need for medications and monitor your lab  values.  Discharge Instructions    Diet - low sodium heart healthy    Complete by:  As directed      Increase activity slowly    Complete by:  As directed             Medication List    STOP taking these medications         docusate sodium 100 MG capsule  Commonly known as:  COLACE     MOVANTIK 25 MG Tabs  Generic drug:  Naloxegol Oxalate      TAKE these medications        allopurinol 100 MG tablet  Commonly known as:  ZYLOPRIM  Take 100 mg by mouth daily.     AMBULATORY NON FORMULARY MEDICATION  1 Units by Does not apply route daily. Set CPAP to 12     aspirin 81 MG chewable tablet  Chew 81 mg by mouth at bedtime.     bisacodyl 5 MG EC tablet  Commonly known as:  DULCOLAX  Take 2 tablets (10 mg total) by mouth every morning.     carbidopa-levodopa 50-200 MG per tablet  Commonly known as:  SINEMET CR  Take 1 tablet by mouth 5 (five) times daily.     ciprofloxacin 500 MG tablet  Commonly known as:  CIPRO  Take 1 tablet (500 mg total) by mouth 2 (two) times daily.     furosemide 40 MG tablet  Commonly known as:  LASIX  Take 40 mg by mouth 2 (two) times daily.     HYDROcodone-acetaminophen 10-325 MG per tablet  Commonly known as:  NORCO  Take 1 tablet by mouth 2 (two) times daily as needed for moderate pain.     MYRBETRIQ 25 MG Tb24 tablet  Generic drug:  mirabegron ER  Take 25 mg by mouth at bedtime.     nitrofurantoin (macrocrystal-monohydrate) 100 MG capsule  Commonly known as:  MACROBID  Take 100 mg by mouth at bedtime.     polyethylene glycol packet  Commonly known as:  MIRALAX / GLYCOLAX  Take 17 g by mouth daily.     potassium chloride SA 20 MEQ tablet  Commonly known as:  K-DUR,KLOR-CON  Take 20 mEq by mouth 2 (two) times daily.     rosuvastatin 10 MG tablet  Commonly known as:  CRESTOR  Take 10 mg by mouth daily.     SANCTURA XR 60 MG Cp24  Generic drug:  Trospium Chloride  Take 60 mg by mouth every morning.     selegiline 5 MG capsule  Commonly known as:  ELDEPRYL  Take 5 mg by mouth 2 (two) times daily.     tamsulosin 0.4 MG Caps capsule  Commonly known as:  FLOMAX  Take 0.4 mg by mouth at bedtime.       Allergies  Allergen Reactions  .  Metoclopramide Other (See Comments)    Interference with Sinemet, blocks dopamine leading to sedation      The results of significant diagnostics from this hospitalization (including imaging, microbiology, ancillary and laboratory) are listed below for reference.    Significant Diagnostic Studies: Dg Chest 2 View  10/29/2014   CLINICAL DATA:  80 year old male with history of weakness. No current chest complaints.  EXAM: CHEST - 2 VIEW  COMPARISON:  05/13/2013, 05/18/2009, abdominal CT 10/24/2014  FINDINGS: Cardiomediastinal silhouette unchanged in size and contour. No evidence of central pulmonary vascular congestion.  No visualized pneumothorax or pleural effusion.  Lung volumes are low,  with linear opacities at the costophrenic angle on the right. Coarsened interstitial markings persist as compared to prior plain film. No confluent airspace disease.  No displaced fracture.  Scoliotic curvature of the thoracic spine, with apex right curvature centered at T4-T5.  Atherosclerotic calcifications.  IMPRESSION: No radiographic evidence of acute cardiopulmonary disease, with the appearance of bibasilar linear opacities likely related to low lung volumes and atelectasis/scarring.  Atherosclerosis  Signed,  Dulcy Fanny. Earleen Newport, DO  [facility]  [facility]   Electronically Signed   By: Corrie Mckusick D.O.   On: 10/29/2014 06:36   Dg Abd 1 View  10/27/2014   CLINICAL DATA:  Abdominal distention.  EXAM: ABDOMEN - 1 VIEW  COMPARISON:  CT of the abdomen and pelvis on 10/24/2014.  FINDINGS: There is interval development of significant gastric distention. The colon is mildly dilated. No significant small bowel dilatation is identified.  IMPRESSION: Significant gastric distention.  Mild dilatation of the colon.   Electronically Signed   By: Aletta Edouard M.D.   On: 10/27/2014 13:40   Ct Head Wo Contrast  10/27/2014   CLINICAL DATA:  Lethargy and altered mental status   EXAM: CT HEAD WITHOUT CONTRAST  TECHNIQUE: Contiguous axial images were obtained from the base of the skull through the vertex without intravenous contrast.  COMPARISON:  03/30/2008  FINDINGS: The bony calvarium is intact. The paranasal sinuses and mastoid air cells are well aerated. Chronic atrophic changes are noted stable from the previous exam. No findings to suggest acute hemorrhage, acute infarction or space-occupying mass lesion are seen.  IMPRESSION: Chronic atrophic an ischemic changes without acute abnormality.   Electronically Signed   By: Inez Catalina M.D.   On: 10/27/2014 20:45   Ct Abdomen Pelvis W Contrast  10/24/2014   CLINICAL DATA:  Abdomen distention for 3 weeks getting worse, initial evaluation, constipation,  EXAM: CT ABDOMEN AND PELVIS WITH CONTRAST  TECHNIQUE: Multidetector CT imaging of the abdomen and pelvis was performed using the standard protocol following bolus administration of intravenous contrast.  CONTRAST:  29mL OMNIPAQUE IOHEXOL 300 MG/ML SOLN, 194mL OMNIPAQUE IOHEXOL 300 MG/ML SOLN  COMPARISON:  05/18/2009  FINDINGS: Mild intrahepatic and extrahepatic biliary dilatation appears to be likely related to prior cholecystectomy. Although mild this appears mildly progressive when compared to the prior study. Spleen is normal. Pancreas is normal. Common bile duct in the pancreatic head measures about 9 mm.  Adrenal glands are normal. Kidneys show no acute abnormalities. There is calcification of the abdominal aorta.  Bladder is markedly distended. There is dense material layering dependently along the posterior right wall bladder measuring 17 mm in length. Prostate is mildly prominent.  Stomach and small bowel are normal. Appendix has been removed. There is diverticulosis involving the entire colon, more prominent on the left colon. The rectum is distended with stool to a diameter of 10 cm. Rectal wall is circumferentially thickened to 14 mm. Sigmoid colon is distended to 8.5 cm, and  also contains a large volume of stool and also shows wall thickening to 17 mm. Left colon is markedly tortuous and redundant.No findings identified to suggest sigmoid volvulus.  IMPRESSION: The sigmoid colon and rectum are distended and demonstrate diffuse significant wall thickening, and contain a large volume of stool, without evidence of sigmoid volvulus. Findings indicate constipation, and a distal obstructing lesion at the level of the anus is not excluded. Neurogenic causes and laxative abuse are also possible considerations. The wall thickening of the sigmoid  and rectum may reflect secondary engorgement and edema, but inflammation (colitis, ischemia, etc) is not excluded. There is no pneumatosis.  Bladder is significantly distended. There is a small volume of dense material presumably crystalline urine within the bladder.   Electronically Signed   By: Skipper Cliche M.D.   On: 10/24/2014 20:50    Microbiology: Recent Results (from the past 240 hour(s))  Culture, Urine     Status: None   Collection Time: 10/24/14  7:18 PM  Result Value Ref Range Status   Specimen Description URINE, RANDOM  Final   Special Requests NONE  Final   Culture  Setup Time   Final    10/25/2014 12:55 Performed at [facility]   Final    70,000 COLONIES/ML Performed at [facility]    Culture   Final    Multiple bacterial morphotypes present, none predominant. Suggest appropriate recollection if clinically indicated. Performed at [facility]    Report Status 10/27/2014 FINAL  Final     Labs: Basic Metabolic Panel:  Recent Labs Lab 10/24/14 1733 10/25/14 0505 10/26/14 0525 10/27/14 1254  NA 139 138 140 138  K 3.8 3.2* 3.2* 3.9  CL 96 98 102 103  CO2 26 26 26 23   GLUCOSE 193* 151* 142* 123*  BUN 21 21 15 14   CREATININE 0.63 0.78 0.69 0.72  CALCIUM 9.4 9.0 8.8 8.7   Liver Function Tests:  Recent Labs Lab 10/24/14 1733  AST 25  ALT <5  ALKPHOS 99    BILITOT 1.6*  PROT 7.6  ALBUMIN 3.8    Recent Labs Lab 10/24/14 1733  LIPASE 9*   No results for input(s): AMMONIA in the last 168 hours. CBC:  Recent Labs Lab 10/24/14 1733 10/25/14 0505  WBC 4.6 8.4  NEUTROABS 4.3  --   HGB 14.4 13.5  HCT 43.1 41.4  MCV 83.7 83.8  PLT 122* 133*   Cardiac Enzymes: No results for input(s): CKTOTAL, CKMB, CKMBINDEX, TROPONINI in the last 168 hours. BNP: BNP (last 3 results)  Recent Labs  10/29/14 1108  PROBNP 809.0*   CBG:  Recent Labs Lab 10/27/14 1918  GLUCAP 103*       Signed:  Parisa Pinela K  [facility] 10/29/2014, 1:53 PM

## 2014-10-29 NOTE — Plan of Care (Signed)
Problem: Discharge Progression Outcomes Goal: Pain controlled with appropriate interventions Outcome: Completed/Met Date Met:  10/29/14

## 2014-10-29 NOTE — Plan of Care (Signed)
Problem: Discharge Progression Outcomes Goal: Tolerating diet Outcome: Completed/Met Date Met:  10/29/14     

## 2014-10-29 NOTE — Plan of Care (Signed)
Problem: Discharge Progression Outcomes Goal: Other Discharge Outcomes/Goals Outcome: Completed/Met Date Met:  10/29/14     

## 2014-10-29 NOTE — Progress Notes (Signed)
Called report to [place] at [facility].

## 2014-10-29 NOTE — Plan of Care (Signed)
Problem: Discharge Progression Outcomes Goal: Complications resolved/controlled Outcome: Completed/Met Date Met:  10/29/14     

## 2014-10-29 NOTE — Plan of Care (Signed)
Problem: Discharge Progression Outcomes Goal: Discharge plan in place and appropriate Outcome: Completed/Met Date Met:  10/29/14     

## 2014-10-30 DIAGNOSIS — M545 Low back pain: Secondary | ICD-10-CM | POA: Diagnosis not present

## 2014-10-30 DIAGNOSIS — K5900 Constipation, unspecified: Secondary | ICD-10-CM | POA: Diagnosis not present

## 2014-10-30 DIAGNOSIS — M6281 Muscle weakness (generalized): Secondary | ICD-10-CM | POA: Diagnosis not present

## 2014-10-31 DIAGNOSIS — N390 Urinary tract infection, site not specified: Secondary | ICD-10-CM | POA: Diagnosis not present

## 2014-10-31 DIAGNOSIS — M6281 Muscle weakness (generalized): Secondary | ICD-10-CM | POA: Diagnosis not present

## 2014-10-31 DIAGNOSIS — K5900 Constipation, unspecified: Secondary | ICD-10-CM | POA: Diagnosis not present

## 2014-10-31 DIAGNOSIS — R52 Pain, unspecified: Secondary | ICD-10-CM | POA: Diagnosis not present

## 2014-10-31 DIAGNOSIS — R262 Difficulty in walking, not elsewhere classified: Secondary | ICD-10-CM | POA: Diagnosis not present

## 2014-11-03 DIAGNOSIS — M6281 Muscle weakness (generalized): Secondary | ICD-10-CM | POA: Diagnosis not present

## 2014-11-03 DIAGNOSIS — R590 Localized enlarged lymph nodes: Secondary | ICD-10-CM | POA: Diagnosis not present

## 2014-11-11 ENCOUNTER — Telehealth: Payer: Self-pay | Admitting: Physician Assistant

## 2014-11-11 NOTE — Telephone Encounter (Signed)
Cannot leave messages, voicemail is full-left phone number on an automated system

## 2014-11-11 NOTE — Telephone Encounter (Signed)
Spoke with Mrs.Guerrera. The patient has had a normal bowel movement. The rehab center has been adjusting his medications trying to remedy the constipation. She wanted to know if this problem was not better, we could get the patient in to be seen. She is reassured and will call  If things change

## 2014-11-17 DIAGNOSIS — K5900 Constipation, unspecified: Secondary | ICD-10-CM | POA: Diagnosis not present

## 2014-11-17 DIAGNOSIS — M6281 Muscle weakness (generalized): Secondary | ICD-10-CM | POA: Diagnosis not present

## 2014-11-21 DIAGNOSIS — R262 Difficulty in walking, not elsewhere classified: Secondary | ICD-10-CM | POA: Diagnosis not present

## 2014-11-21 DIAGNOSIS — G20 Parkinson's disease: Secondary | ICD-10-CM | POA: Diagnosis not present

## 2014-11-21 DIAGNOSIS — K5900 Constipation, unspecified: Secondary | ICD-10-CM | POA: Diagnosis not present

## 2014-11-21 DIAGNOSIS — I509 Heart failure, unspecified: Secondary | ICD-10-CM | POA: Diagnosis not present

## 2014-11-22 ENCOUNTER — Other Ambulatory Visit: Payer: Self-pay | Admitting: Internal Medicine

## 2014-11-25 ENCOUNTER — Telehealth: Payer: Self-pay | Admitting: Gastroenterology

## 2014-11-25 NOTE — Telephone Encounter (Signed)
Spoke with [facility]. Patient will be released from rehab to home 11/27/14. He is having bowel movements but has an aggressive regimen. She reports the patient basically has "an explosion every day". The patient is hoping to get better control so that he can be out of the house more and not worry about any sudden urges. Appointment made to see AE and discuss his options.

## 2014-12-01 ENCOUNTER — Ambulatory Visit (INDEPENDENT_AMBULATORY_CARE_PROVIDER_SITE_OTHER): Payer: Medicare Other | Admitting: Family Medicine

## 2014-12-01 ENCOUNTER — Encounter: Payer: Self-pay | Admitting: Family Medicine

## 2014-12-01 DIAGNOSIS — K5901 Slow transit constipation: Secondary | ICD-10-CM

## 2014-12-01 DIAGNOSIS — G894 Chronic pain syndrome: Secondary | ICD-10-CM

## 2014-12-01 NOTE — Progress Notes (Signed)
Walter Reddish, MD Phone: [phone]  Subjective:  Patient presents today to establish care with me as their new primary care provider. Patient was formerly a patient of Dr. Leanne Chang. Chief complaint-noted.   Constipation- improving but not on ideal regimen Chronic Pain- moderate poor control Hospitalized 11/13//15 to 10/29/14 due to constipation from chornic narcotics for chronic pain. Patient came in with severe impaction and was treated with golytely in hospital. He was sent home with the following plan"  Miralax 1 capful twice a day, Dulcolax 2 tablets 5 mg tablets daily. He was also started on dulcolax suppositories if no BM in 24 hours. While in nursing home, Bacid probiotic and Magnesium oxide were added as well as Lactulose 10g/1 oz. He is having 2 bowel movements per day-liquidy or sludgy associated with gas.   Due to parkinson's had decreased mental status on reglan when given by GI and this is now listed as allergy.   His chronic pain from dystonia has been moderately poorly controlled on Acetaminophen 625mg  x 2 TID. He does not want to be on narcotics and risk the worsening constipation though.   ROS- bloating, distension-much better than when in hospital  The following were reviewed and entered/updated in epic: Past Medical History  Diagnosis Date  . Sleep apnea   . RBBB (right bundle branch block)   . Parkinson disease   . Gout   . Hyperlipidemia   . Hypertension   . OA (osteoarthritis)   . PE (pulmonary embolism) september 2008  . DVT (deep venous thrombosis)   . Left ventricular dysfunction     impaired LV relaxation  . HYPERTENSION 07/02/2007    Qualifier: Diagnosis of  By: Leanne Chang MD, Bruce     Patient Active Problem List   Diagnosis Date Noted  . Chronic diastolic heart failure 25/04/3975    Priority: High  . Chronic pain syndrome 10/01/2014    Priority: High  . PARKINSON'S DISEASE 07/02/2007    Priority: High  . Hyperlipidemia 10/29/2014    Priority:  Medium  . Constipation 10/24/2014    Priority: Medium  . OSA on CPAP 04/17/2013    Priority: Medium  . DVT, HX OF 10/16/2007    Priority: Medium  . PULMONARY EMBOLISM 10/08/2007    Priority: Medium  . Gout 07/02/2007    Priority: Medium  . B12 deficiency 06/06/2013    Priority: Low  . REM behavioral disorder 10/25/2012    Priority: Low  . RBBB 07/02/2007    Priority: Low  . Osteoarthritis 07/02/2007    Priority: Low   Past Surgical History  Procedure Laterality Date  . Appendectomy    . Cholecystectomy    . Dupruytens contracture      left  . Total hip arthroplasty  9/8/8    right  . Pilonidal cyst removal    . Eye surgery      ? for lid lag vs apraxia of eye opening  . Cataract extraction      bilateral    Family History  Problem Relation Age of Onset  . Heart disease Mother     had murmur, never went to doctor  . Cancer Father     possible gi    Medications- reviewed and updated Current Outpatient Prescriptions  Medication Sig Dispense Refill  . allopurinol (ZYLOPRIM) 100 MG tablet TAKE 1 TABLET BY MOUTH EVERY DAY 30 tablet 0  . AMBULATORY NON FORMULARY MEDICATION 1 Units by Does not apply route daily. Set CPAP to 12 1 Units  0  . aspirin 81 MG chewable tablet Chew 81 mg by mouth at bedtime.     . bisacodyl (DULCOLAX) 5 MG EC tablet Take 2 tablets (10 mg total) by mouth every morning. 30 tablet 0  . carbidopa-levodopa (SINEMET CR) 50-200 MG per tablet Take 1 tablet by mouth 5 (five) times daily. 450 tablet 3  . furosemide (LASIX) 40 MG tablet Take 40 mg by mouth 2 (two) times daily.    . Mirabegron ER (MYRBETRIQ) 25 MG TB24 Take 25 mg by mouth at bedtime.     . nitrofurantoin, macrocrystal-monohydrate, (MACROBID) 100 MG capsule Take 100 mg by mouth at bedtime.     . polyethylene glycol (MIRALAX / GLYCOLAX) packet Take 17 g by mouth daily. 14 each 0  . potassium chloride SA (K-DUR,KLOR-CON) 20 MEQ tablet Take 20 mEq by mouth 2 (two) times daily.    .  rosuvastatin (CRESTOR) 10 MG tablet Take 10 mg by mouth daily.    . selegiline (ELDEPRYL) 5 MG capsule Take 5 mg by mouth 2 (two) times daily.    . tamsulosin (FLOMAX) 0.4 MG CAPS capsule Take 0.4 mg by mouth at bedtime.     No current facility-administered medications for this visit.    Allergies-reviewed and updated Allergies  Allergen Reactions  . Metoclopramide Other (See Comments)    Interference with Sinemet, blocks dopamine leading to sedation    History   Social History  . Marital Status: Married    Spouse Name: N/A    Number of Children: N/A  . Years of Education: N/A   Occupational History  . retired Development worker, community carrier    Social History Main Topics  . Smoking status: Never Smoker   . Smokeless tobacco: Never Used  . Alcohol Use: No  . Drug Use: No  . Sexual Activity: None   Other Topics Concern  . None   Social History Narrative   Married. 3 children. 6 grandkids and 1 stepgrandson. No greatgrandkids.       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 [place] [employer]. [employer].           ROS--See HPI   Objective: BP 122/68 mmHg  Pulse 70  Temp(Src) 97.6 F (36.4 C)  Wt 237 lb (107.502 kg) Gen: NAD, resting comfortably in chair, leans to right HEENT: Mucous membranes are moist. CV: RRR no murmurs rubs or gallops Lungs: CTAB no crackles, wheeze, rhonchi Abdomen: distended but not tight. Very active bowel sounds. No rebound or guarding.  Ext: 2-3+ pitting edema Skin: warm, dry, no rash Neuro: grossly normal, moves all extremities   Assessment/Plan:  Chronic pain syndrome D/c narcotics due to severe fecal impaction leading to 5 day hospitalization. Patient currently trying to manage pain with Acetaminophen 625mg  x 2 TID. Pain is mildly poor controlled but given ongoing bowel issues. Plan to continue current regimen.   Constipation Severe leading to hospitalization. Stomach  is upset and he cannot go anywhere on current regimen including: Lactulose 10g/1 oz Miralax 1 capful twice a day Bacid probiotic Magnesium oxide 400mg . Taking 500mg  tablets Dulcolax 2 tablets 5 mg tablets daily.   Alternatively, do not want to have patient return to the hospital. He has planned GI follow up on Monday and I will not make any current changes but would consider reduction in overall regimen with rescue instead of 5 separate medications but await GI expertise. Family is  very concerned about potential cancer as cause of obstruction but report colonoscopy limited by his bowel's orientation. In follow up, may need to consider imaging such as CT once less constipated-also would like to hear GI's opinion on this.   Prevnar next visit. Follow up 1 month.   >50% of 25 minute office visit was spent on counseling (Stress of dealing with constipation and now with volatile bowels and effects on social life, dealing with chronic pain off narcotics) and coordination of care

## 2014-12-01 NOTE — Patient Instructions (Signed)
Sorry you have been through so much lately.   I hope we are able to get some answers for you through your GI visit next week.   I am here for you if you need anything (out week after Christmas)  To check on your progress I'd like to see you back in a month.   You will be in my thoughts and prayers for your bowels as well as for your fatigue

## 2014-12-02 NOTE — Assessment & Plan Note (Signed)
Severe leading to hospitalization. Stomach is upset and he cannot go anywhere on current regimen including: Lactulose 10g/1 oz Miralax 1 capful twice a day Bacid probiotic Magnesium oxide 400mg . Taking 500mg  tablets Dulcolax 2 tablets 5 mg tablets daily.   Alternatively, do not want to have patient return to the hospital. He has planned GI follow up on Monday and I will not make any current changes but would consider reduction in overall regimen with rescue instead of 5 separate medications but await GI expertise. Family is very concerned about potential cancer as cause of obstruction but report colonoscopy limited by his bowel's orientation. In follow up, may need to consider imaging such as CT once less constipated-also would like to hear GI's opinion on this.

## 2014-12-02 NOTE — Assessment & Plan Note (Signed)
D/c narcotics due to severe fecal impaction leading to 5 day hospitalization. Patient currently trying to manage pain with Acetaminophen 625mg  x 2 TID. Pain is mildly poor controlled but given ongoing bowel issues. Plan to continue current regimen.

## 2014-12-03 IMAGING — CR DG CHEST 2V
2 series · 2 of 2 positions shown · non-contrast
Comparison: 05/13/2013, 05/18/2009, abdominal CT 10/24/2014

CLINICAL DATA: 80-year-old male with history of weakness. No
current chest complaints.

EXAM:
CHEST - 2 VIEW

[w chest pa]
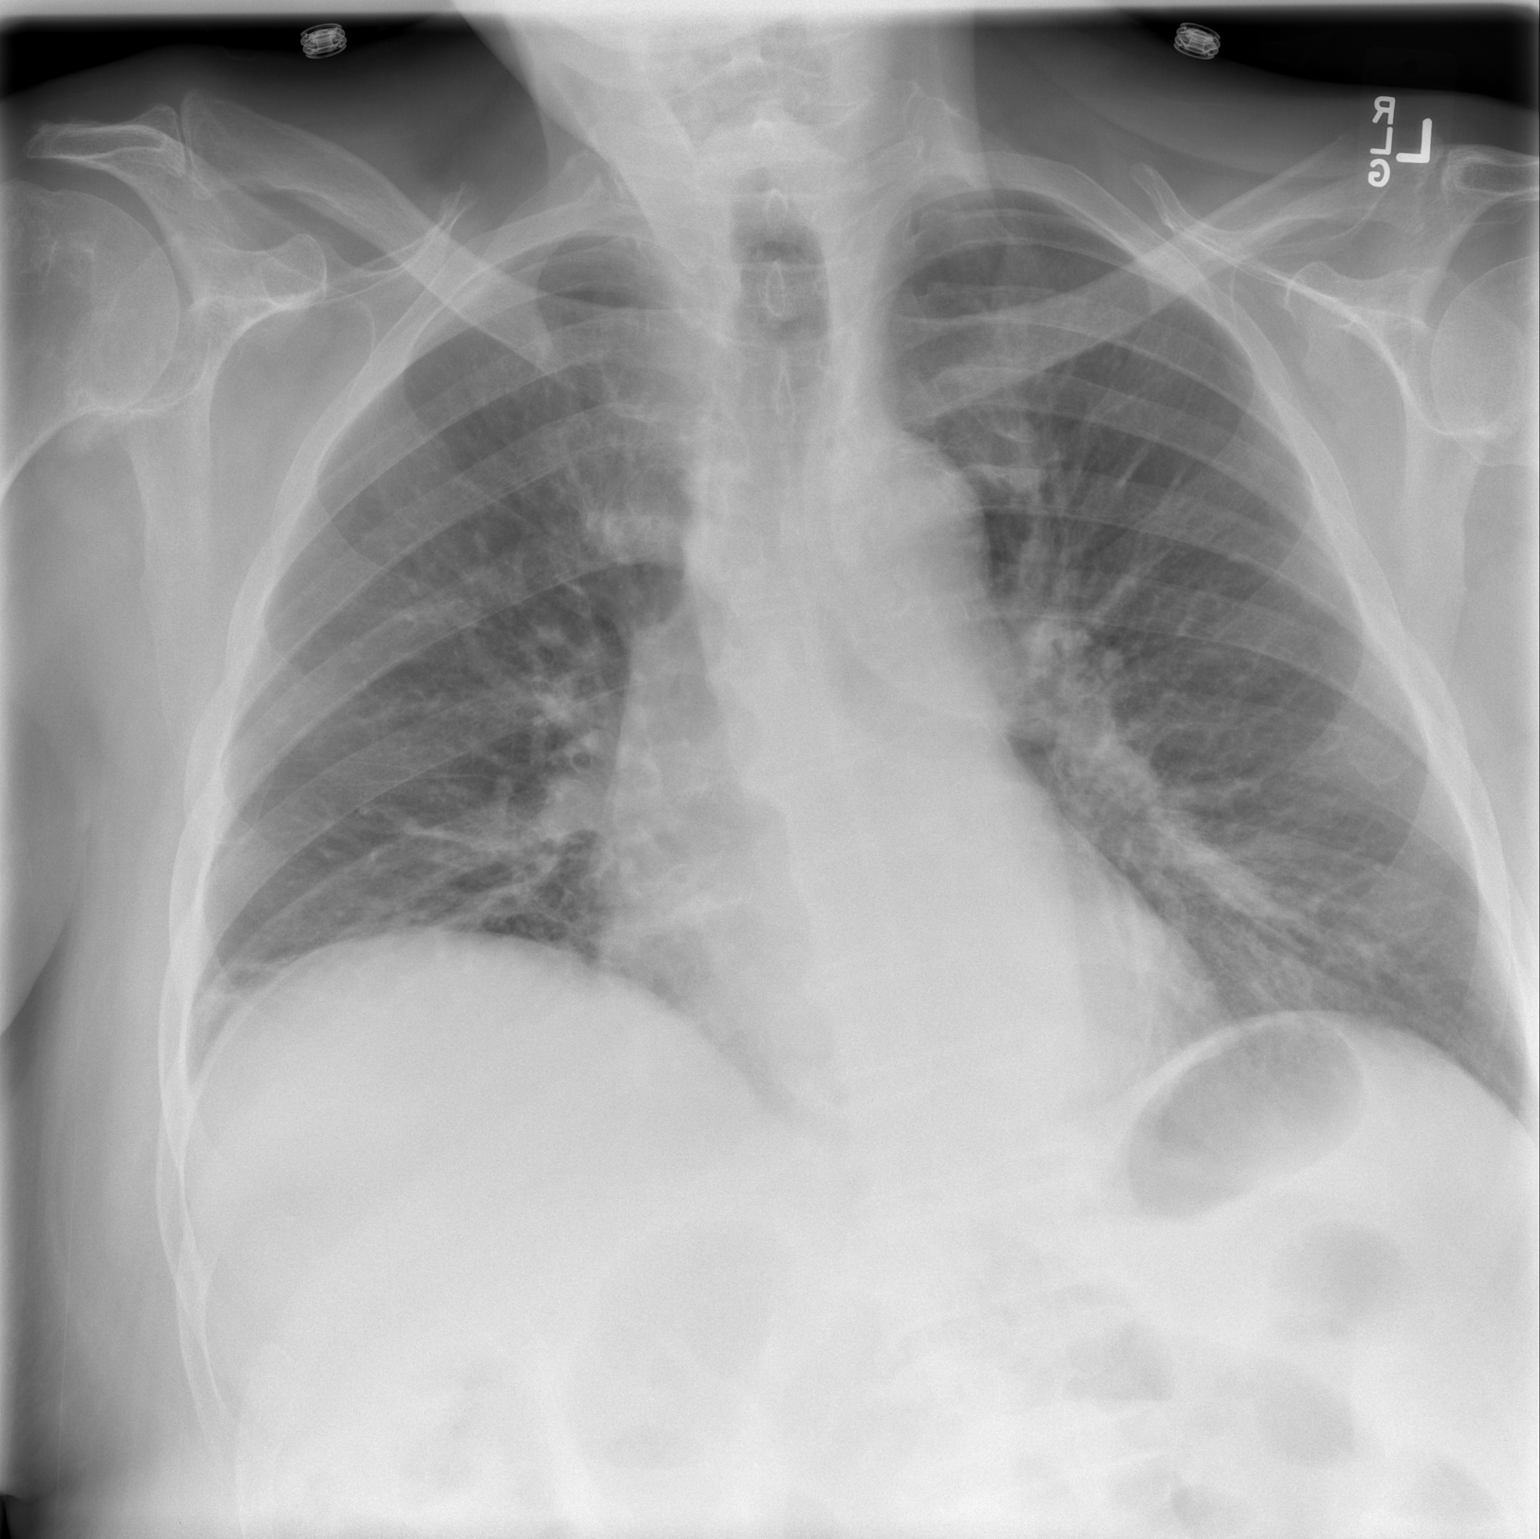

[w chest lat]
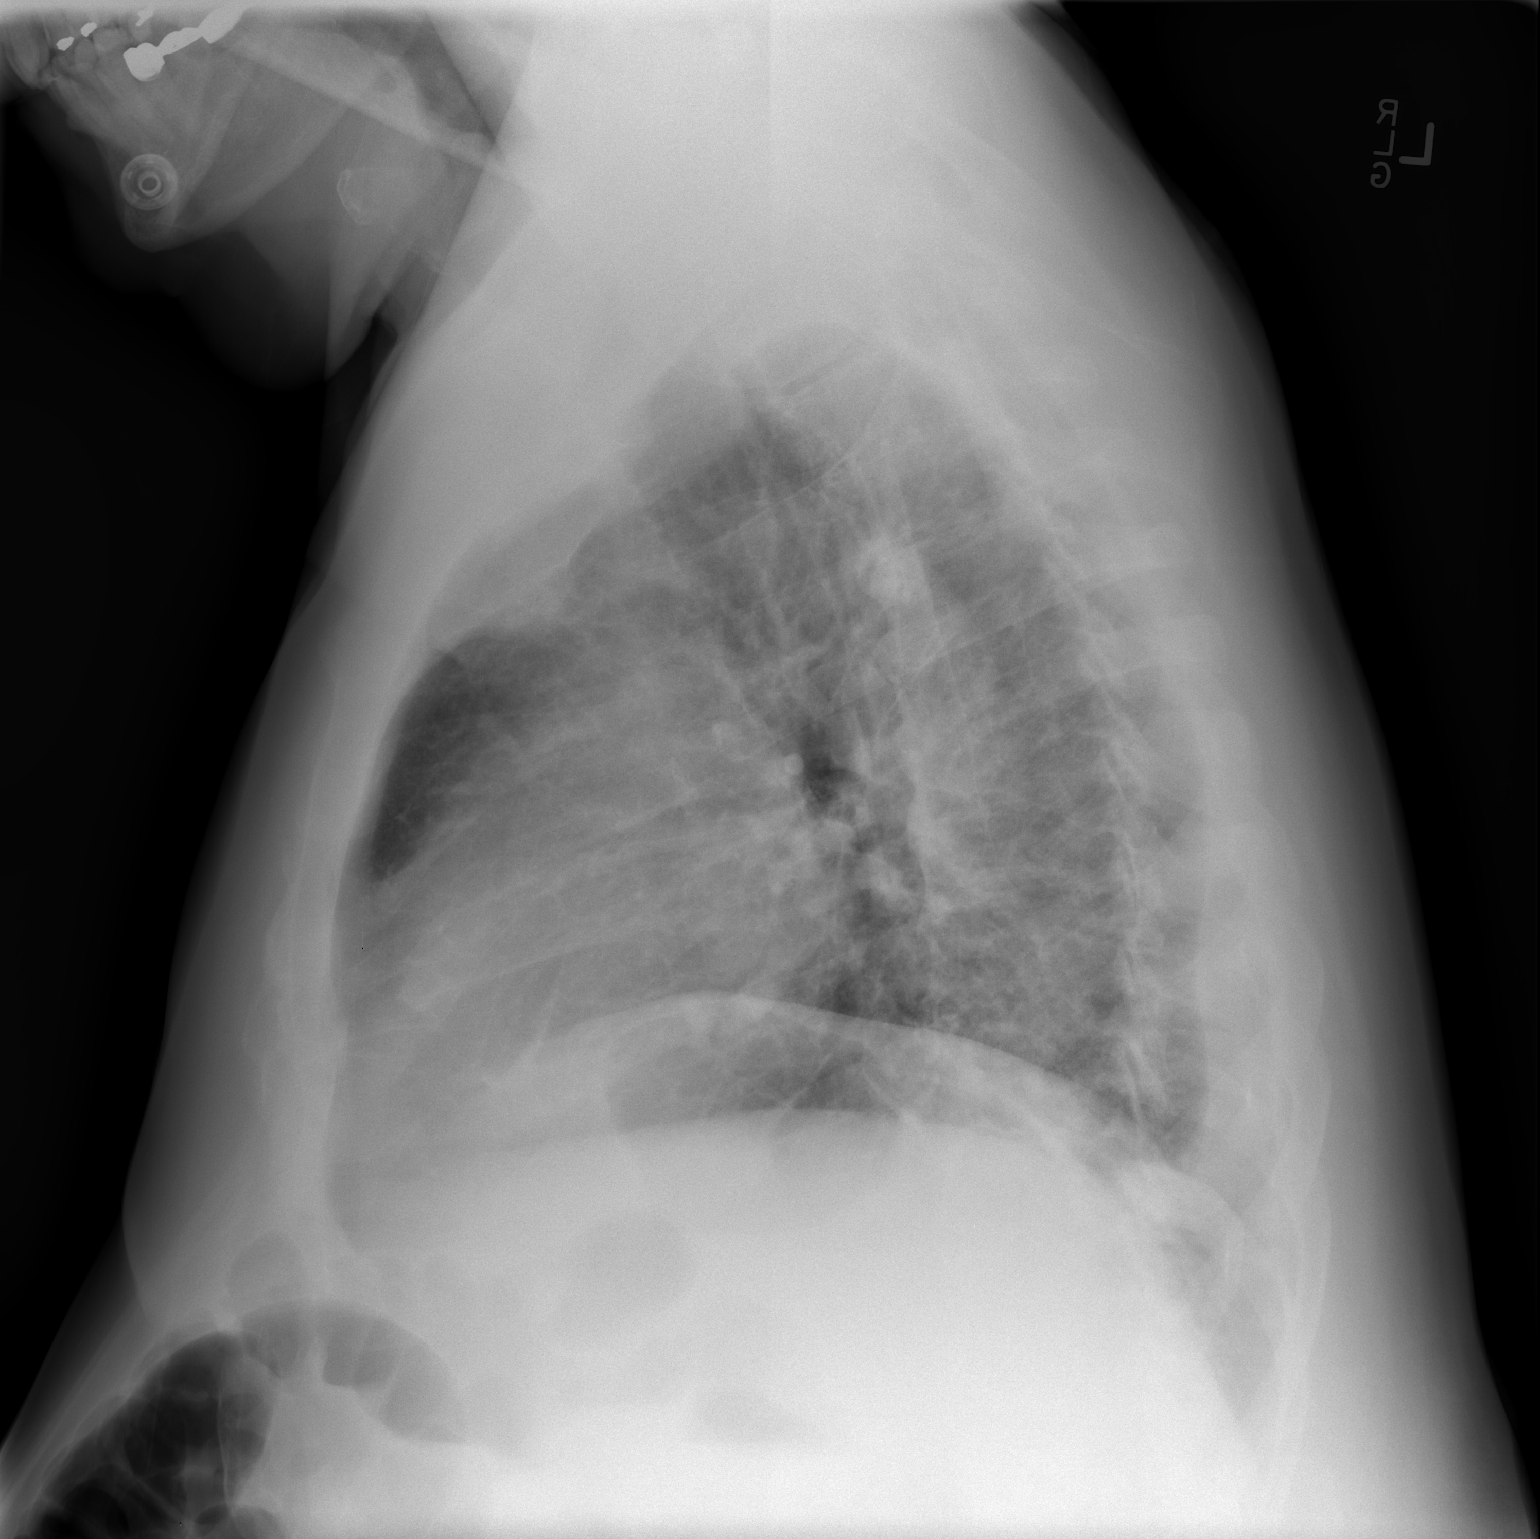

[2 of 2 positions shown; findings below may reference images not displayed]

FINDINGS: Cardiomediastinal silhouette unchanged in size and contour. No
evidence of central pulmonary vascular congestion.

No visualized pneumothorax or pleural effusion.

Lung volumes are low, with linear opacities at the costophrenic
angle on the right. Coarsened interstitial markings persist as
compared to prior plain film. No confluent airspace disease.

No displaced fracture.

Scoliotic curvature of the thoracic spine, with apex right curvature
centered at T4-T5.

Atherosclerotic calcifications.
IMPRESSION: No radiographic evidence of acute cardiopulmonary disease, with the
appearance of bibasilar linear opacities likely related to low lung
volumes and atelectasis/scarring.

Atherosclerosis

## 2014-12-08 ENCOUNTER — Ambulatory Visit (INDEPENDENT_AMBULATORY_CARE_PROVIDER_SITE_OTHER): Payer: Medicare Other | Admitting: Physician Assistant

## 2014-12-08 ENCOUNTER — Encounter: Payer: Self-pay | Admitting: Physician Assistant

## 2014-12-08 VITALS — BP 158/80 | HR 72 | Ht 69.0 in | Wt 232.5 lb

## 2014-12-08 DIAGNOSIS — K5909 Other constipation: Secondary | ICD-10-CM

## 2014-12-08 DIAGNOSIS — K5900 Constipation, unspecified: Secondary | ICD-10-CM | POA: Diagnosis not present

## 2014-12-08 NOTE — Progress Notes (Signed)
Patient ID: Walter Gill, male   DOB: [birth year], 80 y.o.   MRN: [id number]   Subjective:    Patient ID: Walter Gill, male    DOB: 01-23-1934, 80 y.o.   MRN: [id number]  HPI Walter Gill is a very nice 80 year old white male known to Dr. Deatra Gill. He has history of Parkinson's disease, sleep apnea, chronic pain syndrome, congestive heart failure, prior pulmonary embolus, and hyperlipidemia. He had undergone sigmoidoscopy remotely in 1997 finding of a tortuous colon with diverticulosis. This was an incomplete exam due to tortuosity and he had subsequent BE which was negative other than diverticulosis. He has not had reevaluation since. He has had ongoing chronic problems with constipation. He had been using MiraLAX on a regular basis and when that was not working he was called in a trial of November take per Dr. Deatra Gill. He wound up in the Hospital shortly thereafter with weakness and altered mental status and was ultimately found to have urinary tract infection and a fecal impaction. He was disimpacted and then started on twice daily MiraLAX in addition to Dulcolax. He was discharged to a rehabilitation facility. He comes in today stating his been out of rehabilitation over the past week. He does feel stronger but is having ongoing problems with his bowels. He has been on a regimen of to Dulcolax daily, 2 doses of MiraLAX daily, probiotic daily magnesium 1000 mg daily and Chronulac 10 g daily since rehabilitation. He says he is now having 3-4 explosive loose  bowel movements per day he has no control. His wife is asked because they're not able to leave the house much because of the unpredictability of gas and bowel movements. He has been eating without difficulty still couldn't planes of some problems with bloating.  Review of Systems Pertinent positive and negative review of systems were noted in the above HPI section.  All other review of systems was otherwise negative.  Outpatient Encounter Prescriptions  as of 12/08/2014  Medication Sig  . Acetaminophen 650 MG TABS Take 650 mg by mouth 3 (three) times daily. Pt takes 2 pills, three times a day  . allopurinol (ZYLOPRIM) 100 MG tablet TAKE 1 TABLET BY MOUTH EVERY DAY  . AMBULATORY NON FORMULARY MEDICATION 1 Units by Does not apply route daily. Set CPAP to 12  . aspirin 81 MG chewable tablet Chew 81 mg by mouth at bedtime.   . bisacodyl (DULCOLAX) 5 MG EC tablet Take 2 tablets (10 mg total) by mouth every morning.  . carbidopa-levodopa (SINEMET CR) 50-200 MG per tablet Take 1 tablet by mouth 5 (five) times daily.  . furosemide (LASIX) 40 MG tablet Take 40 mg by mouth 2 (two) times daily.  . Magnesium Oxide 500 MG TABS Take 500 mg by mouth 2 (two) times daily.  . Mirabegron ER (MYRBETRIQ) 25 MG TB24 Take 25 mg by mouth at bedtime.   . nitrofurantoin, macrocrystal-monohydrate, (MACROBID) 100 MG capsule Take 100 mg by mouth at bedtime.   . polyethylene glycol (MIRALAX / GLYCOLAX) packet Take 17 g by mouth daily. (Patient taking differently: Take 17 g by mouth 2 (two) times daily. )  . potassium chloride SA (K-DUR,KLOR-CON) 20 MEQ tablet Take 20 mEq by mouth 2 (two) times daily.  . Probiotic Product (PROBIOTIC DAILY PO) Take 2 capsules by mouth daily. Pt uses Risa-Bid (Bacid caplet)  . rosuvastatin (CRESTOR) 10 MG tablet Take 10 mg by mouth daily.  . selegiline (ELDEPRYL) 5 MG capsule Take 5 mg by mouth 2 (two)  times daily.  . tamsulosin (FLOMAX) 0.4 MG CAPS capsule Take 0.4 mg by mouth at bedtime.  . [DISCONTINUED] lactulose (CHRONULAC) 10 GM/15ML solution Take 10 g by mouth as needed for mild constipation.   Allergies  Allergen Reactions  . Metoclopramide Other (See Comments)    Interference with Sinemet, blocks dopamine leading to sedation   Patient Active Problem List   Diagnosis Date Noted  . Hyperlipidemia 10/29/2014  . Chronic diastolic heart failure 47/65/4650  . Constipation 10/24/2014  . Chronic pain syndrome 10/01/2014  . B12  deficiency 06/06/2013  . OSA on CPAP 04/17/2013  . REM behavioral disorder 10/25/2012  . DVT, HX OF 10/16/2007  . PULMONARY EMBOLISM 10/08/2007  . Gout 07/02/2007  . PARKINSON'S DISEASE 07/02/2007  . RBBB 07/02/2007  . Osteoarthritis 07/02/2007   History   Social History  . Marital Status: Married    Spouse Name: N/A    Number of Children: N/A  . Years of Education: N/A   Occupational History  . retired Development worker, community carrier    Social History Main Topics  . Smoking status: Never Smoker   . Smokeless tobacco: Never Used  . Alcohol Use: No  . Drug Use: No  . Sexual Activity: Not on file   Other Topics Concern  . Not on file   Social History Narrative   Married. 3 children. 6 grandkids and 1 stepgrandson. No greatgrandkids.       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 [place] social [employer]. [employer].           Mr. Walter Gill family history includes Cancer in his father; Heart disease in his mother.      Objective:    Filed Vitals:   12/08/14 1446  BP: 158/80  Pulse: 72    Physical Exam well-developed elderly white male in no acute distress, ambulates with a walker accompanied by his wife vitals as above height 5 foot 9 weight 232. HEENT; nontraumatic normocephalic EOMI PERRLA sclera anicteric, Supple; no JVD, Cardiovascular; regular rate and rhythm with P5-W6 soft systolic murmur, Pulmonary ;clear bilaterally, Abdomen; large obese soft nondistended bowel sounds are somewhat hyperactive there is no palpable mass or hepatosplenomegaly, Rectal; exam not done, Extremities 1+ edema bilateral ankles, Neuropsych; mood and affect appropriate     Assessment & Plan:   #35 80 year old male with Parkinson's disease with chronic constipation and recent hospitalization with urinary tract infection and fecal impaction. He was placed on multiple agents for his bowels while in rehabilitation and is now having 3-4  close of loose bowel movements per day. #2 CHF #3 chronic pain syndrome #4 obstructive sleep apnea #5 history of DVT and PE #6 diverticulosis with tortuous colon  Plan; we will tweak his regimen to try to keep his bowels moving but not to the extent of having explosive loose stools. Decrease MiraLAX to 17 g once daily Stop Chronulac Continue Dulcolax 2 tablets daily, probiotic daily, magnesium supplement 1000 milligrams daily Will ask patient and wife to call back in one week with a progress report and then decide on need to further decrease laxatives.   Snyder Colavito Genia Harold PA-C 12/08/2014

## 2014-12-08 NOTE — Patient Instructions (Signed)
Stop Lactulose.   Decrease Miralax to once a day.  Please call back in one week with an update on how you are feeling, you can ask for Amy's nurse Beth.

## 2014-12-14 NOTE — Progress Notes (Signed)
Reviewed and agree with management. Robert D. Kaplan, M.D., FACG  

## 2014-12-15 ENCOUNTER — Telehealth: Payer: Self-pay | Admitting: Physician Assistant

## 2014-12-15 ENCOUNTER — Ambulatory Visit (INDEPENDENT_AMBULATORY_CARE_PROVIDER_SITE_OTHER): Payer: Medicare Other | Admitting: Neurology

## 2014-12-15 ENCOUNTER — Encounter: Payer: Self-pay | Admitting: Neurology

## 2014-12-15 VITALS — BP 140/80 | HR 72 | Ht 68.0 in | Wt 235.0 lb

## 2014-12-15 DIAGNOSIS — E538 Deficiency of other specified B group vitamins: Secondary | ICD-10-CM | POA: Diagnosis not present

## 2014-12-15 DIAGNOSIS — G20 Parkinson's disease: Secondary | ICD-10-CM

## 2014-12-15 DIAGNOSIS — G4733 Obstructive sleep apnea (adult) (pediatric): Secondary | ICD-10-CM | POA: Diagnosis not present

## 2014-12-15 DIAGNOSIS — G5601 Carpal tunnel syndrome, right upper limb: Secondary | ICD-10-CM | POA: Diagnosis not present

## 2014-12-15 DIAGNOSIS — G4752 REM sleep behavior disorder: Secondary | ICD-10-CM | POA: Diagnosis not present

## 2014-12-15 DIAGNOSIS — Z9989 Dependence on other enabling machines and devices: Secondary | ICD-10-CM

## 2014-12-15 DIAGNOSIS — G20A1 Parkinson's disease without dyskinesia, without mention of fluctuations: Secondary | ICD-10-CM

## 2014-12-15 MED ORDER — CYANOCOBALAMIN 1000 MCG/ML IJ SOLN
1000.0000 ug | Freq: Once | INTRAMUSCULAR | Status: AC
  Administered 2014-12-15: 1000 ug via INTRAMUSCULAR

## 2014-12-15 MED ORDER — WRIST SPLINT/COCK-UP/RIGHT L MISC: 1.0000 | Freq: Every day | Status: DC
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
## 2014-12-16 NOTE — Telephone Encounter (Signed)
Left message to call if things fail to continue to improve or if they acutely worsen

## 2014-12-22 DIAGNOSIS — R350 Frequency of micturition: Secondary | ICD-10-CM | POA: Diagnosis not present

## 2014-12-22 DIAGNOSIS — N401 Enlarged prostate with lower urinary tract symptoms: Secondary | ICD-10-CM | POA: Diagnosis not present

## 2014-12-22 DIAGNOSIS — N3281 Overactive bladder: Secondary | ICD-10-CM | POA: Diagnosis not present

## 2014-12-23 ENCOUNTER — Other Ambulatory Visit: Payer: Self-pay | Admitting: Internal Medicine

## 2015-01-02 ENCOUNTER — Ambulatory Visit: Payer: Medicare Other | Admitting: Family Medicine

## 2015-01-14 ENCOUNTER — Encounter: Payer: Self-pay | Admitting: Family Medicine

## 2015-01-14 ENCOUNTER — Ambulatory Visit (INDEPENDENT_AMBULATORY_CARE_PROVIDER_SITE_OTHER): Payer: Medicare Other | Admitting: Family Medicine

## 2015-01-14 VITALS — BP 136/78 | Temp 98.0°F | Wt 238.0 lb

## 2015-01-14 DIAGNOSIS — K5901 Slow transit constipation: Secondary | ICD-10-CM

## 2015-01-14 DIAGNOSIS — M150 Primary generalized (osteo)arthritis: Secondary | ICD-10-CM | POA: Diagnosis not present

## 2015-01-14 DIAGNOSIS — M159 Polyosteoarthritis, unspecified: Secondary | ICD-10-CM

## 2015-01-14 DIAGNOSIS — R32 Unspecified urinary incontinence: Secondary | ICD-10-CM | POA: Insufficient documentation

## 2015-01-14 NOTE — Progress Notes (Signed)
[name], MD Phone: [phone]  Subjective:   Walter Gill is a 81 y.o. year old very pleasant male patient who presents with the following:  Constipation-improved Stool coming out soft, going 2-3x a day, not to level of explosiveness miralax once a day, no lactulose, continue dulcolax, probiotic Much more manageable-able to get out and go places.   Arthritis-primarily knees. Reasonable control.  Continued pain 1300 mg tylenol 3x a day. Dr. Carles Collet was concerned about level of dose and requested patient discuss with [place].   ROS- no unintentional weight loss, fever, chills, night sweats.   Past Medical History- Patient Active Problem List   Diagnosis Date Noted  . Chronic diastolic heart failure 78/24/2353    Priority: High  . Chronic pain syndrome 10/01/2014    Priority: High  . PARKINSON'S DISEASE 07/02/2007    Priority: High  . Urinary incontinence 01/14/2015    Priority: Medium  . Hyperlipidemia 10/29/2014    Priority: Medium  . Constipation 10/24/2014    Priority: Medium  . OSA on CPAP 04/17/2013    Priority: Medium  . DVT, HX OF 10/16/2007    Priority: Medium  . PULMONARY EMBOLISM 10/08/2007    Priority: Medium  . Gout 07/02/2007    Priority: Medium  . B12 deficiency 06/06/2013    Priority: Low  . REM behavioral disorder 10/25/2012    Priority: Low  . RBBB 07/02/2007    Priority: Low  . Osteoarthritis 07/02/2007    Priority: Low   Medications- reviewed and updated Current Outpatient Prescriptions  Medication Sig Dispense Refill  . acetaminophen (TYLENOL) 650 MG CR tablet Take 1,300 mg by mouth every 8 (eight) hours.    Marland Kitchen allopurinol (ZYLOPRIM) 100 MG tablet TAKE 1 TABLET BY MOUTH EVERY DAY 30 tablet 0  . AMBULATORY NON FORMULARY MEDICATION 1 Units by Does not apply route daily. Set CPAP to 12 1 Units 0  . aspirin 81 MG chewable tablet Chew 81 mg by mouth at bedtime.     . bisacodyl (DULCOLAX) 5 MG EC tablet Take 2 tablets (10 mg total) by mouth  every morning. 30 tablet 0  . carbidopa-levodopa (SINEMET CR) 50-200 MG per tablet Take 1 tablet by mouth 5 (five) times daily. 450 tablet 3  . Elastic Bandages & Supports (WRIST SPLINT/COCK-UP/RIGHT L) MISC 1 Device by Does not apply route daily. 1 each 0  . furosemide (LASIX) 40 MG tablet Take 40 mg by mouth 2 (two) times daily.    . Magnesium Oxide 500 MG TABS Take 500 mg by mouth 2 (two) times daily.    . Mirabegron ER (MYRBETRIQ) 25 MG TB24 Take 25 mg by mouth at bedtime.     . nitrofurantoin, macrocrystal-monohydrate, (MACROBID) 100 MG capsule Take 100 mg by mouth at bedtime.     . potassium chloride SA (K-DUR,KLOR-CON) 20 MEQ tablet Take 20 mEq by mouth 2 (two) times daily.    . Probiotic Product (PROBIOTIC DAILY PO) Take 2 capsules by mouth daily. Pt uses Risa-Bid (Bacid caplet)    . rosuvastatin (CRESTOR) 10 MG tablet Take 10 mg by mouth daily.    . selegiline (ELDEPRYL) 5 MG capsule Take 5 mg by mouth 2 (two) times daily.    . tamsulosin (FLOMAX) 0.4 MG CAPS capsule Take 0.4 mg by mouth at bedtime.    . polyethylene glycol (MIRALAX / GLYCOLAX) packet Take 17 g by mouth daily. (Patient not taking: Reported on 01/14/2015) 14 each 0   No current facility-administered medications for this  visit.    Objective: BP 136/78 mmHg  Temp(Src) 98 F (36.7 C)  Wt 238 lb (107.956 kg) Gen: NAD, resting comfortably CV: RRR no murmurs rubs or gallops Lungs: CTAB no crackles, wheeze, rhonchi Abdomen: soft/obese/nondistended/normal bowel sounds. No rebound or guarding.  Ext: 1-2+ pitting edema Skin: warm, dry, no rash Neuro:  shuffling gait, uses walker   Assessment/Plan:  Constipation Patient believes current regimen much more effective. Lactulose was cut out and miralax reduced to once daily. He is to continue miralax, dulcolax, probiotic, magnesium. With this going 2-3 x a day but not explosive and much more reasonable for his lifestyle. Family had heightened concern about malignancy at last  visit but much lower concern today. I had asked for GI's opinion on this but not mentioned in a/p of last note. I do not hav a high level of concern but could certainly consider CT in future.    Osteoarthritis Patient's pain is reasonable controlled. His knees are probably the worst area. He is only taking Tylenol ER  650mg  x2 TID. This is the max dose of this version of tylenol. We discussed perhaps trying 650x 2 BID with 1 dose other time of the day. Patient with all of his medications not willing to take lower dose more frequently. Will forward infromation to Dr. Carles Collet:  Immediate-release: Regular strength: 650 mg every 4 to 6 hours; maximum daily dose: 3250 mg daily unless directed by health care provider; under health care provider supervision, daily doses ?4 g may be used  Extra strength: 1000 mg every 6 hours; maximum daily dose: 3000 mg daily unless directed by a health care provider; under health care provider supervision, daily doses ?4 g may be used Extended-release: 1300 mg every 8 hours; maximum daily dose: 3900 mg daily          Return precautions advised. 6 month follow up planned. We also discussed using compression stockings and elevation for edema and return precautions for diastolic CHF. Would not want to increase lasix dose beyond 80mg  daily at present.

## 2015-01-14 NOTE — Patient Instructions (Signed)
No changes today except trialing potassium pill instead of crushing/diluting. If constipation worsens, obviously go back to other regimen.  We discussed benefits/risks of tylenol at level  You are at but it seems to be the best option givne your constipation issues.

## 2015-01-14 NOTE — Assessment & Plan Note (Signed)
Patient believes current regimen much more effective. Lactulose was cut out and miralax reduced to once daily. He is to continue miralax, dulcolax, probiotic, magnesium. With this going 2-3 x a day but not explosive and much more reasonable for his lifestyle. Family had heightened concern about malignancy at last visit but much lower concern today. I had asked for GI's opinion on this but not mentioned in a/p of last note. I do not hav a high level of concern but could certainly consider CT in future.

## 2015-01-14 NOTE — Assessment & Plan Note (Signed)
Patient's pain is reasonable controlled. His knees are probably the worst area. He is only taking Tylenol ER  650mg  x2 TID. This is the max dose of this version of tylenol. We discussed perhaps trying 650x 2 BID with 1 dose other time of the day. Patient with all of his medications not willing to take lower dose more frequently. Will forward infromation to Dr. Carles Collet:  Immediate-release: Regular strength: 650 mg every 4 to 6 hours; maximum daily dose: 3250 mg daily unless directed by health care provider; under health care provider supervision, daily doses ?4 g may be used  Extra strength: 1000 mg every 6 hours; maximum daily dose: 3000 mg daily unless directed by a health care provider; under health care provider supervision, daily doses ?4 g may be used Extended-release: 1300 mg every 8 hours; maximum daily dose: 3900 mg daily

## 2015-01-15 ENCOUNTER — Ambulatory Visit (INDEPENDENT_AMBULATORY_CARE_PROVIDER_SITE_OTHER): Payer: Medicare Other | Admitting: Neurology

## 2015-01-15 DIAGNOSIS — E538 Deficiency of other specified B group vitamins: Secondary | ICD-10-CM | POA: Diagnosis not present

## 2015-01-15 MED ORDER — CYANOCOBALAMIN 1000 MCG/ML IJ SOLN
1000.0000 ug | Freq: Once | INTRAMUSCULAR | Status: AC
  Administered 2015-01-15: 1000 ug via INTRAMUSCULAR

## 2015-01-15 NOTE — Progress Notes (Signed)
B12 injection to left deltoid with no apparent complications.   

## 2015-01-20 ENCOUNTER — Other Ambulatory Visit: Payer: Self-pay | Admitting: Family Medicine

## 2015-01-26 ENCOUNTER — Ambulatory Visit: Payer: Medicare Other | Admitting: Neurology

## 2015-01-29 ENCOUNTER — Other Ambulatory Visit: Payer: Self-pay | Admitting: Internal Medicine

## 2015-02-13 ENCOUNTER — Ambulatory Visit (INDEPENDENT_AMBULATORY_CARE_PROVIDER_SITE_OTHER): Payer: Medicare Other | Admitting: *Deleted

## 2015-02-13 DIAGNOSIS — E538 Deficiency of other specified B group vitamins: Secondary | ICD-10-CM | POA: Diagnosis not present

## 2015-02-13 MED ORDER — CYANOCOBALAMIN 1000 MCG/ML IJ SOLN
1000.0000 ug | Freq: Once | INTRAMUSCULAR | Status: AC
  Administered 2015-02-13: 1000 ug via INTRAMUSCULAR

## 2015-03-12 ENCOUNTER — Ambulatory Visit: Payer: Medicare Other | Admitting: Occupational Therapy

## 2015-03-12 ENCOUNTER — Ambulatory Visit: Payer: Medicare Other

## 2015-03-12 ENCOUNTER — Ambulatory Visit: Payer: Medicare Other | Attending: Neurology | Admitting: Physical Therapy

## 2015-03-12 DIAGNOSIS — G20 Parkinson's disease: Secondary | ICD-10-CM

## 2015-03-12 DIAGNOSIS — R490 Dysphonia: Secondary | ICD-10-CM

## 2015-03-12 DIAGNOSIS — R279 Unspecified lack of coordination: Secondary | ICD-10-CM

## 2015-03-12 DIAGNOSIS — R269 Unspecified abnormalities of gait and mobility: Secondary | ICD-10-CM

## 2015-03-12 NOTE — Therapy (Signed)
[facility] [address] [place], [place], [address] Phone: [phone]   Fax:  [phone]  Patient Details  Name: Walter Gill MRN: [id number] Date of Birth: August 02, 1934 Referring Provider:  Alonza Bogus, D.O.  Encounter Date: 03/12/2015    Speech Therapy Parkinson's Disease Screen  Date: 03-12-15    Decibel Level today: 71dB  (WNL=70-72 dB) with sound level meter 30cm away from pt's mouth. Pt's conversational volume is WNL at this time.  Pt has not experienced difficulty in swallowing warranting objective evaluation.  Pt does does not require speech therapy services at this time. Recommend ST screen in another 6 months.    [facility] 03/12/2015, 10:30 AM  [facility] [address] [place], [place], [address] Phone: [phone]   Fax:  [phone]

## 2015-03-12 NOTE — Therapy (Signed)
[facility] [address] [place], [place], [address] Phone: [phone]   Fax:  [phone]  Patient Details  Name: Walter Gill MRN: [id number] Date of Birth: 1934/01/08 Referring Provider:  Lisabeth Pick, MD  Encounter Date: 03/12/2015 Physical Therapy Parkinson's Disease Screen   Timed Up and Go test:24.62 seconds  10 meter walk test:2.21 ft/sec  5 time sit to stand test:unable without UE support   Patient does not require Physical Therapy services at this time.  Recommend Physical Therapy screen in 6 months.  [facility] has [facility], and pt's numbers as above are similar to at discharge from PT in summer 2015.      Zafir Schauer W. 03/12/2015, 10:53 AM  Mady Haagensen, PT 03/12/2015 10:54 AM Phone: [phone] Fax: [facility] [address] [place], [place], [address] Phone: [phone]   Fax:  [phone]

## 2015-03-12 NOTE — Therapy (Signed)
[facility] [address] [place], [place], [address] Phone: [phone]   Fax:  [phone]  Patient Details  Name: Walter Gill MRN: [id number] Date of Birth: 1934/08/03 Referring Provider:  Marin Olp, MD  Encounter Date: 03/12/2015  Occupational Therapy Parkinson's Disease Screen  Physical Performance Test item #2 (simulated eating):  11.94sec  9-hole peg test:    RUE  51.41sec        LUE  42.06sec   Box & Blocks Test:   RUE  41 blocks        LUE  42 blocks  Change in ability to perform ADLs/IADLs:  Denies changes    Other Comments:  Pt reports hospitalization with rehab admission in 11/15.  Pt would benefit from occupational therapy evaluation due to  Decrease in coordination and pt reports possible CTS affecting R hand use.  However, pt does not wish to pursue occupational therapy at this time.  Recommended occupational therapy screen in   approx 6 months.  Recommended pt wear wrist splint as instructed by Dr. Carles Collet and perform coordination HEP consistently.  [facility] 03/12/2015, 10:47 AM  [facility] [address] [place], [place], [address] Phone: [phone]   Fax:  [phone]

## 2015-03-16 ENCOUNTER — Ambulatory Visit (INDEPENDENT_AMBULATORY_CARE_PROVIDER_SITE_OTHER): Payer: Medicare Other | Admitting: Neurology

## 2015-03-16 ENCOUNTER — Telehealth: Payer: Self-pay

## 2015-03-16 DIAGNOSIS — E538 Deficiency of other specified B group vitamins: Secondary | ICD-10-CM | POA: Diagnosis not present

## 2015-03-16 MED ORDER — FUROSEMIDE 40 MG PO TABS: 40.0000 mg | ORAL_TABLET | Freq: Two times a day (BID) | ORAL | Status: DC

## 2015-03-16 MED ORDER — CYANOCOBALAMIN 1000 MCG/ML IJ SOLN
1000.0000 ug | Freq: Once | INTRAMUSCULAR | Status: AC
  Administered 2015-03-16: 1000 ug via INTRAMUSCULAR

## 2015-03-16 MED ORDER — ALLOPURINOL 100 MG PO TABS: 100.0000 mg | ORAL_TABLET | Freq: Every day | ORAL | Status: DC

## 2015-03-16 NOTE — Telephone Encounter (Signed)
Medications refilled

## 2015-03-16 NOTE — Telephone Encounter (Signed)
[facility] refill request for FUROSEMIDE 40MG 

## 2015-03-16 NOTE — Progress Notes (Signed)
B12 injection to left deltoid with no apparent complications.   

## 2015-03-16 NOTE — Telephone Encounter (Signed)
Also a request for a 90 day supply of ALLOPURINOL 100MG 

## 2015-03-30 ENCOUNTER — Telehealth: Payer: Self-pay | Admitting: Neurology

## 2015-03-30 NOTE — Telephone Encounter (Signed)
Pt wife Bevely Palmer called and states that the patient has cold and wants to know what to so please call [phone]

## 2015-03-30 NOTE — Telephone Encounter (Signed)
Spoke with patient and he wanted to know if there was any over the counter cold medications that he could not take due to his Parkinson's Disease. Made aware he should stay away from Sudafed due to him being on Selegeline per Dr Tat and he will call if needed.

## 2015-03-31 ENCOUNTER — Ambulatory Visit (INDEPENDENT_AMBULATORY_CARE_PROVIDER_SITE_OTHER): Payer: Medicare Other | Admitting: Family Medicine

## 2015-03-31 ENCOUNTER — Encounter: Payer: Self-pay | Admitting: Family Medicine

## 2015-03-31 VITALS — BP 140/86 | HR 78 | Temp 98.1°F | Wt 238.0 lb

## 2015-03-31 DIAGNOSIS — J069 Acute upper respiratory infection, unspecified: Secondary | ICD-10-CM

## 2015-03-31 NOTE — Progress Notes (Signed)
PCP: [name], MD  Subjective:  Walter Gill is a 81 y.o. year old very pleasant male patient who presents with Upper Respiratory infection vs. Early sinusitis syptoms including nasal congestion, sore throat, cough primarily dry but productive of yellow sputum in last 24 hours. Starting 2-3 days ago. Thought allergies at first. Mildly worsening through yesterday and had low grade subjective fever though afebrile today. Takes tylenol at baseline which may have helped. Tentative to try anything due to parkinsons and meds. Told not to take sudafed products. Has not tried anything yet other than rest which helps. Multiple sick contacts with similar symptoms. denies flu exposure.   ROS-denies SOB, NVD, tooth pain  Pertinent Past Medical History- Chronic pain, parkinson's disease on selegiline and sinemet, OSA, HLD, hx PE post surgical  Medications- reviewed  Current Outpatient Prescriptions  Medication Sig Dispense Refill  . allopurinol (ZYLOPRIM) 100 MG tablet Take 1 tablet (100 mg total) by mouth daily. 30 tablet 5  . AMBULATORY NON FORMULARY MEDICATION 1 Units by Does not apply route daily. Set CPAP to 12 1 Units 0  . aspirin 81 MG chewable tablet Chew 81 mg by mouth at bedtime.     . carbidopa-levodopa (SINEMET CR) 50-200 MG per tablet Take 1 tablet by mouth 5 (five) times daily. 450 tablet 3  . CRESTOR 10 MG tablet TAKE 1 TABLET DAILY 90 tablet 0  . Elastic Bandages & Supports (WRIST SPLINT/COCK-UP/RIGHT L) MISC 1 Device by Does not apply route daily. 1 each 0  . furosemide (LASIX) 40 MG tablet Take 1 tablet (40 mg total) by mouth 2 (two) times daily. 30 tablet 5  . Magnesium Oxide 500 MG TABS Take 500 mg by mouth 2 (two) times daily.    . Mirabegron ER (MYRBETRIQ) 25 MG TB24 Take 25 mg by mouth at bedtime.     . nitrofurantoin, macrocrystal-monohydrate, (MACROBID) 100 MG capsule Take 100 mg by mouth at bedtime.     . potassium chloride SA (K-DUR,KLOR-CON) 20 MEQ tablet Take 20 mEq  by mouth 2 (two) times daily.    . Probiotic Product (PROBIOTIC DAILY PO) Take 2 capsules by mouth daily. Pt uses Risa-Bid (Bacid caplet)    . selegiline (ELDEPRYL) 5 MG capsule Take 5 mg by mouth 2 (two) times daily.    . tamsulosin (FLOMAX) 0.4 MG CAPS capsule TAKE 1 CAPSULE DAILY 90 capsule 0  . acetaminophen (TYLENOL) 650 MG CR tablet Take 1,300 mg by mouth every 8 (eight) hours.    . bisacodyl (DULCOLAX) 5 MG EC tablet Take 2 tablets (10 mg total) by mouth every morning. (Patient not taking: Reported on 03/31/2015) 30 tablet 0  . polyethylene glycol (MIRALAX / GLYCOLAX) packet Take 17 g by mouth daily. (Patient not taking: Reported on 01/14/2015) 14 each 0   No current facility-administered medications for this visit.    Objective: BP 140/86 mmHg  Pulse 78  Temp(Src) 98.1 F (36.7 C)  Wt 238 lb (107.956 kg)  SpO2 98% Gen: NAD, sits leaning toward the right TM normal bilaterally, pharynx mild erythema, no sinus tenderness, nares with some yellow drainage noted and erythematous.  CV: RRR no murmurs rubs or gallops Lungs: CTAB no crackles, wheeze, rhonchi Abdomen: soft/nontender/nondistended/normal bowel sounds. No rebound or guarding. obese Ext: 2+ pitting edema Skin: warm, dry, venous stasis change  Assessment/Plan:  Upper Respiratory infection Symptomatic care with Mucinex alone given contraindications to dextromethorphan and sudafed on parkinson's meds  History and exam today are suggestive of viral URI  vs. Early sinusitis (minimal sinus pressure though).  We discussed that a serious infection or illness is unlikely. We also discussed other potential etiologies none suggestive of bacterial infection at this time. We discussed treatment side effects, likely course, antibiotic misuse, transmission, and signs of developing a serious illness.  Finally, we reviewed reasons to return to care including if symptoms worsen or persist or new concerns arise. Also see AVS.

## 2015-03-31 NOTE — Patient Instructions (Addendum)
Upper respiratory infection vs. Early viral sinusitis  Can use plain mucinex. Do not take dextromethorphan or sudafed products.   Follow up with [place] if you have worsening or new symptoms- specifically shortness of breath or prolonged fever, symptoms that persist beyond 10 days.   Continue your scheduled tylenol for diffuse aches   See me if you continue to have fevers through Thursday

## 2015-04-13 ENCOUNTER — Telehealth: Payer: Self-pay | Admitting: Family Medicine

## 2015-04-13 ENCOUNTER — Encounter: Payer: Self-pay | Admitting: Family Medicine

## 2015-04-13 MED ORDER — AMOXICILLIN-POT CLAVULANATE 875-125 MG PO TABS: 1.0000 | ORAL_TABLET | Freq: Two times a day (BID) | ORAL | Status: DC

## 2015-04-13 NOTE — Telephone Encounter (Signed)
Pt notified Rx has been sent to local pharmacy.

## 2015-04-13 NOTE — Telephone Encounter (Signed)
Pt wife called to say that her husband was here a few weeks ago. She said he was getting better but over the weekend he now has congestion and is spitting up a lot of phelm. She would like a call back.

## 2015-04-13 NOTE — Telephone Encounter (Signed)
You said this in pt AVS Follow up with [place] if you have worsening or new symptoms- specifically shortness of breath or prolonged fever, symptoms that persist beyond 10 days.  But pt denies SOB and fever. Should he still come in to be seen?

## 2015-04-13 NOTE — Telephone Encounter (Signed)
Pt wife states her husband had been doing fine and during this weekend he got worse with yellow and green phlem with excessive cough. He has been taking Mucinex over the weekend which is not helping. Please advise.

## 2015-04-13 NOTE — Telephone Encounter (Signed)
I was concerned about an early sinusitis. Given persistence of symptoms beyond 10 days as well as double sickening- will call in augmentin x 7 days for bacterial sinusitis

## 2015-04-16 ENCOUNTER — Encounter: Payer: Self-pay | Admitting: Neurology

## 2015-04-16 ENCOUNTER — Ambulatory Visit (INDEPENDENT_AMBULATORY_CARE_PROVIDER_SITE_OTHER): Payer: Medicare Other | Admitting: Neurology

## 2015-04-16 VITALS — BP 142/62 | HR 80 | Ht 69.0 in | Wt 239.0 lb

## 2015-04-16 DIAGNOSIS — E538 Deficiency of other specified B group vitamins: Secondary | ICD-10-CM | POA: Diagnosis not present

## 2015-04-16 DIAGNOSIS — H35033 Hypertensive retinopathy, bilateral: Secondary | ICD-10-CM | POA: Diagnosis not present

## 2015-04-16 DIAGNOSIS — G20 Parkinson's disease: Secondary | ICD-10-CM

## 2015-04-16 DIAGNOSIS — G4733 Obstructive sleep apnea (adult) (pediatric): Secondary | ICD-10-CM

## 2015-04-16 DIAGNOSIS — H5203 Hypermetropia, bilateral: Secondary | ICD-10-CM | POA: Diagnosis not present

## 2015-04-16 MED ORDER — CYANOCOBALAMIN 1000 MCG/ML IJ SOLN
1000.0000 ug | Freq: Once | INTRAMUSCULAR | Status: AC
  Administered 2015-04-16: 1000 ug via INTRAMUSCULAR
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
## 2015-04-16 NOTE — Progress Notes (Signed)
Thanks!  Who knew that people actually read my notes!  I will addend.  Appreciate it

## 2015-04-30 ENCOUNTER — Other Ambulatory Visit: Payer: Self-pay | Admitting: Family Medicine

## 2015-05-04 ENCOUNTER — Encounter: Payer: Self-pay | Admitting: Family Medicine

## 2015-05-04 ENCOUNTER — Ambulatory Visit (INDEPENDENT_AMBULATORY_CARE_PROVIDER_SITE_OTHER): Payer: Medicare Other | Admitting: Family Medicine

## 2015-05-04 VITALS — BP 150/70 | HR 80 | Temp 98.1°F | Wt 244.0 lb

## 2015-05-04 DIAGNOSIS — R5383 Other fatigue: Secondary | ICD-10-CM | POA: Diagnosis not present

## 2015-05-04 DIAGNOSIS — I5032 Chronic diastolic (congestive) heart failure: Secondary | ICD-10-CM | POA: Diagnosis not present

## 2015-05-04 DIAGNOSIS — G4733 Obstructive sleep apnea (adult) (pediatric): Secondary | ICD-10-CM

## 2015-05-04 DIAGNOSIS — E65 Localized adiposity: Secondary | ICD-10-CM

## 2015-05-04 DIAGNOSIS — R6889 Other general symptoms and signs: Secondary | ICD-10-CM | POA: Diagnosis not present

## 2015-05-04 DIAGNOSIS — Z9989 Dependence on other enabling machines and devices: Secondary | ICD-10-CM

## 2015-05-04 LAB — COMPREHENSIVE METABOLIC PANEL
ALK PHOS: 94 U/L (ref 39–117)
ALT: 6 U/L (ref 0–53)
AST: 15 U/L (ref 0–37)
Albumin: 3.9 g/dL (ref 3.5–5.2)
BILIRUBIN TOTAL: 0.5 mg/dL (ref 0.2–1.2)
BUN: 27 mg/dL — ABNORMAL HIGH (ref 6–23)
CO2: 32 mEq/L (ref 19–32)
Calcium: 9.4 mg/dL (ref 8.4–10.5)
Chloride: 100 mEq/L (ref 96–112)
Creatinine, Ser: 0.87 mg/dL (ref 0.40–1.50)
GFR: 89.89 mL/min (ref >60.00)
Glucose, Bld: 78 mg/dL (ref 70–99)
Potassium: 3.9 mEq/L (ref 3.5–5.1)
Sodium: 138 mEq/L (ref 135–145)
TOTAL PROTEIN: 7.2 g/dL (ref 6.0–8.3)

## 2015-05-04 LAB — CBC
HEMATOCRIT: 39.1 % (ref 39.0–52.0)
HEMOGLOBIN: 12.8 g/dL — AB (ref 13.0–17.0)
MCHC: 32.7 g/dL (ref 30.0–36.0)
MCV: 81 fl (ref 78.0–100.0)
PLATELETS: 206 10*3/uL (ref 150.0–400.0)
RBC: 4.82 Mil/uL (ref 4.22–5.81)
RDW: 15.7 % — ABNORMAL HIGH (ref 11.5–15.5)
WBC: 5.2 10*3/uL (ref 4.0–10.5)

## 2015-05-04 LAB — TSH: TSH: 0.82 u[IU]/mL (ref 0.35–4.50)

## 2015-05-04 NOTE — Progress Notes (Signed)
Walter Reddish, MD  Subjective:  Walter Gill is a 81 y.o. year old very pleasant male patient who presents with:  Cold intolerance/fatigue-newly reported Diastolic CHF- stable to worsening? OSA- controlled on cpap -Cold most of the time even if 79 degrees in the house-wearing a sweatshirt. Seems to be more tired than normal. Slight weight gain. Arms seem to be thinner and abdomen seems bigger though muscle wasting. About 6 months of symptoms, not progressive. Sleeping on 4 pillows for a while with no recent increase. Compliant with lasix 40mg  BID. Eating a lot of sweets admittedly. CPAP- wearing CPAP   Lab Results  Component Value Date   TSH 0.82 05/04/2015   ROS- no unintentional weight loss, no alcohol consumption, no abdominal pain  Past Medical History- Chronic pain from Parkinson's posturing, chronic diastolic CHF, OSA< HLD, gout, history PE and DVT, urinary incontinence  Medications- reviewed and updated Current Outpatient Prescriptions  Medication Sig Dispense Refill  . allopurinol (ZYLOPRIM) 100 MG tablet Take 1 tablet (100 mg total) by mouth daily. 30 tablet 5  . AMBULATORY NON FORMULARY MEDICATION 1 Units by Does not apply route daily. Set CPAP to 12 1 Units 0  . aspirin 81 MG chewable tablet Chew 81 mg by mouth at bedtime.     . bisacodyl (DULCOLAX) 5 MG EC tablet Take 2 tablets (10 mg total) by mouth every morning. 30 tablet 0  . carbidopa-levodopa (SINEMET CR) 50-200 MG per tablet Take 1 tablet by mouth 5 (five) times daily. 450 tablet 3  . CRESTOR 10 MG tablet TAKE 1 TABLET DAILY 90 tablet 1  . Elastic Bandages & Supports (WRIST SPLINT/COCK-UP/RIGHT L) MISC 1 Device by Does not apply route daily. 1 each 0  . furosemide (LASIX) 40 MG tablet Take 1 tablet (40 mg total) by mouth 2 (two) times daily. 30 tablet 5  . Magnesium Oxide 500 MG TABS Take 500 mg by mouth 2 (two) times daily.    . Mirabegron ER (MYRBETRIQ) 25 MG TB24 Take 25 mg by mouth at bedtime.     .  nitrofurantoin, macrocrystal-monohydrate, (MACROBID) 100 MG capsule Take 100 mg by mouth at bedtime.     . polyethylene glycol (MIRALAX / GLYCOLAX) packet Take 17 g by mouth daily. 14 each 0  . potassium chloride SA (K-DUR,KLOR-CON) 20 MEQ tablet Take 20 mEq by mouth 2 (two) times daily.    . Probiotic Product (PROBIOTIC DAILY PO) Take 2 capsules by mouth daily. Pt uses Risa-Bid (Bacid caplet)    . selegiline (ELDEPRYL) 5 MG capsule Take 5 mg by mouth 2 (two) times daily.    . tamsulosin (FLOMAX) 0.4 MG CAPS capsule TAKE 1 CAPSULE DAILY 90 capsule 3  . acetaminophen (TYLENOL) 650 MG CR tablet Take 1,300 mg by mouth every 8 (eight) hours.     No current facility-administered medications for this visit.    Objective: BP 150/70 mmHg  Pulse 80  Temp(Src) 98.1 F (36.7 C)  Wt 244 lb (110.678 kg) Gen: NAD, resting comfortably Neck: no JVD, patient does get very winded when initially laid him down, but improves at angle for JVD testing CV: RRR no murmurs rubs or gallops Lungs: CTAB no crackles, wheeze, rhonchi Abdomen: soft/nontender/nondistended/normal bowel sounds. No rebound or guarding.  Ext: 2+ pitting edema unchanged Skin: warm, dry, venous stasis changes   Assessment/Plan:  Chronic diastolic heart failure 2+ edema largely unchanged. Last echo 2007-reasonable to update with reported fatigue. Continue current lasix dose.    OSA on CPAP  Compliant with CPAP- doubt cause of fatigue.    Cold intolerance/fatigue/central obesity Check labs for anemia and thyroid abnormalities though I doubt. Update echo. Could be related to parkinson's which leads to inactivity. b12 already repleted. Think testosterone testing low yield. Central obesity could be due to fluid build up from CHF or liver so check LFTs-low suspicion. Also inactivity could also be contributing to muscle wasting while still gaining adiposity in abdomen.   Labs came back largely normal. Only very mild anemia t 12.8 but near  baseline. TSH normal. Liver normal.   Follow up by phone after echo Orders Placed This Encounter  Procedures  . Echocardiogram    Standing Status: Future     Number of Occurrences:      Standing Expiration Date: 08/03/2016    Order Specific Question:  Where should this test be performed    Answer:  [facility])    Order Specific Question:  Complete or Limited study?    Answer:  Complete    Order Specific Question:  With Image Enhancing Agent or without Image Enhancing Agent?    Answer:  With Image Enhancing Agent    Order Specific Question:  Reason for exam-Echo    Answer:  Congestive Heart Failure  428.0 / I50.9   Results for orders placed or performed in visit on 05/04/15 (from the past 24 hour(s))  CBC     Status: Abnormal   Collection Time: 05/04/15  1:59 PM  Result Value Ref Range   WBC 5.2 4.0 - 10.5 K/uL   RBC 4.82 4.22 - 5.81 Mil/uL   Platelets 206.0 150.0 - 400.0 K/uL   Hemoglobin 12.8 (L) 13.0 - 17.0 g/dL   HCT 39.1 39.0 - 52.0 %   MCV 81.0 78.0 - 100.0 fl   MCHC 32.7 30.0 - 36.0 g/dL   RDW 15.7 (H) 11.5 - 15.5 %  Comprehensive metabolic panel     Status: Abnormal   Collection Time: 05/04/15  1:59 PM  Result Value Ref Range   Sodium 138 135 - 145 mEq/L   Potassium 3.9 3.5 - 5.1 mEq/L   Chloride 100 96 - 112 mEq/L   CO2 32 19 - 32 mEq/L   Glucose, Bld 78 70 - 99 mg/dL   BUN 27 (H) 6 - 23 mg/dL   Creatinine, Ser 0.87 0.40 - 1.50 mg/dL   Total Bilirubin 0.5 0.2 - 1.2 mg/dL   Alkaline Phosphatase 94 39 - 117 U/L   AST 15 0 - 37 U/L   ALT 6 0 - 53 U/L   Total Protein 7.2 6.0 - 8.3 g/dL   Albumin 3.9 3.5 - 5.2 g/dL   Calcium 9.4 8.4 - 10.5 mg/dL   GFR 89.89 >60.00 mL/min  TSH     Status: None   Collection Time: 05/04/15  1:59 PM  Result Value Ref Range   TSH 0.82 0.35 - 4.50 uIU/mL

## 2015-05-04 NOTE — Assessment & Plan Note (Addendum)
2+ edema largely unchanged. Last echo 2007-reasonable to update with reported fatigue. Continue current lasix dose.

## 2015-05-04 NOTE — Patient Instructions (Signed)
Fatigue, heart failure, central obesity, cold intolerance  -check echocardiogram  -update labs  Follow up after echocardiogram

## 2015-05-04 NOTE — Assessment & Plan Note (Signed)
Compliant with CPAP- doubt cause of fatigue.

## 2015-05-08 ENCOUNTER — Ambulatory Visit (HOSPITAL_COMMUNITY): Payer: Medicare Other | Attending: Family Medicine

## 2015-05-08 ENCOUNTER — Other Ambulatory Visit: Payer: Self-pay

## 2015-05-08 DIAGNOSIS — I509 Heart failure, unspecified: Secondary | ICD-10-CM | POA: Diagnosis not present

## 2015-05-08 DIAGNOSIS — I5032 Chronic diastolic (congestive) heart failure: Secondary | ICD-10-CM | POA: Diagnosis not present

## 2015-05-13 ENCOUNTER — Ambulatory Visit (INDEPENDENT_AMBULATORY_CARE_PROVIDER_SITE_OTHER): Payer: Medicare Other | Admitting: Neurology

## 2015-05-13 DIAGNOSIS — E538 Deficiency of other specified B group vitamins: Secondary | ICD-10-CM | POA: Diagnosis not present

## 2015-05-13 MED ORDER — CYANOCOBALAMIN 1000 MCG/ML IJ SOLN
1000.0000 ug | Freq: Once | INTRAMUSCULAR | Status: AC
  Administered 2015-05-13: 1000 ug via INTRAMUSCULAR

## 2015-05-13 NOTE — Progress Notes (Signed)
B12 injection to left deltoid with no apparent complications.   

## 2015-05-19 ENCOUNTER — Other Ambulatory Visit: Payer: Self-pay | Admitting: Internal Medicine

## 2015-06-08 ENCOUNTER — Other Ambulatory Visit: Payer: Self-pay | Admitting: Family Medicine

## 2015-06-09 ENCOUNTER — Telehealth: Payer: Self-pay | Admitting: Neurology

## 2015-06-09 NOTE — Telephone Encounter (Signed)
Pt's wife Velta Addison called and wanted to know if Dr Tat could prescribe a pain med for him,  She thinks it is Tramadol/Dawn CB# [phone]

## 2015-06-09 NOTE — Telephone Encounter (Signed)
Patient advised to contact PCP re: pain management.

## 2015-06-12 ENCOUNTER — Ambulatory Visit (INDEPENDENT_AMBULATORY_CARE_PROVIDER_SITE_OTHER): Payer: Medicare Other | Admitting: Neurology

## 2015-06-12 DIAGNOSIS — E538 Deficiency of other specified B group vitamins: Secondary | ICD-10-CM | POA: Diagnosis not present

## 2015-06-12 MED ORDER — CYANOCOBALAMIN 1000 MCG/ML IJ SOLN
1000.0000 ug | Freq: Once | INTRAMUSCULAR | Status: AC
  Administered 2015-06-12: 1000 ug via INTRAMUSCULAR

## 2015-06-12 NOTE — Progress Notes (Signed)
B12 injection to left deltoid with no apparent complications.   

## 2015-07-01 ENCOUNTER — Other Ambulatory Visit: Payer: Self-pay | Admitting: Neurology

## 2015-07-01 ENCOUNTER — Other Ambulatory Visit: Payer: Self-pay | Admitting: Family Medicine

## 2015-07-01 NOTE — Telephone Encounter (Signed)
Carbidopa Levodopa 50/200 refill requested. Per last office note- patient to remain on medication. Refill approved and sent to patient's pharmacy.

## 2015-07-10 ENCOUNTER — Ambulatory Visit: Payer: Medicare Other

## 2015-07-11 ENCOUNTER — Other Ambulatory Visit: Payer: Self-pay | Admitting: Family Medicine

## 2015-07-14 ENCOUNTER — Ambulatory Visit (INDEPENDENT_AMBULATORY_CARE_PROVIDER_SITE_OTHER): Payer: Medicare Other | Admitting: Neurology

## 2015-07-14 DIAGNOSIS — E538 Deficiency of other specified B group vitamins: Secondary | ICD-10-CM

## 2015-07-14 MED ORDER — CYANOCOBALAMIN 1000 MCG/ML IJ SOLN
1000.0000 ug | Freq: Once | INTRAMUSCULAR | Status: AC
  Administered 2015-07-14: 1000 ug via INTRAMUSCULAR

## 2015-07-14 NOTE — Progress Notes (Signed)
B12 injection to right deltoid with no apparent complications.   

## 2015-07-19 ENCOUNTER — Inpatient Hospital Stay (HOSPITAL_COMMUNITY)
Admission: EM | Admit: 2015-07-19 | Discharge: 2015-07-22 | DRG: 552 | Disposition: A | Discharge status: Home / Self Care | Payer: Medicare Other | Attending: Internal Medicine | Admitting: Internal Medicine

## 2015-07-19 DIAGNOSIS — Z7982 Long term (current) use of aspirin: Secondary | ICD-10-CM

## 2015-07-19 DIAGNOSIS — M109 Gout, unspecified: Secondary | ICD-10-CM | POA: Diagnosis present

## 2015-07-19 DIAGNOSIS — R159 Full incontinence of feces: Secondary | ICD-10-CM | POA: Diagnosis present

## 2015-07-19 DIAGNOSIS — Z96641 Presence of right artificial hip joint: Secondary | ICD-10-CM | POA: Diagnosis present

## 2015-07-19 DIAGNOSIS — N400 Enlarged prostate without lower urinary tract symptoms: Secondary | ICD-10-CM | POA: Diagnosis present

## 2015-07-19 DIAGNOSIS — R609 Edema, unspecified: Secondary | ICD-10-CM | POA: Diagnosis not present

## 2015-07-19 DIAGNOSIS — E785 Hyperlipidemia, unspecified: Secondary | ICD-10-CM | POA: Diagnosis present

## 2015-07-19 DIAGNOSIS — I5032 Chronic diastolic (congestive) heart failure: Secondary | ICD-10-CM | POA: Diagnosis present

## 2015-07-19 DIAGNOSIS — I4510 Unspecified right bundle-branch block: Secondary | ICD-10-CM | POA: Diagnosis present

## 2015-07-19 DIAGNOSIS — N401 Enlarged prostate with lower urinary tract symptoms: Secondary | ICD-10-CM | POA: Diagnosis present

## 2015-07-19 DIAGNOSIS — M5432 Sciatica, left side: Secondary | ICD-10-CM | POA: Diagnosis not present

## 2015-07-19 DIAGNOSIS — M5442 Lumbago with sciatica, left side: Secondary | ICD-10-CM | POA: Diagnosis present

## 2015-07-19 DIAGNOSIS — Z79899 Other long term (current) drug therapy: Secondary | ICD-10-CM

## 2015-07-19 DIAGNOSIS — Z86711 Personal history of pulmonary embolism: Secondary | ICD-10-CM

## 2015-07-19 DIAGNOSIS — G20A1 Parkinson's disease without dyskinesia, without mention of fluctuations: Secondary | ICD-10-CM

## 2015-07-19 DIAGNOSIS — I10 Essential (primary) hypertension: Secondary | ICD-10-CM | POA: Diagnosis present

## 2015-07-19 DIAGNOSIS — Z9989 Dependence on other enabling machines and devices: Secondary | ICD-10-CM

## 2015-07-19 DIAGNOSIS — M545 Low back pain: Secondary | ICD-10-CM | POA: Diagnosis not present

## 2015-07-19 DIAGNOSIS — Z888 Allergy status to other drugs, medicaments and biological substances status: Secondary | ICD-10-CM | POA: Diagnosis not present

## 2015-07-19 DIAGNOSIS — R262 Difficulty in walking, not elsewhere classified: Secondary | ICD-10-CM

## 2015-07-19 DIAGNOSIS — M5430 Sciatica, unspecified side: Secondary | ICD-10-CM | POA: Diagnosis present

## 2015-07-19 DIAGNOSIS — R32 Unspecified urinary incontinence: Secondary | ICD-10-CM | POA: Diagnosis present

## 2015-07-19 DIAGNOSIS — R2689 Other abnormalities of gait and mobility: Secondary | ICD-10-CM | POA: Diagnosis not present

## 2015-07-19 DIAGNOSIS — G4733 Obstructive sleep apnea (adult) (pediatric): Secondary | ICD-10-CM | POA: Diagnosis not present

## 2015-07-19 DIAGNOSIS — M4806 Spinal stenosis, lumbar region: Secondary | ICD-10-CM | POA: Diagnosis not present

## 2015-07-19 DIAGNOSIS — M1990 Unspecified osteoarthritis, unspecified site: Secondary | ICD-10-CM | POA: Diagnosis present

## 2015-07-19 DIAGNOSIS — Z86718 Personal history of other venous thrombosis and embolism: Secondary | ICD-10-CM

## 2015-07-19 DIAGNOSIS — Z9842 Cataract extraction status, left eye: Secondary | ICD-10-CM | POA: Diagnosis not present

## 2015-07-19 DIAGNOSIS — Z9841 Cataract extraction status, right eye: Secondary | ICD-10-CM | POA: Diagnosis not present

## 2015-07-19 DIAGNOSIS — M549 Dorsalgia, unspecified: Secondary | ICD-10-CM | POA: Diagnosis not present

## 2015-07-19 DIAGNOSIS — G20 Parkinson's disease: Secondary | ICD-10-CM

## 2015-07-19 LAB — CBC WITH DIFFERENTIAL/PLATELET
BASOS ABS: 0 10*3/uL (ref 0.0–0.1)
Basophils Relative: 1 % (ref 0–1)
EOS PCT: 3 % (ref 0–5)
Eosinophils Absolute: 0.1 10*3/uL (ref 0.0–0.7)
HCT: 42 % (ref 39.0–52.0)
HEMOGLOBIN: 13.4 g/dL (ref 13.0–17.0)
LYMPHS ABS: 1.3 10*3/uL (ref 0.7–4.0)
LYMPHS PCT: 29 % (ref 12–46)
MCH: 27.2 pg (ref 26.0–34.0)
MCHC: 31.9 g/dL (ref 30.0–36.0)
MCV: 85.2 fL (ref 78.0–100.0)
MONOS PCT: 11 % (ref 3–12)
Monocytes Absolute: 0.5 10*3/uL (ref 0.1–1.0)
NEUTROS ABS: 2.5 10*3/uL (ref 1.7–7.7)
Neutrophils Relative %: 56 % (ref 43–77)
Platelets: 158 10*3/uL (ref 150–400)
RBC: 4.93 MIL/uL (ref 4.22–5.81)
RDW: 15.1 % (ref 11.5–15.5)
WBC: 4.3 10*3/uL (ref 4.0–10.5)

## 2015-07-19 LAB — BASIC METABOLIC PANEL
Anion gap: 10 (ref 5–15)
BUN: 27 mg/dL — ABNORMAL HIGH (ref 6–20)
CO2: 28 mmol/L (ref 22–32)
Calcium: 9.2 mg/dL (ref 8.9–10.3)
Chloride: 103 mmol/L (ref 101–111)
Creatinine, Ser: 0.94 mg/dL (ref 0.61–1.24)
Glucose, Bld: 126 mg/dL — ABNORMAL HIGH (ref 65–99)
Potassium: 4.1 mmol/L (ref 3.5–5.1)
SODIUM: 141 mmol/L (ref 135–145)

## 2015-07-19 MED ORDER — ONDANSETRON HCL 4 MG/2ML IJ SOLN
4.0000 mg | Freq: Once | INTRAMUSCULAR | Status: AC
  Administered 2015-07-19: 4 mg via INTRAVENOUS
  Filled 2015-07-19: qty 2

## 2015-07-19 MED ORDER — MORPHINE SULFATE 4 MG/ML IJ SOLN
4.0000 mg | INTRAMUSCULAR | Status: DC | PRN
  Administered 2015-07-19: 4 mg via INTRAVENOUS
  Filled 2015-07-19: qty 1

## 2015-07-19 NOTE — ED Provider Notes (Signed)
Pt seen and examined.  PT unable to ambulate.  H/O Parkinsons and RLE myoclonus.  Pt c/o LLE pain and exam consistent with sciatica.  No history of Disc disease or malignancy.    Pt with history of severe constipation with narcotics , thus hesitant to accept any.  Given IV MS.  Pt needs MRI, and PT/OT.    D/W Hospitalist. Appreciate consult and assistance with admission for this patient.  Tanna Furry, MD 07/19/15 2350

## 2015-07-19 NOTE — ED Notes (Signed)
Pt c/o back pain and left side pain that radiates for his left buttocks down his left leg  X 1 week. Pt alert and oriented x4 and in no acute distress

## 2015-07-19 NOTE — ED Provider Notes (Signed)
CSN: [id number]     Arrival date & time 07/19/15  2105 History   First MD Initiated Contact with Patient 07/19/15 2110     Chief Complaint  Patient presents with  . Back Pain  . Leg Pain    Left leg     HPI  Mr. Mcburney is a 81 year old male with PMHx of Parkinson's presenting with a one week history of back pain. No trauma to the area. The pain starts in the lumbar back/left buttock and radiates down the back of the thigh to the knee. The pain is severe and sharp. Pain is present at rest and is aggravated by movement. Mr. Barnier has Parkinson's with dystonia of the right leg and is now unable to walk due to severe pain in the left leg. He is urine and fecal incontinent at baseline. No recent fevers, viral illnesses or malignancy. Mr. Steinert and his wife are concerned about pain control because he does not take narcotics due to a hospitalization for impaction from chronic narcotics use last year requiring rehab. Denies IVDU. Denies headaches, chest pain, shortness of breath, abdominal pain, nausea, weakness, numbness of groin or legs and rash.   Past Medical History  Diagnosis Date  . Sleep apnea   . RBBB (right bundle branch block)   . Parkinson disease   . Gout   . Hyperlipidemia   . Hypertension   . OA (osteoarthritis)   . PE (pulmonary embolism) september 2008  . DVT (deep venous thrombosis)   . Left ventricular dysfunction     impaired LV relaxation  . HYPERTENSION 07/02/2007    Qualifier: Diagnosis of  By: Leanne Chang MD, Bruce     Past Surgical History  Procedure Laterality Date  . Appendectomy    . Cholecystectomy    . Dupruytens contracture      left  . Total hip arthroplasty  9/8/8    right  . Pilonidal cyst removal    . Eye surgery      ? for lid lag vs apraxia of eye opening  . Cataract extraction      bilateral   Family History  Problem Relation Age of Onset  . Heart disease Mother     had murmur, never went to doctor  . Cancer Father     possible gi   History   Substance Use Topics  . Smoking status: Never Smoker   . Smokeless tobacco: Never Used  . Alcohol Use: No    Review of Systems  Constitutional: Positive for activity change. Negative for fever.  Respiratory: Negative for shortness of breath.   Cardiovascular: Negative for chest pain.  Gastrointestinal: Negative for nausea, vomiting, abdominal pain, diarrhea and constipation.  Musculoskeletal: Positive for back pain and gait problem. Negative for joint swelling.  Skin: Negative for rash.  Neurological: Negative for light-headedness and headaches.      Allergies  Metoclopramide  Home Medications   Prior to Admission medications   Medication Sig Start Date End Date Taking? Authorizing Provider  acetaminophen (TYLENOL) 650 MG CR tablet Take 1,300 mg by mouth every 8 (eight) hours.   Yes Historical Provider, MD  allopurinol (ZYLOPRIM) 100 MG tablet TAKE 1 TABLET BY MOUTH EVERY DAY 07/13/15  Yes Marin Olp, MD  aspirin 81 MG chewable tablet Chew 81 mg by mouth at bedtime.    Yes Historical Provider, MD  bisacodyl (DULCOLAX) 5 MG EC tablet Take 2 tablets (10 mg total) by mouth every morning. 10/29/14  Yes  Annita Brod, MD  carbidopa-levodopa (SINEMET CR) 50-200 MG per tablet TAKE 1 TABLET 5 TIMES DAILY 07/01/15  Yes Rebecca S Tat, DO  CRESTOR 10 MG tablet TAKE 1 TABLET DAILY Patient taking differently: TAKE 1 TABLET BY MOUTH DAILY 04/30/15  Yes Marin Olp, MD  furosemide (LASIX) 40 MG tablet Take 1 tablet (40 mg total) by mouth 2 (two) times daily. 03/16/15  Yes Marin Olp, MD  KLOR-CON M20 20 MEQ tablet TAKE 1 TABLET TWICE A DAY Patient taking differently: TAKE 1 TABLET BY MOUTH TWICE A DAY 06/08/15  Yes Marin Olp, MD  Magnesium Oxide 500 MG TABS Take 500 mg by mouth 2 (two) times daily.   Yes Historical Provider, MD  Mirabegron ER (MYRBETRIQ) 25 MG TB24 Take 25 mg by mouth at bedtime.    Yes Historical Provider, MD  nitrofurantoin, macrocrystal-monohydrate,  (MACROBID) 100 MG capsule Take 100 mg by mouth at bedtime.  04/11/11  Yes Historical Provider, MD  polyethylene glycol (MIRALAX / GLYCOLAX) packet Take 17 g by mouth daily. 10/29/14  Yes Annita Brod, MD  Probiotic Product (PROBIOTIC DAILY PO) Take 2 capsules by mouth daily. Pt uses Risa-Bid (Bacid caplet)   Yes Historical Provider, MD  selegiline (ELDEPRYL) 5 MG capsule Take 5 mg by mouth 2 (two) times daily.   Yes Historical Provider, MD  tamsulosin (FLOMAX) 0.4 MG CAPS capsule TAKE 1 CAPSULE DAILY 04/30/15  Yes Marin Olp, MD  allopurinol (ZYLOPRIM) 100 MG tablet Take 1 tablet (100 mg total) by mouth daily. Patient not taking: Reported on 07/19/2015 03/16/15   Marin Olp, MD  AMBULATORY NON FORMULARY MEDICATION 1 Units by Does not apply route daily. Set CPAP to 12 Patient not taking: Reported on 07/19/2015 07/18/14   Eustace Quail Tat, DO  CRESTOR 10 MG tablet TAKE 1 TABLET DAILY Patient not taking: Reported on 07/19/2015 07/01/15   Marin Olp, MD  Elastic Bandages & Supports (WRIST SPLINT/COCK-UP/RIGHT L) MISC 1 Device by Does not apply route daily. 12/15/14   Eustace Quail Tat, DO  furosemide (LASIX) 40 MG tablet TAKE 1 TABLET BY MOUTH TWICE A DAY Patient not taking: Reported on 07/19/2015 05/19/15   Marin Olp, MD  tamsulosin [facility]) 0.4 MG CAPS capsule TAKE 1 CAPSULE DAILY Patient not taking: Reported on 07/19/2015 07/01/15   Marin Olp, MD   BP 167/77 mmHg  Pulse 74  Temp(Src) 97.6 F (36.4 C) (Oral)  Resp 20  SpO2 99% Physical Exam  Constitutional: He is oriented to person, place, and time. He appears well-developed and well-nourished. No distress.  HENT:  Head: Normocephalic and atraumatic.  Cardiovascular: Normal rate and regular rhythm.   Pulmonary/Chest: Effort normal and breath sounds normal.  Abdominal: Soft. He exhibits distension. There is no tenderness.  Musculoskeletal:  Full ROM intact of left hip and knee accompanied by severe pain. Pitting edema of b/l  ankles and feet. Superficial skin shearing wound of right lower leg. Patient not able to ambulate due to pain. Pedal pulses palpable b/l  Neurological: He is alert and oriented to person, place, and time.  4/5 strength of left hip and knee, mostly impeded by pain. 5/5 strength of right hip and knee. Sensation fully intact over lower extremities.  Skin: Skin is warm and dry. No rash noted.  Psychiatric: He has a normal mood and affect. His behavior is normal.    ED Course  Procedures (including critical care time) Labs Review Labs Reviewed  BASIC METABOLIC  PANEL - Abnormal; Notable for the following:    Glucose, Bld 126 (*)    BUN 27 (*)    All other components within normal limits  CBC WITH DIFFERENTIAL/PLATELET    Imaging Review No results found.   EKG Interpretation None      MDM   Final diagnoses:  Sciatica, left  Inability to ambulate due to hip  Parkinson's disease    1. Sciatica, left - Inability to ambulate 2/2 to pain most likely requiring PT/OT rehab - Discussed pain control and Mr. Pekala agreed to 4 mg morphine with significant pain improvement - Dr. Tanna Furry consulted hospitalist and put in admission orders - Admission to med/surg for MRI and OT/PT consult in the AM    [facility] Vennela Jutte, PA-C 07/20/15 0043  Tanna Furry, MD 07/27/15 0045

## 2015-07-19 NOTE — ED Notes (Signed)
Bed: WA02 Expected date:  Expected time:  Means of arrival:  Comments: EMS 

## 2015-07-20 ENCOUNTER — Observation Stay (HOSPITAL_COMMUNITY): Payer: Medicare Other

## 2015-07-20 ENCOUNTER — Encounter (HOSPITAL_COMMUNITY): Payer: Self-pay | Admitting: Internal Medicine

## 2015-07-20 DIAGNOSIS — M5442 Lumbago with sciatica, left side: Secondary | ICD-10-CM | POA: Diagnosis not present

## 2015-07-20 DIAGNOSIS — G20 Parkinson's disease: Secondary | ICD-10-CM

## 2015-07-20 DIAGNOSIS — G4733 Obstructive sleep apnea (adult) (pediatric): Secondary | ICD-10-CM | POA: Diagnosis not present

## 2015-07-20 DIAGNOSIS — M545 Low back pain: Secondary | ICD-10-CM | POA: Diagnosis not present

## 2015-07-20 DIAGNOSIS — M4806 Spinal stenosis, lumbar region: Secondary | ICD-10-CM | POA: Diagnosis not present

## 2015-07-20 DIAGNOSIS — M5432 Sciatica, left side: Secondary | ICD-10-CM | POA: Diagnosis not present

## 2015-07-20 DIAGNOSIS — E785 Hyperlipidemia, unspecified: Secondary | ICD-10-CM | POA: Diagnosis not present

## 2015-07-20 DIAGNOSIS — R609 Edema, unspecified: Secondary | ICD-10-CM

## 2015-07-20 DIAGNOSIS — I4510 Unspecified right bundle-branch block: Secondary | ICD-10-CM | POA: Diagnosis not present

## 2015-07-20 DIAGNOSIS — I5032 Chronic diastolic (congestive) heart failure: Secondary | ICD-10-CM | POA: Diagnosis not present

## 2015-07-20 DIAGNOSIS — G20A1 Parkinson's disease without dyskinesia, without mention of fluctuations: Secondary | ICD-10-CM

## 2015-07-20 LAB — BASIC METABOLIC PANEL
ANION GAP: 6 (ref 5–15)
BUN: 27 mg/dL — ABNORMAL HIGH (ref 6–20)
CO2: 30 mmol/L (ref 22–32)
Calcium: 9.3 mg/dL (ref 8.9–10.3)
Chloride: 104 mmol/L (ref 101–111)
Creatinine, Ser: 0.9 mg/dL (ref 0.61–1.24)
GFR calc Af Amer: >60 mL/min (ref >60)
Glucose, Bld: 110 mg/dL — ABNORMAL HIGH (ref 65–99)
Potassium: 3.7 mmol/L (ref 3.5–5.1)
SODIUM: 140 mmol/L (ref 135–145)

## 2015-07-20 LAB — CBC
HCT: 41 % (ref 39.0–52.0)
HEMOGLOBIN: 13.2 g/dL (ref 13.0–17.0)
MCH: 27.7 pg (ref 26.0–34.0)
MCHC: 32.2 g/dL (ref 30.0–36.0)
MCV: 86 fL (ref 78.0–100.0)
Platelets: 142 10*3/uL — ABNORMAL LOW (ref 150–400)
RBC: 4.77 MIL/uL (ref 4.22–5.81)
RDW: 15.2 % (ref 11.5–15.5)
WBC: 5 10*3/uL (ref 4.0–10.5)

## 2015-07-20 MED ORDER — MAGNESIUM OXIDE 400 (241.3 MG) MG PO TABS
400.0000 mg | ORAL_TABLET | Freq: Two times a day (BID) | ORAL | Status: DC
  Administered 2015-07-20 – 2015-07-22 (×5): 400 mg via ORAL
  Filled 2015-07-20 (×6): qty 1

## 2015-07-20 MED ORDER — ASPIRIN 81 MG PO CHEW
81.0000 mg | CHEWABLE_TABLET | Freq: Every day | ORAL | Status: DC
  Administered 2015-07-20 – 2015-07-21 (×2): 81 mg via ORAL
  Filled 2015-07-20 (×3): qty 1

## 2015-07-20 MED ORDER — ENOXAPARIN SODIUM 40 MG/0.4ML ~~LOC~~ SOLN
40.0000 mg | SUBCUTANEOUS | Status: DC
  Administered 2015-07-20 – 2015-07-22 (×3): 40 mg via SUBCUTANEOUS
  Filled 2015-07-20 (×3): qty 0.4

## 2015-07-20 MED ORDER — ONDANSETRON HCL 4 MG/2ML IJ SOLN: 4.0000 mg | Freq: Four times a day (QID) | INTRAMUSCULAR | Status: DC | PRN

## 2015-07-20 MED ORDER — ALLOPURINOL 100 MG PO TABS
100.0000 mg | ORAL_TABLET | Freq: Every day | ORAL | Status: DC
  Administered 2015-07-20 – 2015-07-22 (×3): 100 mg via ORAL
  Filled 2015-07-20 (×3): qty 1

## 2015-07-20 MED ORDER — ONDANSETRON HCL 4 MG PO TABS: 4.0000 mg | ORAL_TABLET | Freq: Four times a day (QID) | ORAL | Status: DC | PRN

## 2015-07-20 MED ORDER — ACETAMINOPHEN 650 MG RE SUPP: 650.0000 mg | Freq: Four times a day (QID) | RECTAL | Status: DC | PRN

## 2015-07-20 MED ORDER — DOCUSATE SODIUM 100 MG PO CAPS
100.0000 mg | ORAL_CAPSULE | Freq: Two times a day (BID) | ORAL | Status: DC
  Administered 2015-07-20 – 2015-07-22 (×5): 100 mg via ORAL
  Filled 2015-07-20 (×6): qty 1

## 2015-07-20 MED ORDER — FUROSEMIDE 40 MG PO TABS
40.0000 mg | ORAL_TABLET | Freq: Two times a day (BID) | ORAL | Status: DC
  Administered 2015-07-20 – 2015-07-22 (×5): 40 mg via ORAL
  Filled 2015-07-20 (×8): qty 1

## 2015-07-20 MED ORDER — BISACODYL 5 MG PO TBEC
10.0000 mg | DELAYED_RELEASE_TABLET | Freq: Every morning | ORAL | Status: DC
  Administered 2015-07-20 – 2015-07-22 (×3): 10 mg via ORAL
  Filled 2015-07-20 (×3): qty 2

## 2015-07-20 MED ORDER — NITROFURANTOIN MONOHYD MACRO 100 MG PO CAPS
100.0000 mg | ORAL_CAPSULE | Freq: Every day | ORAL | Status: DC
  Administered 2015-07-20 – 2015-07-21 (×2): 100 mg via ORAL
  Filled 2015-07-20 (×3): qty 1

## 2015-07-20 MED ORDER — TAMSULOSIN HCL 0.4 MG PO CAPS
0.4000 mg | ORAL_CAPSULE | Freq: Every day | ORAL | Status: DC
  Administered 2015-07-20 – 2015-07-22 (×3): 0.4 mg via ORAL
  Filled 2015-07-20 (×3): qty 1

## 2015-07-20 MED ORDER — MORPHINE SULFATE 2 MG/ML IJ SOLN
2.0000 mg | INTRAMUSCULAR | Status: DC | PRN
  Administered 2015-07-20: 2 mg via INTRAVENOUS
  Filled 2015-07-20: qty 1

## 2015-07-20 MED ORDER — POLYETHYLENE GLYCOL 3350 17 G PO PACK
17.0000 g | PACK | Freq: Every day | ORAL | Status: DC
Start: 2015-07-20 — End: 2015-07-22
  Administered 2015-07-20 – 2015-07-22 (×3): 17 g via ORAL
  Filled 2015-07-20 (×3): qty 1

## 2015-07-20 MED ORDER — POTASSIUM CHLORIDE CRYS ER 20 MEQ PO TBCR
20.0000 meq | EXTENDED_RELEASE_TABLET | Freq: Two times a day (BID) | ORAL | Status: DC
  Administered 2015-07-20 – 2015-07-22 (×5): 20 meq via ORAL
  Filled 2015-07-20 (×7): qty 1

## 2015-07-20 MED ORDER — ACETAMINOPHEN 325 MG PO TABS: 650.0000 mg | ORAL_TABLET | Freq: Four times a day (QID) | ORAL | Status: DC | PRN

## 2015-07-20 MED ORDER — SODIUM CHLORIDE 0.9 % IJ SOLN
3.0000 mL | Freq: Two times a day (BID) | INTRAMUSCULAR | Status: DC
  Administered 2015-07-20 – 2015-07-22 (×5): 3 mL via INTRAVENOUS

## 2015-07-20 MED ORDER — SELEGILINE HCL 5 MG PO CAPS
5.0000 mg | ORAL_CAPSULE | Freq: Two times a day (BID) | ORAL | Status: DC
  Administered 2015-07-20 – 2015-07-22 (×6): 5 mg via ORAL
  Filled 2015-07-20 (×7): qty 1

## 2015-07-20 MED ORDER — CARBIDOPA-LEVODOPA ER 50-200 MG PO TBCR
1.0000 | EXTENDED_RELEASE_TABLET | Freq: Every day | ORAL | Status: DC
  Administered 2015-07-20 – 2015-07-22 (×12): 1 via ORAL
  Filled 2015-07-20 (×16): qty 1

## 2015-07-20 MED ORDER — MIRABEGRON ER 25 MG PO TB24
25.0000 mg | ORAL_TABLET | Freq: Every day | ORAL | Status: DC
  Administered 2015-07-20 – 2015-07-21 (×2): 25 mg via ORAL
  Filled 2015-07-20 (×3): qty 1

## 2015-07-20 MED ORDER — ROSUVASTATIN CALCIUM 10 MG PO TABS
10.0000 mg | ORAL_TABLET | Freq: Every day | ORAL | Status: DC
  Administered 2015-07-20 – 2015-07-21 (×2): 10 mg via ORAL
  Filled 2015-07-20 (×3): qty 1

## 2015-07-20 MED ORDER — OXYCODONE HCL 5 MG PO TABS
5.0000 mg | ORAL_TABLET | Freq: Four times a day (QID) | ORAL | Status: DC | PRN
  Administered 2015-07-21: 5 mg via ORAL
  Filled 2015-07-20: qty 1

## 2015-07-20 NOTE — Evaluation (Signed)
Physical Therapy Evaluation Patient Details Name: Walter Gill MRN: [id number] DOB: 1934-12-04 Today's Date: 07/20/2015   History of Present Illness  81 y.o. male with history of Parkinson's disease, OSA, hyperlipidemia, chronic diastolic CHF presents to the ER because of worsening pain in low back radiating to his left lower extremity. Pt was unable to bear weight to walk due to pain.  Clinical Impression  Pt admitted with above diagnosis. Pt currently with functional limitations due to the deficits listed below (see PT Problem List). Mod assist for supine to sit, min/guard for sit to stand. Gait deferred due to urinary incontinence upon standing, will assess later today.  Pt will benefit from skilled PT to increase their independence and safety with mobility to allow discharge to the venue listed below.       Follow Up Recommendations Home health PT    Equipment Recommendations  None recommended by PT    Recommendations for Other Services       Precautions / Restrictions Precautions Precautions: Fall Precaution Comments: one fall in past year per pt/wife Restrictions Weight Bearing Restrictions: No      Mobility  Bed Mobility Overal bed mobility: Needs Assistance Bed Mobility: Rolling;Sidelying to Sit Rolling: Min assist Sidelying to sit: Mod assist       General bed mobility comments: verbal cues for technique, mod A to raise trunk  Transfers Overall transfer level: Needs assistance Equipment used: Rolling walker (2 wheeled) Transfers: Sit to/from Stand Sit to Stand: Min guard;From elevated surface         General transfer comment: verbal cues for hand placmeent; pt stood with RW for 90 seconds, was able to march in place without LOB  Ambulation/Gait             General Gait Details: deferred due to incontinence in standing  Stairs            Wheelchair Mobility    Modified Rankin (Stroke Patients Only)       Balance Overall balance  assessment: Needs assistance   Sitting balance-Leahy Scale: Good     Standing balance support: Bilateral upper extremity supported Standing balance-Leahy Scale: Poor Standing balance comment: relies on BUE support                             Pertinent Vitals/Pain Pain Assessment: 0-10 Pain Score: 6  Pain Location: low back and LLE Pain Descriptors / Indicators: Tightness Pain Intervention(s): Limited activity within patient's tolerance;Monitored during session (pt is avoiding narcotics)    Home Living Family/patient expects to be discharged to:: Private residence Living Arrangements: Spouse/significant other Available Help at Discharge: Family;Available 24 hours/day Type of Home: House Home Access: Stairs to enter Entrance Stairs-Rails: None Entrance Stairs-Number of Steps: 2 -1 platform, then 1 step Home Layout: One level Home Equipment: Walker - 2 wheels;Walker - 4 wheels;Shower seat;Cane - single point;Bedside commode      Prior Function Level of Independence: Independent with assistive device(s)         Comments: I with ADLs, uses RW, plays drums in 50s-60s rock band     Hand Dominance        Extremity/Trunk Assessment   Upper Extremity Assessment: Overall WFL for tasks assessed           Lower Extremity Assessment: Overall WFL for tasks assessed      Cervical / Trunk Assessment: Normal  Communication   Communication: No difficulties  Cognition Arousal/Alertness: Awake/alert  Behavior During Therapy: WFL for tasks assessed/performed Overall Cognitive Status: Within Functional Limits for tasks assessed                      General Comments      Exercises        Assessment/Plan    PT Assessment Patient needs continued PT services  PT Diagnosis Acute pain;Difficulty walking   PT Problem List Decreased activity tolerance;Pain;Decreased balance;Decreased mobility  PT Treatment Interventions Gait training;Stair  training;Functional mobility training;Therapeutic activities;Patient/family education;Therapeutic exercise   PT Goals (Current goals can be found in the Care Plan section) Acute Rehab PT Goals 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 PT Goal Formulation: With patient/family Time For Goal Achievement: 08/03/15 Potential to Achieve Goals: Good    Frequency Min 3X/week   Barriers to discharge        Co-evaluation               End of Session Equipment Utilized During Treatment: Gait belt Activity Tolerance: Patient tolerated treatment well Patient left: in bed (returned to bed for ultrasound procedure) Nurse Communication: Mobility status    Functional Assessment Tool Used: clinical judgement Functional Limitation: Mobility: Walking and moving around Mobility: Walking and Moving Around Current Status (P3790): At least 40 percent but less than 60 percent impaired, limited or restricted Mobility: Walking and Moving Around Goal Status [phone]): At least 1 percent but less than 20 percent impaired, limited or restricted    Time: 0923-0950 PT Time Calculation (min) (ACUTE ONLY): 27 min   Charges:   PT Evaluation $Initial PT Evaluation Tier I: 1 Procedure PT Treatments $Therapeutic Activity: 8-22 mins   PT G Codes:   PT G-Codes **NOT FOR INPATIENT CLASS** Functional Assessment Tool Used: clinical judgement Functional Limitation: Mobility: Walking and moving around Mobility: Walking and Moving Around Current Status (D5329): At least 40 percent but less than 60 percent impaired, limited or restricted Mobility: Walking and Moving Around Goal Status [phone]): At least 1 percent but less than 20 percent impaired, limited or restricted    Philomena Doheny 07/20/2015, 10:00 AM [phone]

## 2015-07-20 NOTE — H&P (Signed)
[facility] History and Physical  Walter Gill KGM:[id number] DOB: 17-Jun-1934 DOA: 07/19/2015  Referring physician: Dr.James. PCP: [name], MD  Specialists: Dr.Tat.  Chief Complaint: low back pain.  HPI: Walter Gill is a 81 y.o. male with history of Parkinson's disease, OSA, hyperlipidemia, chronic diastolic CHF presents to the ER because of worsening pain in low back radiating to his left lower extremity. Patient started experiencing worsening pain since 2 days ago which has acutely worsened yesterday. Patient is unable to place any pressure on the left leg. Pain starts in the left buttock radiates to the left knee. Denies any trauma or fall. Patient was given pain medication and since patient is unable to put any pressure or walk due to the pain patient has been admitted for further management. Patient has chronic incontinence of urine. After admission patient's pain has improved from pain relief medications.   Review of Systems: As presented in the history of presenting illness, rest negative.  Past Medical History  Diagnosis Date  . Sleep apnea   . RBBB (right bundle branch block)   . Parkinson disease   . Gout   . Hyperlipidemia   . Hypertension   . OA (osteoarthritis)   . PE (pulmonary embolism) september 2008  . DVT (deep venous thrombosis)   . Left ventricular dysfunction     impaired LV relaxation  . HYPERTENSION 07/02/2007    Qualifier: Diagnosis of  By: Leanne Chang MD, Bruce     Past Surgical History  Procedure Laterality Date  . Appendectomy    . Cholecystectomy    . Dupruytens contracture      left  . Total hip arthroplasty  9/8/8    right  . Pilonidal cyst removal    . Eye surgery      ? for lid lag vs apraxia of eye opening  . Cataract extraction      bilateral   Social History:  reports that he has never smoked. He has never used smokeless tobacco. He reports that he does not drink alcohol or use illicit drugs. Where does patient live  home. Can patient participate in ADLs? Yes.  Allergies  Allergen Reactions  . Metoclopramide Other (See Comments)    Interference with Sinemet, blocks dopamine leading to sedation    Family History:  Family History  Problem Relation Age of Onset  . Heart disease Mother     had murmur, never went to doctor  . Cancer Father     possible gi      Prior to Admission medications   Medication Sig Start Date End Date Taking? Authorizing Provider  acetaminophen (TYLENOL) 650 MG CR tablet Take 1,300 mg by mouth every 8 (eight) hours.   Yes Historical Provider, MD  allopurinol (ZYLOPRIM) 100 MG tablet TAKE 1 TABLET BY MOUTH EVERY DAY 07/13/15  Yes Marin Olp, MD  aspirin 81 MG chewable tablet Chew 81 mg by mouth at bedtime.    Yes Historical Provider, MD  bisacodyl (DULCOLAX) 5 MG EC tablet Take 2 tablets (10 mg total) by mouth every morning. 10/29/14  Yes Annita Brod, MD  carbidopa-levodopa (SINEMET CR) 50-200 MG per tablet TAKE 1 TABLET 5 TIMES DAILY 07/01/15  Yes Rebecca S Tat, DO  CRESTOR 10 MG tablet TAKE 1 TABLET DAILY Patient taking differently: TAKE 1 TABLET BY MOUTH DAILY 04/30/15  Yes Marin Olp, MD  furosemide (LASIX) 40 MG tablet Take 1 tablet (40 mg total) by mouth 2 (two) times daily. 03/16/15  Yes Marin Olp, MD  KLOR-CON M20 20 MEQ tablet TAKE 1 TABLET TWICE A DAY Patient taking differently: TAKE 1 TABLET BY MOUTH TWICE A DAY 06/08/15  Yes Marin Olp, MD  Magnesium Oxide 500 MG TABS Take 500 mg by mouth 2 (two) times daily.   Yes Historical Provider, MD  Mirabegron ER (MYRBETRIQ) 25 MG TB24 Take 25 mg by mouth at bedtime.    Yes Historical Provider, MD  nitrofurantoin, macrocrystal-monohydrate, (MACROBID) 100 MG capsule Take 100 mg by mouth at bedtime.  04/11/11  Yes Historical Provider, MD  polyethylene glycol (MIRALAX / GLYCOLAX) packet Take 17 g by mouth daily. 10/29/14  Yes Annita Brod, MD  Probiotic Product (PROBIOTIC DAILY PO) Take 2 capsules  by mouth daily. Pt uses Risa-Bid (Bacid caplet)   Yes Historical Provider, MD  selegiline (ELDEPRYL) 5 MG capsule Take 5 mg by mouth 2 (two) times daily.   Yes Historical Provider, MD  tamsulosin (FLOMAX) 0.4 MG CAPS capsule TAKE 1 CAPSULE DAILY 04/30/15  Yes Marin Olp, MD  allopurinol (ZYLOPRIM) 100 MG tablet Take 1 tablet (100 mg total) by mouth daily. Patient not taking: Reported on 07/19/2015 03/16/15   Marin Olp, MD  AMBULATORY NON FORMULARY MEDICATION 1 Units by Does not apply route daily. Set CPAP to 12 Patient not taking: Reported on 07/19/2015 07/18/14   Eustace Quail Tat, DO  CRESTOR 10 MG tablet TAKE 1 TABLET DAILY Patient not taking: Reported on 07/19/2015 07/01/15   Marin Olp, MD  Elastic Bandages & Supports (WRIST SPLINT/COCK-UP/RIGHT L) MISC 1 Device by Does not apply route daily. 12/15/14   Eustace Quail Tat, DO  furosemide (LASIX) 40 MG tablet TAKE 1 TABLET BY MOUTH TWICE A DAY Patient not taking: Reported on 07/19/2015 05/19/15   Marin Olp, MD  tamsulosin [facility]) 0.4 MG CAPS capsule TAKE 1 CAPSULE DAILY Patient not taking: Reported on 07/19/2015 07/01/15   Marin Olp, MD    Physical Exam: Filed Vitals:   07/19/15 2107  BP: 167/77  Pulse: 74  Temp: 97.6 F (36.4 C)  TempSrc: Oral  Resp: 20  SpO2: 99%     General:  Obese not in distress.  Eyes: anicteric no pallor.  ENT: no discharge from the ears eyes nose and mouth.  Neck: no mass felt.  Cardiovascular: S1-S2 heard.  Respiratory: no rhonchi or crepitations.  Abdomen: soft nontender bowel sounds present.  Skin: chronic skin changes in the lower extremity.  Musculoskeletal: chronic lower extremity edema.  Psychiatric: appears normal.  Neurologic: alert awake oriented to time place and person. Moves all extremities.  Labs on Admission:  Basic Metabolic Panel:  Recent Labs Lab 07/19/15 2236  NA 141  K 4.1  CL 103  CO2 28  GLUCOSE 126*  BUN 27*  CREATININE 0.94  CALCIUM 9.2   Liver  Function Tests: No results for input(s): AST, ALT, ALKPHOS, BILITOT, PROT, ALBUMIN in the last 168 hours. No results for input(s): LIPASE, AMYLASE in the last 168 hours. No results for input(s): AMMONIA in the last 168 hours. CBC:  Recent Labs Lab 07/19/15 2236  WBC 4.3  NEUTROABS 2.5  HGB 13.4  HCT 42.0  MCV 85.2  PLT 158   Cardiac Enzymes: No results for input(s): CKTOTAL, CKMB, CKMBINDEX, TROPONINI in the last 168 hours.  BNP (last 3 results) No results for input(s): BNP in the last 8760 hours.  ProBNP (last 3 results)  Recent Labs  10/29/14 1108  PROBNP 809.0*  CBG: No results for input(s): GLUCAP in the last 168 hours.  Radiological Exams on Admission: No results found.    Assessment/Plan Principal Problem:   Sciatica Active Problems:   PARKINSON'S DISEASE   OSA on CPAP   Chronic diastolic heart failure   Parkinson's disease   1. Sciatica - patient's pain is compatible with left-sided sciatica. Patient has significant pain due to which patient is unable to walk. I have ordered MRI lumbar spine. Physical therapy consult and pain relief medication. 2. Parkinson's disease - continue home medications. 3. Chronic diastolic CHF - appears compensated and Lasix. 4. OSA on CPAP. 5. History of gout. 6. History of BPH.  I have reviewed patient's old charts and labs.   DVT ProphylaxisLovenox.  Code Status: full code.  Family Communication: discussed with patient's wife.  Disposition Plan: admit for observation.    KAKRAKANDY,ARSHAD N. [facility] Pager [phone].  If 7PM-7AM, please contact night-coverage [email or web address] Password [facility] 07/20/2015, 12:44 AM

## 2015-07-20 NOTE — Progress Notes (Signed)
Bilateral lower extremity venous duplex completed:  No obvious evidence of DVT, superficial thrombosis, or Baker's cyst.  Technically difficult study due to the patient's body habitus.   

## 2015-07-20 NOTE — Progress Notes (Signed)
Pt arrived to unit room 1506 via stretcher. VS taken, pt oriented to room and callbell with no complaints. Pt guide at bedside. Wife at bedside. General weakness, left leg pain generated with activity. Initial assessment completed. Will continue to monitor throughout shift.

## 2015-07-20 NOTE — Progress Notes (Signed)
Physical Therapy Treatment Patient Details Name: Walter Gill MRN: [id number] DOB: Apr 03, 1934 Today's Date: 07/20/2015    History of Present Illness 81 y.o. male with history of Parkinson's disease, OSA, hyperlipidemia, chronic diastolic CHF presents to the ER because of worsening pain in low back radiating to his left lower extremity. Pt was unable to bear weight to walk due to pain.    PT Comments    Attempted ambulation this afternoon, pt walked 15' with RW, distance limited by 8/10 LBP. He reports he was able to walk farther earlier this morning with Dr. Coralyn Pear, but the pain is worse now.   Follow Up Recommendations  Home health PT     Equipment Recommendations  None recommended by PT    Recommendations for Other Services       Precautions / Restrictions Precautions Precautions: Fall Precaution Comments: one fall in past year per pt/wife Restrictions Weight Bearing Restrictions: No    Mobility  Bed Mobility Overal bed mobility: Needs Assistance Bed Mobility: Rolling;Sidelying to Sit Rolling: Min assist Sidelying to sit: Min assist;HOB elevated       General bed mobility comments: verbal cues for technique, min A to raise trunk from [place] at 30* (pt couldn't tolerate bed flat)  Transfers Overall transfer level: Needs assistance Equipment used: Rolling walker (2 wheeled) Transfers: Sit to/from Stand Sit to Stand: Min guard;From elevated surface         General transfer comment: min/guard for safety, VCs for hand placement  Ambulation/Gait Ambulation/Gait assistance: Min guard Ambulation Distance (Feet): 15 Feet Assistive device: Rolling walker (2 wheeled) Gait Pattern/deviations: Step-to pattern   Gait velocity interpretation: Below normal speed for age/gender General Gait Details: distance limited by increased LBP to 8/10, pt reported he'd been able to walk to nurses station with Dr Coralyn Pear earlier this morning, however pain is increased now   Stairs             Wheelchair Mobility    Modified Rankin (Stroke Patients Only)       Balance Overall balance assessment: Needs assistance   Sitting balance-Leahy Scale: Good     Standing balance support: Bilateral upper extremity supported Standing balance-Leahy Scale: Poor Standing balance comment: relies on BUE support                    Cognition Arousal/Alertness: Awake/alert Behavior During Therapy: WFL for tasks assessed/performed Overall Cognitive Status: Within Functional Limits for tasks assessed                      Exercises      General Comments        Pertinent Vitals/Pain Pain Score: 8  Pain Location: low back Pain Descriptors / Indicators: Tightness Pain Intervention(s): Limited activity within patient's tolerance;Monitored during session (pt avoiding narcotics )    Home Living Family/patient expects to be discharged to:: Private residence Living Arrangements: Spouse/significant other                  Prior Function            PT Goals (current goals can now be found in the care plan section) Acute Rehab PT Goals 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 PT Goal Formulation: With patient/family Time For Goal Achievement: 08/03/15 Potential to Achieve Goals: Good Progress towards PT goals: Progressing toward goals    Frequency  Min 3X/week    PT Plan Current plan remains appropriate    Co-evaluation  End of Session Equipment Utilized During Treatment: Gait belt Activity Tolerance: Patient limited by pain Patient left: in bed (returned to bed for ultrasound procedure)     Time: 7948-0165 PT Time Calculation (min) (ACUTE ONLY): 24 min  Charges:  $Gait Training: 8-22 mins $Therapeutic Activity: 8-22 mins                    G Codes:  Functional Assessment Tool Used: clinical judgement Functional Limitation: Mobility: Walking and moving around Mobility: Walking and Moving Around Current Status  ([id number]): At least 40 percent but less than 60 percent impaired, limited or restricted Mobility: Walking and Moving Around Goal Status [phone]): At least 1 percent but less than 20 percent impaired, limited or restricted   Philomena Doheny 07/20/2015, 1:55 PM [phone]

## 2015-07-20 NOTE — Progress Notes (Signed)
Pt states that he does not wish to wear his nocturnal CPAP at this time, but he will have the RN contact RT should he change his mind and desire it at anytime.  RT will continue to monitor as needed.

## 2015-07-20 NOTE — Progress Notes (Signed)
PROGRESS NOTE  Walter Gill PZW:[id number] DOB: [birth year] DOA: 07/19/2015 PCP: [name], MD   HPI: Walter Gill is a 81 y.o. male with Parkinson's disease, OSA, hyperlipidemia, and chronic diastolic CHF presenting to the ER because of worsening pain in low back region radiating to his left lower extremity. He started experiencing worsening pain x 2 days ago which has acutely worsened yesterday. He's unable to place any pressure on the left leg. Pain starts in the left buttocks and radiates to the left knee. Denies any trauma or fall. He was given pain medication and since he's unable to apply any pressure or walk due to the pain, he's been admitted for further management. Patient has chronic incontinence of urine. After admission patient's pain has improved from pain relief medications.   Subjective / 24 H Interval events Pain has lessoned, patient is able to ambulate with walker down the hall. Patient discusses going home where he lives in a townhouse with wife with no stairs.   Assessment/Plan: Principal Problem:   Sciatica Active Problems:   PARKINSON'S DISEASE   OSA on CPAP   Chronic diastolic heart failure   Urinary incontinence   Parkinson's disease  1. Sciatica - patient's pain is compatible with left-sided sciatica. On PE, patient had a positive straight leg test with left leg.  - PT met with patient this afternoon and walking distance was limited due to 8/10 LBP. - MRI lumbar spine ordered - continue pain meds and ambulation  2. Chronic incontinence - could be due to Parkinson's -stable  3. Parkinson's Disease - Patient on carbidopa/levodopa 20/200 one tablet 5 times daily -He did not have significant rigidity on physical exam  4. Chronic diastolic heart failure - 2+ lower extremity edema - Echo 05/08/15, moderate basal and mild concentric hypertrophy of LV. Systolic function normal. Estimated EF - 60-65%. Abnormal LV relaxation (grade 1 diastolic  dysfunction). Doppler parametersare consistent with mildly elevated ventricular filling pressure. - on lasix -Currently compensated  5. OSA on CPAP - patient did not want to wear CPAP last pm per nurse - continue to encourage use  Diet: Diet Heart Room service appropriate?: Yes; Fluid consistency:: Thin; Fluid restriction:: 1200 mL Fluid Fluids: none DVT Prophylaxis: lovenox  Code Status: Full Code Family Communication: discussed plan with patient and wife at bedside Disposition Plan: Probable discharge, pending MRI  Consultants:  none  Procedures:  none   Antibiotics  Anti-infectives    None     Objective  Filed Vitals:   07/19/15 2107 07/20/15 0053 07/20/15 1418  BP: 167/77 182/81 119/59  Pulse: 74 79 74  Temp: 97.6 F (36.4 C) 97.9 F (36.6 C) 97.8 F (36.6 C)  TempSrc: Oral Oral Oral  Resp: _0 Height:  _1  (1.778 m)   Weight:  110.269 kg (243 lb 1.6 oz)   SpO2: 99% 100% 95%    Intake/Output Summary (Last 24 hours) at 07/20/15 1531 Last data filed at 07/20/15 1342  Gross per 24 hour  Intake    400 ml  Output    450 ml  Net    -50 ml   Filed Weights   07/20/15 0053  Weight: 110.269 kg (243 lb 1.6 oz)    Exam:  GENERAL: Pleasant, obese male, in no acute distress.  HEENT: Extraocular muscles are intact. Mucous membranes are moist.   NECK: Supple. No carotid bruits.   LUNGS: Clear to auscultation. No wheezing or crackles  HEART: Regular rate and rhythm  without murmur. S1 and S2 heard. 2+ pulses, no JVD.  ABDOMEN: Firm, distended (patient endorses as baseline), non-tender. Positive bowel sounds.   EXTREMITIES: Lower extremity edema bilaterally. Without any cyanosis, clubbing, rash, or lesions.  NEUROLOGIC: He is alert and oriented x3. Cranial nerves II through XII are grossly intact. Strength 3/5 in left leg, 5/5 in other 3 extremities.  PSYCHIATRIC: Normal mood and affect  SKIN: No ulceration or induration present.  Data  Reviewed: Basic Metabolic Panel:  Recent Labs Lab 07/19/15 2236 07/20/15 0517  NA 141 140  K 4.1 3.7  CL 103 104  CO2 28 30  GLUCOSE 126* 110*  BUN 27* 27*  CREATININE 0.94 0.90  CALCIUM 9.2 9.3   CBC:  Recent Labs Lab 07/19/15 2236 07/20/15 0517  WBC 4.3 5.0  NEUTROABS 2.5  --   HGB 13.4 13.2  HCT 42.0 41.0  MCV 85.2 86.0  PLT 158 142*   ProBNP (last 3 results)  Recent Labs  10/29/14 1108  PROBNP 809.0*   Scheduled Meds: . allopurinol  100 mg Oral Daily  . aspirin  81 mg Oral QHS  . bisacodyl  10 mg Oral q morning - 10a  . carbidopa-levodopa  1 tablet Oral 5 X Daily  . docusate sodium  100 mg Oral BID  . enoxaparin (LOVENOX) injection  40 mg Subcutaneous Q24H  . furosemide  40 mg Oral BID  . magnesium oxide  400 mg Oral BID  . mirabegron ER  25 mg Oral QHS  . nitrofurantoin (macrocrystal-monohydrate)  100 mg Oral QHS  . polyethylene glycol  17 g Oral Daily  . potassium chloride SA  20 mEq Oral BID  . rosuvastatin  10 mg Oral q1800  . selegiline  5 mg Oral BID  . sodium chloride  3 mL Intravenous Q12H  . tamsulosin  0.4 mg Oral Daily   Time spent coordinating care: 35 minutes, greater than 50% of this time was spent in counseling, explanation of diagnosis, planning of further management, coordination of care. All images within past 24 hours personally reviewed.  Brandi L. Guerry Bruin, PA-S Kelvin Cellar, MD [facility] Pager. [phone]. If 7 PM - 7 AM, please contact night-coverage at [email or web address], password Walter Gill 07/20/2015, 3:31 PM  LOS: 1 day     Addendum  I personally evaluated patient on 07/20/2015 and agree with the above findings. Patient is a pleasant 81 year old gentleman with a past medical history Parkinson's disease, history of DJD of lumbar spine he was admitted to the medicine service on 07/20/2015. Patient reporting having a history of lower back pain which became acutely worse in the past 24 hours. At home he was unable to ambulate. He  described his pain as originating in the buttocks region and radiating down the posterior aspect of his left lower extremity to his knee. Symptoms felt to be secondary to sciatica. On my evaluation today he reported feeling better as he was able to get out of bed and ambulated down the hallway with his walker. Physical therapy consultation was pending. Further workup with MRI of lumbar spine is pending as well. Will continue conservative management. I think he would benefit from home health services and home PT.

## 2015-07-20 NOTE — Progress Notes (Signed)
Patient did not want to wear CPAP tonight per RN.

## 2015-07-21 DIAGNOSIS — E785 Hyperlipidemia, unspecified: Secondary | ICD-10-CM | POA: Diagnosis not present

## 2015-07-21 DIAGNOSIS — G4733 Obstructive sleep apnea (adult) (pediatric): Secondary | ICD-10-CM

## 2015-07-21 DIAGNOSIS — I4510 Unspecified right bundle-branch block: Secondary | ICD-10-CM | POA: Diagnosis not present

## 2015-07-21 DIAGNOSIS — I5032 Chronic diastolic (congestive) heart failure: Secondary | ICD-10-CM | POA: Diagnosis not present

## 2015-07-21 DIAGNOSIS — M5432 Sciatica, left side: Secondary | ICD-10-CM | POA: Diagnosis not present

## 2015-07-21 DIAGNOSIS — M5442 Lumbago with sciatica, left side: Secondary | ICD-10-CM | POA: Diagnosis not present

## 2015-07-21 DIAGNOSIS — G20 Parkinson's disease: Secondary | ICD-10-CM

## 2015-07-21 DIAGNOSIS — M545 Low back pain: Secondary | ICD-10-CM | POA: Diagnosis not present

## 2015-07-21 MED ORDER — OXYCODONE HCL 5 MG PO TABS
5.0000 mg | ORAL_TABLET | ORAL | Status: DC | PRN
  Administered 2015-07-21: 5 mg via ORAL
  Filled 2015-07-21: qty 1

## 2015-07-21 MED ORDER — LIDOCAINE 5 % EX PTCH
1.0000 | MEDICATED_PATCH | CUTANEOUS | Status: DC
  Administered 2015-07-21: 1 via TRANSDERMAL
  Filled 2015-07-21 (×2): qty 1

## 2015-07-21 MED ORDER — ACETAMINOPHEN 325 MG PO TABS
650.0000 mg | ORAL_TABLET | Freq: Two times a day (BID) | ORAL | Status: DC
  Administered 2015-07-21 – 2015-07-22 (×3): 650 mg via ORAL
  Filled 2015-07-21 (×4): qty 2

## 2015-07-21 NOTE — Progress Notes (Signed)
Physical Therapy Treatment Patient Details Name: Walter Gill MRN: [id number] DOB: Apr 29, 1934 Today's Date: 07/21/2015    History of Present Illness 81 y.o. male with history of Parkinson's disease, OSA, hyperlipidemia, chronic diastolic CHF presents to the ER because of worsening pain in low back radiating to his left lower extremity. Pt was unable to bear weight to walk due to pain.    PT Comments    Pt progressing, incr gait distance today since pain better controlled  Follow Up Recommendations  Home health PT     Equipment Recommendations  None recommended by PT    Recommendations for Other Services       Precautions / Restrictions Precautions Precautions: Fall Precaution Comments: one fall in past year per pt/wife Restrictions Weight Bearing Restrictions: No    Mobility  Bed Mobility Overal bed mobility: Needs Assistance Bed Mobility: Rolling;Sidelying to Sit Rolling: Min assist Sidelying to sit: Min assist;HOB elevated       General bed mobility comments: verbal cues for technique, min A to raise trunk from [place] at 30* (pt couldn't tolerate bed flat)  Transfers Overall transfer level: Needs assistance Equipment used: Rolling walker (2 wheeled) Transfers: Sit to/from Stand Sit to Stand: Min assist;Min guard         General transfer comment: min/guard for safety, VCs for hand placement  Ambulation/Gait Ambulation/Gait assistance: Min guard Ambulation Distance (Feet): 80 Feet Assistive device: Rolling walker (2 wheeled) Gait Pattern/deviations: Wide base of support;Step-through pattern;Decreased stride length     General Gait Details: decr hip and knee flexion, trunk shifted to right ( pt baseline), decr  trunk rotation; pt self cues for posture occasionally   Stairs            Wheelchair Mobility    Modified Rankin (Stroke Patients Only)       Balance             Standing balance-Leahy Scale: Poor                       Cognition Arousal/Alertness: Awake/alert Behavior During Therapy: WFL for tasks assessed/performed Overall Cognitive Status: Within Functional Limits for tasks assessed                      Exercises      General Comments        Pertinent Vitals/Pain Pain Assessment: 0-10 Pain Score: 1  Pain Location: low back  and LLE  Pain Descriptors / Indicators: Aching Pain Intervention(s): Limited activity within patient's tolerance;Monitored during session;Repositioned;Premedicated before session    Home Living                      Prior Function            PT Goals (current goals can now be found in the care plan section) Acute Rehab PT Goals 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 PT Goal Formulation: With patient/family Time For Goal Achievement: 08/03/15 Potential to Achieve Goals: Good Progress towards PT goals: Progressing toward goals    Frequency  Min 3X/week    PT Plan Current plan remains appropriate    Co-evaluation             End of Session Equipment Utilized During Treatment: Gait belt Activity Tolerance: Patient tolerated treatment well Patient left: in chair;with call bell/phone within reach     Time: 2505-3976 PT Time Calculation (min) (ACUTE ONLY): 16 min  Charges:  $Gait  Training: 8-22 mins                    G Codes:      Monique Hefty 08-13-2015, 4:55 PM

## 2015-07-21 NOTE — Progress Notes (Signed)
PROGRESS NOTE  [name]:[id number] DOB: 11/26/1934 DOA: 07/19/2015 PCP: [name], MD   HPI: Walter Gill is a 81 y.o. male with Parkinson's disease, OSA, hyperlipidemia, and chronic diastolic CHF presenting to the ER because of worsening pain in low back region radiating to his left lower extremity. He started experiencing worsening pain x 2 days ago which has acutely worsened yesterday. He's unable to place any pressure on the left leg. Pain starts in the left buttocks and radiates to the left knee. Denies any trauma or fall. He was given pain medication and since he's unable to apply any pressure or walk due to the pain, he's been admitted for further management. Patient has chronic incontinence of urine. After admission patient's pain has improved from pain relief medications.   Subjective / 24 H Interval events Pain has lessoned, patient is able to ambulate with walker down the hall. Patient discusses going home where he lives in a townhouse with wife with no stairs.   Assessment/Plan: Principal Problem:   Sciatica Active Problems:   OSA on CPAP   Chronic diastolic heart failure   Urinary incontinence   Parkinson's disease  1. Sciatica - patient's pain is compatible with left-sided sciatica. On PE, patient had a positive straight leg test with left leg.  - PT met with patient this afternoon and walking distance was limited due to 8/10 LBP. - MRI lumbar spine showing widespread progression of lumbar spine degeneration since previous study from 2009. -Patient having difficulties getting out of bed on his own due to pain. Will attempt to better control pain symptoms with scheduling Tylenol 650 mg 2 tablets twice a day and adding lidocaine patch. -Physical therapy working with patient, recommended home health PT.  2. Chronic incontinence -stable  3. Parkinson's Disease - Patient on carbidopa/levodopa 20/200 one tablet 5 times daily -He did not have significant  rigidity on physical exam  4. Chronic diastolic heart failure - 2+ lower extremity edema - Echo 05/08/15, moderate basal and mild concentric hypertrophy of LV. Systolic function normal. Estimated EF - 60-65%. Abnormal LV relaxation (grade 1 diastolic dysfunction). Doppler parametersare consistent with mildly elevated ventricular filling pressure. - on lasix -Currently compensated  5. OSA on CPAP - patient did not want to wear CPAP last pm per nurse - continue to encourage use  Diet: Diet Heart Room service appropriate?: Yes; Fluid consistency:: Thin; Fluid restriction:: 1200 mL Fluid Fluids: none DVT Prophylaxis: lovenox  Code Status: Full Code Family Communication: discussed plan with patient and wife at bedside Disposition Plan: Anticipate discharge in the next 24 hours to his home with home health services  Consultants:  none  Procedures:  none   Antibiotics  Anti-infectives    None     Objective  Filed Vitals:   07/20/15 2133 07/21/15 0500 07/21/15 0554 07/21/15 1437  BP: 150/77  154/67 136/59  Pulse: 77  71 66  Temp: 98.6 F (37 C)  97.6 F (36.4 C) 97.7 F (36.5 C)  TempSrc: Oral  Oral Oral  Resp: _0 Height:      Weight:  106.777 kg (235 lb 6.4 oz)    SpO2: 98%  96% 97%    Intake/Output Summary (Last 24 hours) at 07/21/15 1837 Last data filed at 07/21/15 1823  Gross per 24 hour  Intake    690 ml  Output      0 ml  Net    690 ml   [employer]  07/20/15 0053 07/21/15 0500  Weight: 110.269 kg (243 lb 1.6 oz) 106.777 kg (235 lb 6.4 oz)    Exam:  GENERAL: Pleasant, obese male, in no acute distress.  HEENT: Extraocular muscles are intact. Mucous membranes are moist.   NECK: Supple. No carotid bruits.   LUNGS: Clear to auscultation. No wheezing or crackles  HEART: Regular rate and rhythm without murmur. S1 and S2 heard. 2+ pulses, no JVD.  ABDOMEN: Firm, distended (patient endorses as baseline), non-tender. Positive bowel sounds.     EXTREMITIES: Lower extremity edema bilaterally. Without any cyanosis, clubbing, rash, or lesions.  NEUROLOGIC: He is alert and oriented x3. Cranial nerves II through XII are grossly intact. Strength 3/5 in left leg, 5/5 in other 3 extremities.  PSYCHIATRIC: Normal mood and affect  SKIN: No ulceration or induration present.  Data Reviewed: Basic Metabolic Panel:  Recent Labs Lab 07/19/15 2236 07/20/15 0517  NA 141 140  K 4.1 3.7  CL 103 104  CO2 28 30  GLUCOSE 126* 110*  BUN 27* 27*  CREATININE 0.94 0.90  CALCIUM 9.2 9.3   CBC:  Recent Labs Lab 07/19/15 2236 07/20/15 0517  WBC 4.3 5.0  NEUTROABS 2.5  --   HGB 13.4 13.2  HCT 42.0 41.0  MCV 85.2 86.0  PLT 158 142*   ProBNP (last 3 results)  Recent Labs  10/29/14 1108  PROBNP 809.0*   Scheduled Meds: . acetaminophen  650 mg Oral BID  . allopurinol  100 mg Oral Daily  . aspirin  81 mg Oral QHS  . bisacodyl  10 mg Oral q morning - 10a  . carbidopa-levodopa  1 tablet Oral 5 X Daily  . docusate sodium  100 mg Oral BID  . enoxaparin (LOVENOX) injection  40 mg Subcutaneous Q24H  . furosemide  40 mg Oral BID  . lidocaine  1 patch Transdermal Q24H  . magnesium oxide  400 mg Oral BID  . mirabegron ER  25 mg Oral QHS  . nitrofurantoin (macrocrystal-monohydrate)  100 mg Oral QHS  . polyethylene glycol  17 g Oral Daily  . potassium chloride SA  20 mEq Oral BID  . rosuvastatin  10 mg Oral q1800  . selegiline  5 mg Oral BID  . sodium chloride  3 mL Intravenous Q12H  . tamsulosin  0.4 mg Oral Daily   Time spent coordinating care: 25 minutes, greater than 50% of this time was spent in counseling, explanation of diagnosis, planning of further management, coordination of care. All images within past 24 hours personally reviewed.  Kelvin Cellar, MD [facility] Pager. [phone]. If 7 PM - 7 AM, please contact night-coverage at [email or web address], password [facility] 07/21/2015, 6:37 PM  LOS: 2 days

## 2015-07-22 ENCOUNTER — Telehealth: Payer: Self-pay | Admitting: Family Medicine

## 2015-07-22 DIAGNOSIS — I5032 Chronic diastolic (congestive) heart failure: Secondary | ICD-10-CM | POA: Diagnosis not present

## 2015-07-22 DIAGNOSIS — M5432 Sciatica, left side: Secondary | ICD-10-CM | POA: Diagnosis not present

## 2015-07-22 DIAGNOSIS — G20 Parkinson's disease: Secondary | ICD-10-CM | POA: Diagnosis not present

## 2015-07-22 MED ORDER — LIDOCAINE 5 % EX PTCH: MEDICATED_PATCH | CUTANEOUS | Status: DC

## 2015-07-22 MED ORDER — ACETAMINOPHEN ER 650 MG PO TBCR: EXTENDED_RELEASE_TABLET | ORAL | Status: AC

## 2015-07-22 MED ORDER — OXYCODONE HCL 5 MG PO TABS: 5.0000 mg | ORAL_TABLET | ORAL | Status: DC | PRN

## 2015-07-22 NOTE — Progress Notes (Signed)
Pt again states that if he decides to wear CPAP while sleeping then he will call and request it.  RT will continue to monitor as needed.

## 2015-07-22 NOTE — Telephone Encounter (Signed)
Pt needed  a hosp fup and I used a phy slot on 08/10/15 that was all that was all that was available.

## 2015-07-22 NOTE — Telephone Encounter (Signed)
ok 

## 2015-07-22 NOTE — Discharge Summary (Signed)
Physician Discharge Summary  Walter Gill DXI:[id number] DOB: [birth year] DOA: 07/19/2015  PCP: [name], MD  Admit date: 07/19/2015 Discharge date: 07/22/2015  Time spent: 35 minutes  Recommendations for Outpatient Follow-up:  1. Please follow up patient's sciatic, he was discharged on scheduled tylenol, lidocaine patch and as needed oxycodone 2. Prior to discharge he was set up [facility] for Home PT, OT, RN and AIDE.   Discharge Diagnoses:  Principal Problem:   Sciatica Active Problems:   OSA on CPAP   Chronic diastolic heart failure   Urinary incontinence   Parkinson's disease   Discharge Condition: Stable  Diet recommendation: Heart Healthy  Filed Weights   07/20/15 0053 07/21/15 0500 07/22/15 0528  Weight: 110.269 kg (243 lb 1.6 oz) 106.777 kg (235 lb 6.4 oz) 109.408 kg (241 lb 3.2 oz)    History of present illness:  Walter Gill is a 81 y.o. male with history of Parkinson's disease, OSA, hyperlipidemia, chronic diastolic CHF presents to the ER because of worsening pain in low back radiating to his left lower extremity. Patient started experiencing worsening pain since 2 days ago which has acutely worsened yesterday. Patient is unable to place any pressure on the left leg. Pain starts in the left buttock radiates to the left knee. Denies any trauma or fall. Patient was given pain medication and since patient is unable to put any pressure or walk due to the pain patient has been admitted for further management. Patient has chronic incontinence of urine. After admission patient's pain has improved from pain relief medications.   Hospital Course:  Walter Gill is a 81 y.o. male with Parkinson's disease, OSA, hyperlipidemia, and chronic diastolic CHF presenting to the ER because of worsening pain in low back region radiating to his left lower extremity. He started experiencing worsening pain x 2 days ago which has acutely worsened yesterday. He's unable to  place any pressure on the left leg. Pain starts in the left buttocks and radiates to the left knee. Denies any trauma or fall. He was given pain medication and since he's unable to apply any pressure or walk due to the pain, he's been admitted for further management. Patient has chronic incontinence of urine. After admission patient's pain has improved from pain relief medications.   1. Sciatica - Patient's pain is compatible with left-sided sciatica. On PE, patient had a positive straight leg test with left leg.  - On day of discharge he was able to ambulate down the hallway - MRI lumbar spine showing widespread progression of lumbar spine degeneration since previous study from 2009. -Will discharge onTylenol 650 mg 2 tablets twice a day and Lidocaine patch 12 hours on and 12 hours off.Oxycodone for severe breakthrough pain -He was set up with [place] prior to discharge  2. Parkinson's Disease - Patient on carbidopa/levodopa 20/200 one tablet 5 times daily -He did not have significant rigidity on physical exam  3. Chronic diastolic heart failure - 2+ lower extremity edema - Echo 05/08/15, moderate basal and mild concentric hypertrophy of LV. Systolic function normal. Estimated EF - 60-65%. Abnormal LV relaxation (grade 1 diastolic dysfunction). Doppler parametersare consistent with mildly elevated ventricular filling pressure. - on lasix -Currently compensated  4. OSA on CPAP - patient did not want to wear CPAP last pm per nurse - continue to encourage use   Consultations:  Physical Therapy  Discharge Exam: Filed Vitals:   07/22/15 0528  BP: 158/68  Pulse: 63  Temp:  97.4 F (36.3 C)  Resp: 20    GENERAL: Pleasant, obese male, in no acute distress.  HEENT: Extraocular muscles are intact. Mucous membranes are moist.   NECK: Supple. No carotid bruits.   LUNGS: Clear to auscultation. No wheezing or crackles  HEART: Regular rate and rhythm without murmur. S1  and S2 heard. 2+ pulses, no JVD.  ABDOMEN: Firm, distended (patient endorses as baseline), non-tender. Positive bowel sounds.   EXTREMITIES: Lower extremity edema bilaterally. Without any cyanosis, clubbing, rash, or lesions.  NEUROLOGIC: He is alert and oriented x3. Cranial nerves II through XII are grossly intact. Strength 3/5 in left leg, 5/5 in other 3 extremities.  PSYCHIATRIC: Normal mood and affect  SKIN: No ulceration or induration present.   Discharge Instructions   Discharge Instructions    Call MD for:  difficulty breathing, headache or visual disturbances    Complete by:  As directed      Call MD for:  extreme fatigue    Complete by:  As directed      Call MD for:  hives    Complete by:  As directed      Call MD for:  persistant dizziness or light-headedness    Complete by:  As directed      Call MD for:  persistant nausea and vomiting    Complete by:  As directed      Call MD for:  redness, tenderness, or signs of infection (pain, swelling, redness, odor or green/yellow discharge around incision site)    Complete by:  As directed      Call MD for:  severe uncontrolled pain    Complete by:  As directed      Call MD for:  temperature >100.4    Complete by:  As directed      Call MD for:    Complete by:  As directed      Diet - low sodium heart healthy    Complete by:  As directed      Increase activity slowly    Complete by:  As directed           Current Discharge Medication List    START taking these medications   Details  lidocaine (LIDODERM) 5 % Apply to lower back 12 hours on and 12 hours off. Qty: 30 patch, Refills: 0    oxyCODONE (OXY IR/ROXICODONE) 5 MG immediate release tablet Take 1 tablet (5 mg total) by mouth every 4 (four) hours as needed for severe pain. Qty: 20 tablet, Refills: 0      CONTINUE these medications which have CHANGED   Details  acetaminophen (TYLENOL) 650 MG CR tablet Take two 650 mg Tabs twice daily for pain Qty: 60  tablet, Refills: 0      CONTINUE these medications which have NOT CHANGED   Details  aspirin 81 MG chewable tablet Chew 81 mg by mouth at bedtime.     bisacodyl (DULCOLAX) 5 MG EC tablet Take 2 tablets (10 mg total) by mouth every morning. Qty: 30 tablet, Refills: 0    carbidopa-levodopa (SINEMET CR) 50-200 MG per tablet TAKE 1 TABLET 5 TIMES DAILY Qty: 450 tablet, Refills: 3    CRESTOR 10 MG tablet TAKE 1 TABLET DAILY Qty: 90 tablet, Refills: 1    furosemide (LASIX) 40 MG tablet Take 1 tablet (40 mg total) by mouth 2 (two) times daily. Qty: 30 tablet, Refills: 5    KLOR-CON M20 20 MEQ tablet TAKE 1 TABLET TWICE A  DAY Qty: 180 tablet, Refills: 3    Magnesium Oxide 500 MG TABS Take 500 mg by mouth 2 (two) times daily.    Mirabegron ER (MYRBETRIQ) 25 MG TB24 Take 25 mg by mouth at bedtime.     nitrofurantoin, macrocrystal-monohydrate, (MACROBID) 100 MG capsule Take 100 mg by mouth at bedtime.     polyethylene glycol (MIRALAX / GLYCOLAX) packet Take 17 g by mouth daily. Qty: 14 each, Refills: 0    Probiotic Product (PROBIOTIC DAILY PO) Take 2 capsules by mouth daily. Pt uses Risa-Bid (Bacid caplet)    selegiline (ELDEPRYL) 5 MG capsule Take 5 mg by mouth 2 (two) times daily.    tamsulosin (FLOMAX) 0.4 MG CAPS capsule TAKE 1 CAPSULE DAILY Qty: 90 capsule, Refills: 3    allopurinol (ZYLOPRIM) 100 MG tablet Take 1 tablet (100 mg total) by mouth daily. Qty: 30 tablet, Refills: 5    AMBULATORY NON FORMULARY MEDICATION 1 Units by Does not apply route daily. Set CPAP to 12 Qty: 1 Units, Refills: 0    Elastic Bandages & Supports (WRIST SPLINT/COCK-UP/RIGHT L) MISC 1 Device by Does not apply route daily. Qty: 1 each, Refills: 0       Allergies  Allergen Reactions  . Metoclopramide Other (See Comments)    Interference with Sinemet, blocks dopamine leading to sedation   Follow-up Information    Follow up with [name], MD In 1 week.   Specialty:  Family Medicine    Contact information:   [address] [place] [address] [phone]        The results of significant diagnostics from this hospitalization (including imaging, microbiology, ancillary and laboratory) are listed below for reference.    Significant Diagnostic Studies: Mr Lumbar Spine Wo Contrast  07/20/2015   CLINICAL DATA:  81 year old male with lumbar back pain radiating to the left lower extremity for 2 days. Pain involving the buttock and knee. No known injury. Initial encounter.  EXAM: MRI LUMBAR SPINE WITHOUT CONTRAST  TECHNIQUE: Multiplanar, multisequence MR imaging of the lumbar spine was performed. No intravenous contrast was administered.  COMPARISON:  [facility] Lumbar MRI 07/13/2008.  FINDINGS: Same numbering system as on the comparison, designating normal lumbar segmentation. Lumbar vertebral height has not significantly changed since 2009, although widespread degenerative endplate spurring has progressed. Trace anterolisthesis at L4-L5 appears increased. Mild degenerative appearing left lateral endplate marrow edema at L5 inferiorly. Up to mild degenerative appearing posterior element marrow edema also at L5. T12 benign vertebral body hemangiomas chronic.  Visualized lower thoracic spinal cord is normal with conus medularis at L1-L2.  Large body habitus. Negative visualized abdominal viscera. Mild gaseous or stool distension of the distal colon.  T11-T12: Chronic but progressed central disc protrusion with caudal migration. Mild spinal stenosis, no spinal cord mass effect.  T12-L1: Interval disc desiccation. Mild facet hypertrophy. No significant stenosis.  L1-L2: Left greater than right facet hypertrophy is increased. Stable mild left L1 foraminal stenosis.  L2-L3: Interval disc desiccation, disc space loss, and circumferential disc bulge. Progressed and moderate facet and ligament flavum hypertrophy. Trace facet joint fluid. New multifactorial spinal stenosis is mild  while right lateral recess and right L2 foraminal stenosis is moderate.  L3-L4: Interval progressed disc desiccation and circumferential disc bulge. Moderate to severe facet hypertrophy is increased with right greater than left facet joint fluid. Increased multifactorial mild spinal and right lateral recess stenosis. Increased moderate bilateral L3 foraminal stenosis.  L4-L5: Increase trace anterolisthesis. Progressed and bulky circumferential disc osteophyte  complex. Severe facet and ligament flavum hypertrophy has increased with new right side facet joint fluid. Severe spinal and lateral recess stenosis has progressed. Moderate bilateral L4 foraminal stenosis is increased.  L5-S1: Largely stable disc desiccation and circumferential disc osteophyte complex. Mild lateral recess stenosis does appear increased. No significant spinal stenosis. Mild left L5 foraminal stenosis appears stable.  IMPRESSION: 1. Widespread progression of lumbar spine degeneration since 2009, maximal at L4-L5. 2. Multifactorial severe spinal and lateral recess stenosis at L4-L5 plus moderate biforaminal stenosis. 3. Also increased multifactorial mild to moderate spinal, lateral recess, and foraminal stenosis at L2-L3 and L3-L4. 4. Chronic but increased T11-T12 disc herniation with mild spinal stenosis, no lower thoracic spinal cord mass effect.   Electronically Signed   By: Genevie Ann M.D.   On: 07/20/2015 20:15    Microbiology: No results found for this or any previous visit (from the past 240 hour(s)).   Labs: Basic Metabolic Panel:  Recent Labs Lab 07/19/15 2236 07/20/15 0517  NA 141 140  K 4.1 3.7  CL 103 104  CO2 28 30  GLUCOSE 126* 110*  BUN 27* 27*  CREATININE 0.94 0.90  CALCIUM 9.2 9.3   Liver Function Tests: No results for input(s): AST, ALT, ALKPHOS, BILITOT, PROT, ALBUMIN in the last 168 hours. No results for input(s): LIPASE, AMYLASE in the last 168 hours. No results for input(s): AMMONIA in the last 168  hours. CBC:  Recent Labs Lab 07/19/15 2236 07/20/15 0517  WBC 4.3 5.0  NEUTROABS 2.5  --   HGB 13.4 13.2  HCT 42.0 41.0  MCV 85.2 86.0  PLT 158 142*   Cardiac Enzymes: No results for input(s): CKTOTAL, CKMB, CKMBINDEX, TROPONINI in the last 168 hours. BNP: BNP (last 3 results) No results for input(s): BNP in the last 8760 hours.  ProBNP (last 3 results)  Recent Labs  10/29/14 1108  PROBNP 809.0*    CBG: No results for input(s): GLUCAP in the last 168 hours.     SignedKelvin Cellar  [facility] 07/22/2015, 10:25 AM

## 2015-07-23 ENCOUNTER — Telehealth: Payer: Self-pay | Admitting: *Deleted

## 2015-07-23 NOTE — Telephone Encounter (Signed)
Transition Care Management Follow-up Telephone Call  How have you been since you were released from the hospital? Doing good.  Sleeps a lot.   Do you understand why you were in the hospital? yes   Do you understand the discharge instrcutions? yes  Items Reviewed:  Medications reviewed: yes  Allergies reviewed: yes  Dietary changes reviewed: yes  Referrals reviewed: yes   Functional Questionnaire:   Activities of Daily Living (ADLs):   He states they are independent in the following: ambulation, bathing and hygiene, feeding, continence, grooming, toileting, dressing and will have home health to assist States they require assistance with the following: home health will assist   Any transportation issues/concerns?: wife will drive patient   Any patient concerns? no   Confirmed importance and date/time of follow-up visits scheduled: yes   Confirmed with patient if condition begins to worsen call PCP or go to the ER.  Patient was given the [place] line [phone]: yes Information given by wife Discharge on 07/22/15 Patient was discharged to his home Patient has an appointment on 07/29/15 with Dr Yong Channel

## 2015-07-24 ENCOUNTER — Telehealth: Payer: Self-pay | Admitting: Family Medicine

## 2015-07-24 DIAGNOSIS — I10 Essential (primary) hypertension: Secondary | ICD-10-CM | POA: Diagnosis not present

## 2015-07-24 DIAGNOSIS — M5106 Intervertebral disc disorders with myelopathy, lumbar region: Secondary | ICD-10-CM | POA: Diagnosis not present

## 2015-07-24 DIAGNOSIS — M5432 Sciatica, left side: Secondary | ICD-10-CM | POA: Diagnosis not present

## 2015-07-24 DIAGNOSIS — I5032 Chronic diastolic (congestive) heart failure: Secondary | ICD-10-CM | POA: Diagnosis not present

## 2015-07-24 DIAGNOSIS — G20 Parkinson's disease: Secondary | ICD-10-CM | POA: Diagnosis not present

## 2015-07-24 DIAGNOSIS — M4806 Spinal stenosis, lumbar region: Secondary | ICD-10-CM | POA: Diagnosis not present

## 2015-07-24 NOTE — Telephone Encounter (Signed)
Walter Gill from [facility] called regarding orders for this patient OT Pt, RN and home health aide. Pt was discharged from [facility] The need a verbal order to provide care.

## 2015-07-24 NOTE — Telephone Encounter (Signed)
The number provided for Homehealth is not a valid number, do you have the correct number?

## 2015-07-24 NOTE — Telephone Encounter (Signed)
Ok to give verbal 

## 2015-07-27 NOTE — Telephone Encounter (Signed)
Yes if they do not need a face to face. Cannot provide face to face without visit and I know we are rescheduling upcoming visit.

## 2015-07-27 NOTE — Telephone Encounter (Signed)
Walter Gill  # is [phone]

## 2015-07-27 NOTE — Telephone Encounter (Signed)
Ok to give verbal 

## 2015-07-28 ENCOUNTER — Ambulatory Visit: Payer: Medicare Other | Admitting: Family Medicine

## 2015-07-28 ENCOUNTER — Telehealth: Payer: Self-pay | Admitting: Family Medicine

## 2015-07-28 DIAGNOSIS — M5432 Sciatica, left side: Secondary | ICD-10-CM | POA: Diagnosis not present

## 2015-07-28 DIAGNOSIS — M5106 Intervertebral disc disorders with myelopathy, lumbar region: Secondary | ICD-10-CM | POA: Diagnosis not present

## 2015-07-28 DIAGNOSIS — G20 Parkinson's disease: Secondary | ICD-10-CM | POA: Diagnosis not present

## 2015-07-28 DIAGNOSIS — I10 Essential (primary) hypertension: Secondary | ICD-10-CM | POA: Diagnosis not present

## 2015-07-28 DIAGNOSIS — I5032 Chronic diastolic (congestive) heart failure: Secondary | ICD-10-CM | POA: Diagnosis not present

## 2015-07-28 DIAGNOSIS — M4806 Spinal stenosis, lumbar region: Secondary | ICD-10-CM | POA: Diagnosis not present

## 2015-07-28 NOTE — Telephone Encounter (Signed)
Is this for a verbal?

## 2015-07-28 NOTE — Telephone Encounter (Signed)
Verbal order given to [facility].

## 2015-07-28 NOTE — Telephone Encounter (Signed)
Frequency needs to be 2 x a wk for 8 weeks for back pain mgmt as well as an order for the [employer] program for balance and fall prevention.

## 2015-07-29 DIAGNOSIS — M4806 Spinal stenosis, lumbar region: Secondary | ICD-10-CM | POA: Diagnosis not present

## 2015-07-29 DIAGNOSIS — I10 Essential (primary) hypertension: Secondary | ICD-10-CM | POA: Diagnosis not present

## 2015-07-29 DIAGNOSIS — M5106 Intervertebral disc disorders with myelopathy, lumbar region: Secondary | ICD-10-CM | POA: Diagnosis not present

## 2015-07-29 DIAGNOSIS — G20 Parkinson's disease: Secondary | ICD-10-CM | POA: Diagnosis not present

## 2015-07-29 DIAGNOSIS — M5432 Sciatica, left side: Secondary | ICD-10-CM | POA: Diagnosis not present

## 2015-07-29 DIAGNOSIS — I5032 Chronic diastolic (congestive) heart failure: Secondary | ICD-10-CM | POA: Diagnosis not present

## 2015-07-29 NOTE — Telephone Encounter (Signed)
Yes, forgive my terrible message relay please!

## 2015-07-29 NOTE — Telephone Encounter (Signed)
Returned [employer] with verbal.

## 2015-07-30 DIAGNOSIS — M5432 Sciatica, left side: Secondary | ICD-10-CM | POA: Diagnosis not present

## 2015-07-30 DIAGNOSIS — M5106 Intervertebral disc disorders with myelopathy, lumbar region: Secondary | ICD-10-CM | POA: Diagnosis not present

## 2015-07-30 DIAGNOSIS — I5032 Chronic diastolic (congestive) heart failure: Secondary | ICD-10-CM | POA: Diagnosis not present

## 2015-07-30 DIAGNOSIS — I10 Essential (primary) hypertension: Secondary | ICD-10-CM | POA: Diagnosis not present

## 2015-07-30 DIAGNOSIS — M4806 Spinal stenosis, lumbar region: Secondary | ICD-10-CM | POA: Diagnosis not present

## 2015-07-30 DIAGNOSIS — G20 Parkinson's disease: Secondary | ICD-10-CM | POA: Diagnosis not present

## 2015-07-31 ENCOUNTER — Ambulatory Visit (INDEPENDENT_AMBULATORY_CARE_PROVIDER_SITE_OTHER): Payer: Medicare Other | Admitting: Family Medicine

## 2015-07-31 VITALS — BP 122/78 | HR 75 | Temp 97.6°F | Wt 246.0 lb

## 2015-07-31 DIAGNOSIS — M5432 Sciatica, left side: Secondary | ICD-10-CM | POA: Diagnosis not present

## 2015-07-31 DIAGNOSIS — G20 Parkinson's disease: Secondary | ICD-10-CM | POA: Diagnosis not present

## 2015-07-31 DIAGNOSIS — M48061 Spinal stenosis, lumbar region without neurogenic claudication: Secondary | ICD-10-CM

## 2015-07-31 DIAGNOSIS — G894 Chronic pain syndrome: Secondary | ICD-10-CM | POA: Diagnosis not present

## 2015-07-31 DIAGNOSIS — M4806 Spinal stenosis, lumbar region: Secondary | ICD-10-CM | POA: Diagnosis not present

## 2015-07-31 NOTE — Patient Instructions (Signed)
I am glad things are going in right direction. I agree working with therapy to strengthen all your core muscles is the best thing for you now. Glad pain doing reasonable on lidocaine and tylenol. Let me know if you need refills. Sparing oxycodone- as little as possible is best as you know due to history constipation.   Let's check in 5-6 weeks from now to reassess and see if we want to consider repeat visit to Dr. Nelva Bush or potentially pain management

## 2015-07-31 NOTE — Progress Notes (Signed)
Walter Reddish, MD  Subjective:  Walter Gill is a 81 y.o. year old very pleasant male patient who presents for transitional care management and hospital follow up for intractable pain from sciatica. Patient was hospitalized from 07/19/15 to 07/22/15. A TCM phone call was completed on 07/23/15. Medical complexity moderate  REview of discharge summary: 10 M with parkinsons, OSA, HLD, diastolic CHF presented to ED with new onset left lowre extremity pain. Difficulty walking on leg. Pain in left buttocks radiating to the knee. No trauma or fall. Due to inability to ambulate was admitted to the hospital. Patient already has known baseline urinary incontinence with no changes or retention during stay. Patient had a straight leg raise positive test. He had an MRI on 07/20/15 showing widespread DDD and progression since 2009 most obvious at L4,L5, severe spinal and lateral recess stenosis L4-L5 with moderate biforaminal stenosis. Chronic disc herniation t11-T12 stabe with mild stenosis. Stenosis noted L2-L3 and L3-L4 as well not as severe. He was treated with narcotics as needed and transitioned to scheduled tylenol before departure as well as a lidocaine patch for 12 hours a day. He was given oxycodone for severe pain- same schedule he is on now. Home health services set up before he left. Labs were unremarkable on 8/8.   Patient's parkinson's was managed with home carbidopa/levodopa. Chronic diastolic CHF remained compensated with daily lasix through edema persisted (venous stasis as well). Treated with his CPAP while hospitalized for his OSA as well.  Report from patient: Felt like knee was going to collapse day he was hospitalized, weakness in his legs. He has done well with PT and OT and feels like he is progressing. Pain in his back down from 9/10 to 6/10 now. Pain in legs down from 6 to 3. Oxycodone used 5 pills at home then came off, started to have abdominal discomfort.Has had steroid shot for back pain in  past but did not have sciatica at that time.   ROS- denies leg weakness at this time, no fever, chills, nausea, vomiting, no changes in incontinence, history of DVT/PE, constipation on narcotics  Past Medical History- parkinsons disease, chronic pain syndrome previously on regular narcotics- unclear if from spinal stenosis or rigidity from parkinsons, baselne urinary incontinence  Medications- reviewed and updated Current Outpatient Prescriptions  Medication Sig Dispense Refill  . allopurinol (ZYLOPRIM) 100 MG tablet Take 1 tablet (100 mg total) by mouth daily. 30 tablet 5  . AMBULATORY NON FORMULARY MEDICATION 1 Units by Does not apply route daily. Set CPAP to 12 1 Units 0  . aspirin 81 MG chewable tablet Chew 81 mg by mouth at bedtime.     . bisacodyl (DULCOLAX) 5 MG EC tablet Take 2 tablets (10 mg total) by mouth every morning. 30 tablet 0  . carbidopa-levodopa (SINEMET CR) 50-200 MG per tablet TAKE 1 TABLET 5 TIMES DAILY 450 tablet 3  . CRESTOR 10 MG tablet TAKE 1 TABLET DAILY (Patient taking differently: TAKE 1 TABLET BY MOUTH DAILY) 90 tablet 1  . Elastic Bandages & Supports (WRIST SPLINT/COCK-UP/RIGHT L) MISC 1 Device by Does not apply route daily. 1 each 0  . furosemide (LASIX) 40 MG tablet Take 1 tablet (40 mg total) by mouth 2 (two) times daily. 30 tablet 5  . KLOR-CON M20 20 MEQ tablet TAKE 1 TABLET TWICE A DAY (Patient taking differently: TAKE 1 TABLET BY MOUTH TWICE A DAY) 180 tablet 3  . lidocaine (LIDODERM) 5 % Apply to lower back 12 hours on  and 12 hours off. 30 patch 0  . Mirabegron ER (MYRBETRIQ) 25 MG TB24 Take 25 mg by mouth at bedtime.     . nitrofurantoin, macrocrystal-monohydrate, (MACROBID) 100 MG capsule Take 100 mg by mouth at bedtime.     . polyethylene glycol (MIRALAX / GLYCOLAX) packet Take 17 g by mouth daily. 14 each 0  . Probiotic Product (PROBIOTIC DAILY PO) Take 2 capsules by mouth daily. Pt uses Risa-Bid (Bacid caplet)    . selegiline (ELDEPRYL) 5 MG capsule  Take 5 mg by mouth 2 (two) times daily.    . tamsulosin (FLOMAX) 0.4 MG CAPS capsule TAKE 1 CAPSULE DAILY 90 capsule 3  . acetaminophen (TYLENOL) 650 MG CR tablet Take two 650 mg Tabs twice daily for pain (Patient not taking: Reported on 07/31/2015) 60 tablet 0  . Magnesium Oxide 500 MG TABS Take 500 mg by mouth 2 (two) times daily.     No current facility-administered medications for this visit.    Objective: BP 122/78 mmHg  Pulse 75  Temp(Src) 97.6 F (36.4 C)  Wt 246 lb (111.585 kg) Gen: NAD, resting comfortably CV: RRR no murmurs rubs or gallops Lungs: CTAB no crackles, wheeze, rhonchi Abdomen: soft/nontender/nondistended/normal bowel sounds. No rebound or guarding.  Ext: 2+ edema under compression stockings Skin: warm, dry, no rash Some pain in paraspinous muscles with palpation, no CVA tenderness Neuro: grossly normal, moves all extremities, 5/5 strength lower extremities, walk with walker per baseline   Assessment/Plan:  Chronic pain syndrome Related GY:KZLDJT stenosis of lumbar region and PARKINSON'S DISEASE With new onset Sciatica, left Patient with prolonged hospitalization due to inability to ambulate due to new onset sciatica. He is improving at this point with PT/OT. Pain control with tylenol and lidocaine. Not using percocet at this point. Has had about 33% improvement. Seems to be largely tolerable. We had a long discussion that the sciatica portion possibly could respond to steroid injections but doubted back pain would. Could discuss other options with neurosurgery. Dr. Carles Collet in past through patient report has said surgery likely would not help. We are planning on 4 week follow up and depending on severity of sciatica and back pain decide between consultation with pain management or neurosurgery likely. Narcotics need to be avoided due to severe issues with constipation.   Return precautions advised.

## 2015-08-01 ENCOUNTER — Encounter: Payer: Self-pay | Admitting: Family Medicine

## 2015-08-01 DIAGNOSIS — M48061 Spinal stenosis, lumbar region without neurogenic claudication: Secondary | ICD-10-CM | POA: Insufficient documentation

## 2015-08-04 DIAGNOSIS — M5106 Intervertebral disc disorders with myelopathy, lumbar region: Secondary | ICD-10-CM | POA: Diagnosis not present

## 2015-08-04 DIAGNOSIS — I10 Essential (primary) hypertension: Secondary | ICD-10-CM | POA: Diagnosis not present

## 2015-08-04 DIAGNOSIS — I5032 Chronic diastolic (congestive) heart failure: Secondary | ICD-10-CM | POA: Diagnosis not present

## 2015-08-04 DIAGNOSIS — M5432 Sciatica, left side: Secondary | ICD-10-CM | POA: Diagnosis not present

## 2015-08-04 DIAGNOSIS — G20 Parkinson's disease: Secondary | ICD-10-CM | POA: Diagnosis not present

## 2015-08-04 DIAGNOSIS — M4806 Spinal stenosis, lumbar region: Secondary | ICD-10-CM | POA: Diagnosis not present

## 2015-08-06 DIAGNOSIS — I5032 Chronic diastolic (congestive) heart failure: Secondary | ICD-10-CM | POA: Diagnosis not present

## 2015-08-06 DIAGNOSIS — M5432 Sciatica, left side: Secondary | ICD-10-CM | POA: Diagnosis not present

## 2015-08-06 DIAGNOSIS — M5106 Intervertebral disc disorders with myelopathy, lumbar region: Secondary | ICD-10-CM | POA: Diagnosis not present

## 2015-08-06 DIAGNOSIS — G20 Parkinson's disease: Secondary | ICD-10-CM | POA: Diagnosis not present

## 2015-08-06 DIAGNOSIS — I10 Essential (primary) hypertension: Secondary | ICD-10-CM | POA: Diagnosis not present

## 2015-08-06 DIAGNOSIS — M4806 Spinal stenosis, lumbar region: Secondary | ICD-10-CM | POA: Diagnosis not present

## 2015-08-07 DIAGNOSIS — G20 Parkinson's disease: Secondary | ICD-10-CM | POA: Diagnosis not present

## 2015-08-07 DIAGNOSIS — I10 Essential (primary) hypertension: Secondary | ICD-10-CM | POA: Diagnosis not present

## 2015-08-07 DIAGNOSIS — M5432 Sciatica, left side: Secondary | ICD-10-CM | POA: Diagnosis not present

## 2015-08-07 DIAGNOSIS — M5106 Intervertebral disc disorders with myelopathy, lumbar region: Secondary | ICD-10-CM | POA: Diagnosis not present

## 2015-08-07 DIAGNOSIS — I5032 Chronic diastolic (congestive) heart failure: Secondary | ICD-10-CM | POA: Diagnosis not present

## 2015-08-07 DIAGNOSIS — M4806 Spinal stenosis, lumbar region: Secondary | ICD-10-CM | POA: Diagnosis not present

## 2015-08-10 ENCOUNTER — Ambulatory Visit: Payer: Medicare Other | Admitting: Family Medicine

## 2015-08-11 DIAGNOSIS — M5432 Sciatica, left side: Secondary | ICD-10-CM | POA: Diagnosis not present

## 2015-08-11 DIAGNOSIS — M5106 Intervertebral disc disorders with myelopathy, lumbar region: Secondary | ICD-10-CM | POA: Diagnosis not present

## 2015-08-11 DIAGNOSIS — G20 Parkinson's disease: Secondary | ICD-10-CM | POA: Diagnosis not present

## 2015-08-11 DIAGNOSIS — M4806 Spinal stenosis, lumbar region: Secondary | ICD-10-CM | POA: Diagnosis not present

## 2015-08-11 DIAGNOSIS — I10 Essential (primary) hypertension: Secondary | ICD-10-CM | POA: Diagnosis not present

## 2015-08-11 DIAGNOSIS — I5032 Chronic diastolic (congestive) heart failure: Secondary | ICD-10-CM | POA: Diagnosis not present

## 2015-08-12 DIAGNOSIS — M5432 Sciatica, left side: Secondary | ICD-10-CM | POA: Diagnosis not present

## 2015-08-12 DIAGNOSIS — M4806 Spinal stenosis, lumbar region: Secondary | ICD-10-CM | POA: Diagnosis not present

## 2015-08-12 DIAGNOSIS — I10 Essential (primary) hypertension: Secondary | ICD-10-CM | POA: Diagnosis not present

## 2015-08-12 DIAGNOSIS — I5032 Chronic diastolic (congestive) heart failure: Secondary | ICD-10-CM | POA: Diagnosis not present

## 2015-08-12 DIAGNOSIS — M5106 Intervertebral disc disorders with myelopathy, lumbar region: Secondary | ICD-10-CM | POA: Diagnosis not present

## 2015-08-12 DIAGNOSIS — G20 Parkinson's disease: Secondary | ICD-10-CM | POA: Diagnosis not present

## 2015-08-13 DIAGNOSIS — M4806 Spinal stenosis, lumbar region: Secondary | ICD-10-CM | POA: Diagnosis not present

## 2015-08-13 DIAGNOSIS — I10 Essential (primary) hypertension: Secondary | ICD-10-CM | POA: Diagnosis not present

## 2015-08-13 DIAGNOSIS — G20 Parkinson's disease: Secondary | ICD-10-CM | POA: Diagnosis not present

## 2015-08-13 DIAGNOSIS — M5106 Intervertebral disc disorders with myelopathy, lumbar region: Secondary | ICD-10-CM | POA: Diagnosis not present

## 2015-08-13 DIAGNOSIS — M5432 Sciatica, left side: Secondary | ICD-10-CM | POA: Diagnosis not present

## 2015-08-13 DIAGNOSIS — I5032 Chronic diastolic (congestive) heart failure: Secondary | ICD-10-CM | POA: Diagnosis not present

## 2015-08-14 ENCOUNTER — Telehealth: Payer: Self-pay | Admitting: Family Medicine

## 2015-08-14 DIAGNOSIS — M4806 Spinal stenosis, lumbar region: Secondary | ICD-10-CM | POA: Diagnosis not present

## 2015-08-14 DIAGNOSIS — I10 Essential (primary) hypertension: Secondary | ICD-10-CM | POA: Diagnosis not present

## 2015-08-14 DIAGNOSIS — I5032 Chronic diastolic (congestive) heart failure: Secondary | ICD-10-CM | POA: Diagnosis not present

## 2015-08-14 DIAGNOSIS — M5106 Intervertebral disc disorders with myelopathy, lumbar region: Secondary | ICD-10-CM | POA: Diagnosis not present

## 2015-08-14 DIAGNOSIS — G20 Parkinson's disease: Secondary | ICD-10-CM | POA: Diagnosis not present

## 2015-08-14 DIAGNOSIS — M5432 Sciatica, left side: Secondary | ICD-10-CM | POA: Diagnosis not present

## 2015-08-14 NOTE — Telephone Encounter (Signed)
Verbal is ok. Thanks

## 2015-08-14 NOTE — Telephone Encounter (Signed)
Arbie Cookey from [place] is calling to report pt has stage 2 pressure ulcer on buttock. Arbie Cookey would like verbal order to put opti foam dressing on pt. Arbie Cookey is aware dr hunter not in office today

## 2015-08-14 NOTE — Telephone Encounter (Signed)
Verbal orders given to [facility]

## 2015-08-17 DIAGNOSIS — G20 Parkinson's disease: Secondary | ICD-10-CM | POA: Diagnosis not present

## 2015-08-17 DIAGNOSIS — M5432 Sciatica, left side: Secondary | ICD-10-CM | POA: Diagnosis not present

## 2015-08-17 DIAGNOSIS — I5032 Chronic diastolic (congestive) heart failure: Secondary | ICD-10-CM | POA: Diagnosis not present

## 2015-08-17 DIAGNOSIS — M5106 Intervertebral disc disorders with myelopathy, lumbar region: Secondary | ICD-10-CM | POA: Diagnosis not present

## 2015-08-17 DIAGNOSIS — I10 Essential (primary) hypertension: Secondary | ICD-10-CM | POA: Diagnosis not present

## 2015-08-17 DIAGNOSIS — M4806 Spinal stenosis, lumbar region: Secondary | ICD-10-CM | POA: Diagnosis not present

## 2015-08-18 DIAGNOSIS — G20 Parkinson's disease: Secondary | ICD-10-CM | POA: Diagnosis not present

## 2015-08-18 DIAGNOSIS — M4806 Spinal stenosis, lumbar region: Secondary | ICD-10-CM | POA: Diagnosis not present

## 2015-08-18 DIAGNOSIS — M5106 Intervertebral disc disorders with myelopathy, lumbar region: Secondary | ICD-10-CM | POA: Diagnosis not present

## 2015-08-18 DIAGNOSIS — M5432 Sciatica, left side: Secondary | ICD-10-CM | POA: Diagnosis not present

## 2015-08-18 DIAGNOSIS — I5032 Chronic diastolic (congestive) heart failure: Secondary | ICD-10-CM | POA: Diagnosis not present

## 2015-08-18 DIAGNOSIS — I10 Essential (primary) hypertension: Secondary | ICD-10-CM | POA: Diagnosis not present

## 2015-08-19 ENCOUNTER — Other Ambulatory Visit: Payer: Self-pay | Admitting: *Deleted

## 2015-08-19 DIAGNOSIS — M4806 Spinal stenosis, lumbar region: Secondary | ICD-10-CM | POA: Diagnosis not present

## 2015-08-19 DIAGNOSIS — I10 Essential (primary) hypertension: Secondary | ICD-10-CM | POA: Diagnosis not present

## 2015-08-19 DIAGNOSIS — M5432 Sciatica, left side: Secondary | ICD-10-CM | POA: Diagnosis not present

## 2015-08-19 DIAGNOSIS — G20 Parkinson's disease: Secondary | ICD-10-CM | POA: Diagnosis not present

## 2015-08-19 DIAGNOSIS — I5032 Chronic diastolic (congestive) heart failure: Secondary | ICD-10-CM | POA: Diagnosis not present

## 2015-08-19 DIAGNOSIS — M5106 Intervertebral disc disorders with myelopathy, lumbar region: Secondary | ICD-10-CM | POA: Diagnosis not present

## 2015-08-19 MED ORDER — FUROSEMIDE 40 MG PO TABS: 40.0000 mg | ORAL_TABLET | Freq: Two times a day (BID) | ORAL | Status: DC

## 2015-08-20 ENCOUNTER — Ambulatory Visit (INDEPENDENT_AMBULATORY_CARE_PROVIDER_SITE_OTHER): Payer: Medicare Other | Admitting: Neurology

## 2015-08-20 ENCOUNTER — Encounter: Payer: Self-pay | Admitting: Neurology

## 2015-08-20 VITALS — BP 148/76 | HR 91 | Ht 68.0 in | Wt 249.0 lb

## 2015-08-20 DIAGNOSIS — G20 Parkinson's disease: Secondary | ICD-10-CM

## 2015-08-20 DIAGNOSIS — M4806 Spinal stenosis, lumbar region: Secondary | ICD-10-CM | POA: Diagnosis not present

## 2015-08-20 DIAGNOSIS — E538 Deficiency of other specified B group vitamins: Secondary | ICD-10-CM | POA: Diagnosis not present

## 2015-08-20 DIAGNOSIS — I10 Essential (primary) hypertension: Secondary | ICD-10-CM | POA: Diagnosis not present

## 2015-08-20 DIAGNOSIS — M5442 Lumbago with sciatica, left side: Secondary | ICD-10-CM

## 2015-08-20 DIAGNOSIS — G4752 REM sleep behavior disorder: Secondary | ICD-10-CM

## 2015-08-20 DIAGNOSIS — M5106 Intervertebral disc disorders with myelopathy, lumbar region: Secondary | ICD-10-CM | POA: Diagnosis not present

## 2015-08-20 DIAGNOSIS — I5032 Chronic diastolic (congestive) heart failure: Secondary | ICD-10-CM | POA: Diagnosis not present

## 2015-08-20 DIAGNOSIS — M5432 Sciatica, left side: Secondary | ICD-10-CM | POA: Diagnosis not present

## 2015-08-20 MED ORDER — CYANOCOBALAMIN 1000 MCG/ML IJ SOLN
1000.0000 ug | Freq: Once | INTRAMUSCULAR | Status: AC
  Administered 2015-08-20: 1000 ug via INTRAMUSCULAR
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
## 2015-08-21 DIAGNOSIS — I10 Essential (primary) hypertension: Secondary | ICD-10-CM | POA: Diagnosis not present

## 2015-08-21 DIAGNOSIS — M4806 Spinal stenosis, lumbar region: Secondary | ICD-10-CM | POA: Diagnosis not present

## 2015-08-21 DIAGNOSIS — M5432 Sciatica, left side: Secondary | ICD-10-CM | POA: Diagnosis not present

## 2015-08-21 DIAGNOSIS — M5106 Intervertebral disc disorders with myelopathy, lumbar region: Secondary | ICD-10-CM | POA: Diagnosis not present

## 2015-08-21 DIAGNOSIS — G20 Parkinson's disease: Secondary | ICD-10-CM | POA: Diagnosis not present

## 2015-08-21 DIAGNOSIS — I5032 Chronic diastolic (congestive) heart failure: Secondary | ICD-10-CM | POA: Diagnosis not present

## 2015-08-24 ENCOUNTER — Telehealth: Payer: Self-pay | Admitting: Family Medicine

## 2015-08-24 DIAGNOSIS — M4806 Spinal stenosis, lumbar region: Secondary | ICD-10-CM | POA: Diagnosis not present

## 2015-08-24 DIAGNOSIS — I5032 Chronic diastolic (congestive) heart failure: Secondary | ICD-10-CM | POA: Diagnosis not present

## 2015-08-24 DIAGNOSIS — M5432 Sciatica, left side: Secondary | ICD-10-CM | POA: Diagnosis not present

## 2015-08-24 DIAGNOSIS — I10 Essential (primary) hypertension: Secondary | ICD-10-CM | POA: Diagnosis not present

## 2015-08-24 DIAGNOSIS — G20 Parkinson's disease: Secondary | ICD-10-CM | POA: Diagnosis not present

## 2015-08-24 DIAGNOSIS — M5106 Intervertebral disc disorders with myelopathy, lumbar region: Secondary | ICD-10-CM | POA: Diagnosis not present

## 2015-08-24 MED ORDER — LIDOCAINE 5 % EX PTCH: MEDICATED_PATCH | CUTANEOUS | Status: DC

## 2015-08-24 NOTE — Telephone Encounter (Signed)
Medication sent in. 

## 2015-08-24 NOTE — Telephone Encounter (Signed)
Pt needs new rx lidocaine patches send to [facility]. This med was prescribed by  md in hospital

## 2015-08-24 NOTE — Telephone Encounter (Signed)
Ok to refill 

## 2015-08-24 NOTE — Telephone Encounter (Signed)
Yes refill fine

## 2015-08-26 DIAGNOSIS — I10 Essential (primary) hypertension: Secondary | ICD-10-CM | POA: Diagnosis not present

## 2015-08-26 DIAGNOSIS — M4806 Spinal stenosis, lumbar region: Secondary | ICD-10-CM | POA: Diagnosis not present

## 2015-08-26 DIAGNOSIS — I5032 Chronic diastolic (congestive) heart failure: Secondary | ICD-10-CM | POA: Diagnosis not present

## 2015-08-26 DIAGNOSIS — M5432 Sciatica, left side: Secondary | ICD-10-CM | POA: Diagnosis not present

## 2015-08-26 DIAGNOSIS — G20 Parkinson's disease: Secondary | ICD-10-CM | POA: Diagnosis not present

## 2015-08-26 DIAGNOSIS — M5106 Intervertebral disc disorders with myelopathy, lumbar region: Secondary | ICD-10-CM | POA: Diagnosis not present

## 2015-08-28 DIAGNOSIS — I10 Essential (primary) hypertension: Secondary | ICD-10-CM | POA: Diagnosis not present

## 2015-08-28 DIAGNOSIS — I5032 Chronic diastolic (congestive) heart failure: Secondary | ICD-10-CM | POA: Diagnosis not present

## 2015-08-28 DIAGNOSIS — M5432 Sciatica, left side: Secondary | ICD-10-CM | POA: Diagnosis not present

## 2015-08-28 DIAGNOSIS — G20 Parkinson's disease: Secondary | ICD-10-CM | POA: Diagnosis not present

## 2015-08-28 DIAGNOSIS — M5106 Intervertebral disc disorders with myelopathy, lumbar region: Secondary | ICD-10-CM | POA: Diagnosis not present

## 2015-08-28 DIAGNOSIS — M4806 Spinal stenosis, lumbar region: Secondary | ICD-10-CM | POA: Diagnosis not present

## 2015-08-31 DIAGNOSIS — I5032 Chronic diastolic (congestive) heart failure: Secondary | ICD-10-CM | POA: Diagnosis not present

## 2015-08-31 DIAGNOSIS — M5432 Sciatica, left side: Secondary | ICD-10-CM | POA: Diagnosis not present

## 2015-08-31 DIAGNOSIS — M4806 Spinal stenosis, lumbar region: Secondary | ICD-10-CM | POA: Diagnosis not present

## 2015-08-31 DIAGNOSIS — M5106 Intervertebral disc disorders with myelopathy, lumbar region: Secondary | ICD-10-CM | POA: Diagnosis not present

## 2015-08-31 DIAGNOSIS — G20 Parkinson's disease: Secondary | ICD-10-CM | POA: Diagnosis not present

## 2015-08-31 DIAGNOSIS — I10 Essential (primary) hypertension: Secondary | ICD-10-CM | POA: Diagnosis not present

## 2015-09-02 DIAGNOSIS — M4806 Spinal stenosis, lumbar region: Secondary | ICD-10-CM | POA: Diagnosis not present

## 2015-09-02 DIAGNOSIS — G20 Parkinson's disease: Secondary | ICD-10-CM | POA: Diagnosis not present

## 2015-09-02 DIAGNOSIS — M5106 Intervertebral disc disorders with myelopathy, lumbar region: Secondary | ICD-10-CM | POA: Diagnosis not present

## 2015-09-02 DIAGNOSIS — M5432 Sciatica, left side: Secondary | ICD-10-CM | POA: Diagnosis not present

## 2015-09-02 DIAGNOSIS — I5032 Chronic diastolic (congestive) heart failure: Secondary | ICD-10-CM | POA: Diagnosis not present

## 2015-09-02 DIAGNOSIS — I10 Essential (primary) hypertension: Secondary | ICD-10-CM | POA: Diagnosis not present

## 2015-09-04 DIAGNOSIS — I5032 Chronic diastolic (congestive) heart failure: Secondary | ICD-10-CM | POA: Diagnosis not present

## 2015-09-04 DIAGNOSIS — M5432 Sciatica, left side: Secondary | ICD-10-CM | POA: Diagnosis not present

## 2015-09-04 DIAGNOSIS — I10 Essential (primary) hypertension: Secondary | ICD-10-CM | POA: Diagnosis not present

## 2015-09-04 DIAGNOSIS — M5106 Intervertebral disc disorders with myelopathy, lumbar region: Secondary | ICD-10-CM | POA: Diagnosis not present

## 2015-09-04 DIAGNOSIS — M4806 Spinal stenosis, lumbar region: Secondary | ICD-10-CM | POA: Diagnosis not present

## 2015-09-04 DIAGNOSIS — G20 Parkinson's disease: Secondary | ICD-10-CM | POA: Diagnosis not present

## 2015-09-07 DIAGNOSIS — I5032 Chronic diastolic (congestive) heart failure: Secondary | ICD-10-CM | POA: Diagnosis not present

## 2015-09-07 DIAGNOSIS — G20 Parkinson's disease: Secondary | ICD-10-CM | POA: Diagnosis not present

## 2015-09-07 DIAGNOSIS — I10 Essential (primary) hypertension: Secondary | ICD-10-CM | POA: Diagnosis not present

## 2015-09-07 DIAGNOSIS — M4806 Spinal stenosis, lumbar region: Secondary | ICD-10-CM | POA: Diagnosis not present

## 2015-09-07 DIAGNOSIS — M5106 Intervertebral disc disorders with myelopathy, lumbar region: Secondary | ICD-10-CM | POA: Diagnosis not present

## 2015-09-07 DIAGNOSIS — M5432 Sciatica, left side: Secondary | ICD-10-CM | POA: Diagnosis not present

## 2015-09-09 ENCOUNTER — Encounter: Payer: Self-pay | Admitting: Family Medicine

## 2015-09-09 ENCOUNTER — Ambulatory Visit (INDEPENDENT_AMBULATORY_CARE_PROVIDER_SITE_OTHER): Payer: Medicare Other | Admitting: Family Medicine

## 2015-09-09 VITALS — BP 140/70 | HR 75 | Temp 97.4°F | Wt 246.0 lb

## 2015-09-09 DIAGNOSIS — I5032 Chronic diastolic (congestive) heart failure: Secondary | ICD-10-CM | POA: Diagnosis not present

## 2015-09-09 DIAGNOSIS — M4806 Spinal stenosis, lumbar region: Secondary | ICD-10-CM

## 2015-09-09 DIAGNOSIS — M48061 Spinal stenosis, lumbar region without neurogenic claudication: Secondary | ICD-10-CM

## 2015-09-09 DIAGNOSIS — Z23 Encounter for immunization: Secondary | ICD-10-CM

## 2015-09-09 NOTE — Assessment & Plan Note (Signed)
S: weight stable- no orthopnea or PND. Edema stable at 2+ A/P: we discussed trying to help mobilize some fluid with BID dosing short term but patient declines at this time. Diastolic CHF appears stable. Hesitant to add new BP medication but truly would rather have <140/90

## 2015-09-09 NOTE — Progress Notes (Signed)
Walter Reddish, MD  Subjective:  Walter Gill is a 81 y.o. year old very pleasant male patient who presents for/with See problem oriented charting ROS- no chest pain or shortness of breath, no worsening edema. No recent weight gain  Past Medical History-  Patient Active Problem List   Diagnosis Date Noted  . Spinal stenosis of lumbar region 08/01/2015    Priority: High  . Chronic diastolic heart failure 36/14/4315    Priority: High  . Chronic pain syndrome 10/01/2014    Priority: High  . PARKINSON'S DISEASE 07/02/2007    Priority: High  . Urinary incontinence 01/14/2015    Priority: Medium  . Hyperlipidemia 10/29/2014    Priority: Medium  . Constipation 10/24/2014    Priority: Medium  . OSA on CPAP 04/17/2013    Priority: Medium  . DVT, HX OF 10/16/2007    Priority: Medium  . PULMONARY EMBOLISM 10/08/2007    Priority: Medium  . Gout 07/02/2007    Priority: Medium  . B12 deficiency 06/06/2013    Priority: Low  . REM behavioral disorder 10/25/2012    Priority: Low  . RBBB 07/02/2007    Priority: Low  . Osteoarthritis 07/02/2007    Priority: Low    Medications- reviewed and updated Current Outpatient Prescriptions  Medication Sig Dispense Refill  . allopurinol (ZYLOPRIM) 100 MG tablet Take 1 tablet (100 mg total) by mouth daily. 30 tablet 5  . aspirin 81 MG chewable tablet Chew 81 mg by mouth at bedtime.     . bisacodyl (DULCOLAX) 5 MG EC tablet Take 2 tablets (10 mg total) by mouth every morning. 30 tablet 0  . carbidopa-levodopa (SINEMET CR) 50-200 MG per tablet TAKE 1 TABLET 5 TIMES DAILY 450 tablet 3  . CRESTOR 10 MG tablet TAKE 1 TABLET DAILY (Patient taking differently: TAKE 1 TABLET BY MOUTH DAILY) 90 tablet 1  . furosemide (LASIX) 40 MG tablet Take 1 tablet (40 mg total) by mouth 2 (two) times daily. 180 tablet 1  . KLOR-CON M20 20 MEQ tablet TAKE 1 TABLET TWICE A DAY (Patient taking differently: TAKE 1 TABLET BY MOUTH TWICE A DAY) 180 tablet 3  . lidocaine  (LIDODERM) 5 % Apply to lower back 12 hours on and 12 hours off. 30 patch 2  . Magnesium Oxide 500 MG TABS Take 500 mg by mouth 2 (two) times daily.    . Mirabegron ER (MYRBETRIQ) 25 MG TB24 Take 25 mg by mouth at bedtime.     . nitrofurantoin, macrocrystal-monohydrate, (MACROBID) 100 MG capsule Take 100 mg by mouth at bedtime.     . polyethylene glycol (MIRALAX / GLYCOLAX) packet Take 17 g by mouth daily. 14 each 0  . selegiline (ELDEPRYL) 5 MG capsule Take 5 mg by mouth 2 (two) times daily.    . tamsulosin (FLOMAX) 0.4 MG CAPS capsule TAKE 1 CAPSULE DAILY 90 capsule 3  . acetaminophen (TYLENOL) 650 MG CR tablet Take two 650 mg Tabs twice daily for pain (Patient not taking: Reported on 09/09/2015) 60 tablet 0   No current facility-administered medications for this visit.    Objective: BP 140/70 mmHg  Pulse 75  Temp(Src) 97.4 F (36.3 C)  Wt 246 lb (111.585 kg) Gen: NAD, resting comfortably CV: RRR no murmurs rubs or gallops Lungs: CTAB no crackles, wheeze, rhonchi Abdomen: soft/nontender/nondistended/normal bowel sounds. No rebound or guarding.  Ext: 2+ edema Skin: warm, dry Neuro:  moves all extremities, leans to right when sitting  Assessment/Plan:  Spinal stenosis of  lumbar region S: Sciatica has now resolved with PT/OT. Less sharp pain in the back itself, more of a pressure in his back when up and moving.  Patient with Severe stenosis l4-l5. Pain control tylenol and lidocaine. Cannot take percocet due to constipation. At last visit patient told me back pain was 33% better, today states no better than that time abotu 4 weeks ago. We had  Considered pain management or neurosurgery referral. Dr. Carles Collet saw patient and suggested Dr. Vertell Limber. Has seen Dr. Carloyn Manner in the past- injections did not help at that time but patient willing to consider again.  A/P: Patient is now ready to pursue neurosurgical consultation for options- referral placed at this time.    Chronic diastolic heart failure S:  weight stable- no orthopnea or PND. Edema stable at 2+ A/P: we discussed trying to help mobilize some fluid with BID dosing short term but patient declines at this time. Diastolic CHF appears stable. Hesitant to add new BP medication but truly would rather have <140/90   3 months planned  Orders Placed This Encounter  Procedures  . Flu Vaccine QUAD 36+ mos IM  . Ambulatory referral to Neurosurgery    Referral Priority:  Routine    Referral Type:  Surgical    Referral Reason:  Specialty Services Required    Requested Specialty:  Neurosurgery    Number of Visits Requested:  1  Dr. Vertell Limber specifically requested

## 2015-09-09 NOTE — Patient Instructions (Addendum)
Flu shot received today.  We will call you within a week about your referral to neurosurgery with Dr. Vertell Limber. If you do not hear within 2 weeks, give [place] a call. I sent Dr. Carles Collet a message to let her know  3 months

## 2015-09-09 NOTE — Assessment & Plan Note (Addendum)
S: Sciatica has now resolved with PT/OT. Less sharp pain in the back itself, more of a pressure in his back when up and moving.  Patient with Severe stenosis l4-l5. Pain control tylenol and lidocaine. Cannot take percocet due to constipation. At last visit patient told me back pain was 33% better, today states no better than that time abotu 4 weeks ago. We had  Considered pain management or neurosurgery referral. Dr. Carles Collet saw patient and suggested Dr. Vertell Limber. Has seen Dr. Carloyn Manner in the past- injections did not help at that time but patient willing to consider again.  A/P: Patient is now ready to pursue neurosurgical consultation for options- referral placed at this time.

## 2015-09-10 DIAGNOSIS — M5106 Intervertebral disc disorders with myelopathy, lumbar region: Secondary | ICD-10-CM | POA: Diagnosis not present

## 2015-09-10 DIAGNOSIS — M5432 Sciatica, left side: Secondary | ICD-10-CM | POA: Diagnosis not present

## 2015-09-10 DIAGNOSIS — I5032 Chronic diastolic (congestive) heart failure: Secondary | ICD-10-CM | POA: Diagnosis not present

## 2015-09-10 DIAGNOSIS — G20 Parkinson's disease: Secondary | ICD-10-CM | POA: Diagnosis not present

## 2015-09-10 DIAGNOSIS — M4806 Spinal stenosis, lumbar region: Secondary | ICD-10-CM | POA: Diagnosis not present

## 2015-09-10 DIAGNOSIS — I10 Essential (primary) hypertension: Secondary | ICD-10-CM | POA: Diagnosis not present

## 2015-09-11 DIAGNOSIS — I10 Essential (primary) hypertension: Secondary | ICD-10-CM | POA: Diagnosis not present

## 2015-09-11 DIAGNOSIS — M4806 Spinal stenosis, lumbar region: Secondary | ICD-10-CM | POA: Diagnosis not present

## 2015-09-11 DIAGNOSIS — G20 Parkinson's disease: Secondary | ICD-10-CM | POA: Diagnosis not present

## 2015-09-11 DIAGNOSIS — M5106 Intervertebral disc disorders with myelopathy, lumbar region: Secondary | ICD-10-CM | POA: Diagnosis not present

## 2015-09-11 DIAGNOSIS — I5032 Chronic diastolic (congestive) heart failure: Secondary | ICD-10-CM | POA: Diagnosis not present

## 2015-09-11 DIAGNOSIS — M5432 Sciatica, left side: Secondary | ICD-10-CM | POA: Diagnosis not present

## 2015-09-14 DIAGNOSIS — M5106 Intervertebral disc disorders with myelopathy, lumbar region: Secondary | ICD-10-CM | POA: Diagnosis not present

## 2015-09-14 DIAGNOSIS — M4806 Spinal stenosis, lumbar region: Secondary | ICD-10-CM | POA: Diagnosis not present

## 2015-09-14 DIAGNOSIS — I10 Essential (primary) hypertension: Secondary | ICD-10-CM | POA: Diagnosis not present

## 2015-09-14 DIAGNOSIS — G20 Parkinson's disease: Secondary | ICD-10-CM | POA: Diagnosis not present

## 2015-09-14 DIAGNOSIS — I5032 Chronic diastolic (congestive) heart failure: Secondary | ICD-10-CM | POA: Diagnosis not present

## 2015-09-14 DIAGNOSIS — M5432 Sciatica, left side: Secondary | ICD-10-CM | POA: Diagnosis not present

## 2015-09-15 ENCOUNTER — Telehealth: Payer: Self-pay | Admitting: Family Medicine

## 2015-09-15 NOTE — Telephone Encounter (Signed)
Letter faxed.

## 2015-09-15 NOTE — Telephone Encounter (Signed)
On your desk

## 2015-09-15 NOTE — Telephone Encounter (Signed)
Pt needs referral to rehab center at [place]  for outpatient physical therapy for strengthening ,back pain and gait disturbance. Please fax order to [phone]. Pt has been discharge from [facility]

## 2015-09-15 NOTE — Telephone Encounter (Signed)
Write on an RX pad and i will fax order.

## 2015-09-16 DIAGNOSIS — I5032 Chronic diastolic (congestive) heart failure: Secondary | ICD-10-CM | POA: Diagnosis not present

## 2015-09-16 DIAGNOSIS — M5432 Sciatica, left side: Secondary | ICD-10-CM | POA: Diagnosis not present

## 2015-09-16 DIAGNOSIS — M4806 Spinal stenosis, lumbar region: Secondary | ICD-10-CM | POA: Diagnosis not present

## 2015-09-16 DIAGNOSIS — G20 Parkinson's disease: Secondary | ICD-10-CM | POA: Diagnosis not present

## 2015-09-16 DIAGNOSIS — I10 Essential (primary) hypertension: Secondary | ICD-10-CM | POA: Diagnosis not present

## 2015-09-16 DIAGNOSIS — M5106 Intervertebral disc disorders with myelopathy, lumbar region: Secondary | ICD-10-CM | POA: Diagnosis not present

## 2015-09-17 ENCOUNTER — Ambulatory Visit: Payer: Medicare Other

## 2015-09-17 DIAGNOSIS — G20 Parkinson's disease: Secondary | ICD-10-CM | POA: Diagnosis not present

## 2015-09-17 DIAGNOSIS — I10 Essential (primary) hypertension: Secondary | ICD-10-CM | POA: Diagnosis not present

## 2015-09-17 DIAGNOSIS — M4806 Spinal stenosis, lumbar region: Secondary | ICD-10-CM | POA: Diagnosis not present

## 2015-09-17 DIAGNOSIS — I5032 Chronic diastolic (congestive) heart failure: Secondary | ICD-10-CM | POA: Diagnosis not present

## 2015-09-17 DIAGNOSIS — M5106 Intervertebral disc disorders with myelopathy, lumbar region: Secondary | ICD-10-CM | POA: Diagnosis not present

## 2015-09-17 DIAGNOSIS — M5432 Sciatica, left side: Secondary | ICD-10-CM | POA: Diagnosis not present

## 2015-09-18 ENCOUNTER — Telehealth: Payer: Self-pay | Admitting: Family Medicine

## 2015-09-18 NOTE — Telephone Encounter (Signed)
Letter refaxed 

## 2015-09-18 NOTE — Telephone Encounter (Signed)
Wife called to say that [facility] does not have an order for her husband yet. Please refax to [phone].

## 2015-09-23 DIAGNOSIS — R293 Abnormal posture: Secondary | ICD-10-CM | POA: Diagnosis not present

## 2015-09-23 DIAGNOSIS — G20 Parkinson's disease: Secondary | ICD-10-CM | POA: Diagnosis not present

## 2015-09-23 DIAGNOSIS — R2689 Other abnormalities of gait and mobility: Secondary | ICD-10-CM | POA: Diagnosis not present

## 2015-09-23 DIAGNOSIS — Z7409 Other reduced mobility: Secondary | ICD-10-CM | POA: Diagnosis not present

## 2015-09-23 DIAGNOSIS — M549 Dorsalgia, unspecified: Secondary | ICD-10-CM | POA: Diagnosis not present

## 2015-09-24 ENCOUNTER — Ambulatory Visit (INDEPENDENT_AMBULATORY_CARE_PROVIDER_SITE_OTHER): Payer: Medicare Other | Admitting: Neurology

## 2015-09-24 DIAGNOSIS — E538 Deficiency of other specified B group vitamins: Secondary | ICD-10-CM

## 2015-09-24 MED ORDER — CYANOCOBALAMIN 1000 MCG/ML IJ SOLN
1000.0000 ug | Freq: Once | INTRAMUSCULAR | Status: AC
  Administered 2015-09-24: 1000 ug via INTRAMUSCULAR

## 2015-09-24 NOTE — Progress Notes (Signed)
B12 injection to left deltoid with no apparent complications.   

## 2015-09-25 DIAGNOSIS — G20 Parkinson's disease: Secondary | ICD-10-CM | POA: Diagnosis not present

## 2015-09-25 DIAGNOSIS — R293 Abnormal posture: Secondary | ICD-10-CM | POA: Diagnosis not present

## 2015-09-25 DIAGNOSIS — Z7409 Other reduced mobility: Secondary | ICD-10-CM | POA: Diagnosis not present

## 2015-09-25 DIAGNOSIS — M549 Dorsalgia, unspecified: Secondary | ICD-10-CM | POA: Diagnosis not present

## 2015-09-25 DIAGNOSIS — R2689 Other abnormalities of gait and mobility: Secondary | ICD-10-CM | POA: Diagnosis not present

## 2015-09-30 DIAGNOSIS — Z7409 Other reduced mobility: Secondary | ICD-10-CM | POA: Diagnosis not present

## 2015-09-30 DIAGNOSIS — G20 Parkinson's disease: Secondary | ICD-10-CM | POA: Diagnosis not present

## 2015-09-30 DIAGNOSIS — M549 Dorsalgia, unspecified: Secondary | ICD-10-CM | POA: Diagnosis not present

## 2015-09-30 DIAGNOSIS — R2689 Other abnormalities of gait and mobility: Secondary | ICD-10-CM | POA: Diagnosis not present

## 2015-09-30 DIAGNOSIS — R293 Abnormal posture: Secondary | ICD-10-CM | POA: Diagnosis not present

## 2015-10-02 DIAGNOSIS — M549 Dorsalgia, unspecified: Secondary | ICD-10-CM | POA: Diagnosis not present

## 2015-10-02 DIAGNOSIS — G20 Parkinson's disease: Secondary | ICD-10-CM | POA: Diagnosis not present

## 2015-10-02 DIAGNOSIS — R2689 Other abnormalities of gait and mobility: Secondary | ICD-10-CM | POA: Diagnosis not present

## 2015-10-02 DIAGNOSIS — Z7409 Other reduced mobility: Secondary | ICD-10-CM | POA: Diagnosis not present

## 2015-10-02 DIAGNOSIS — R293 Abnormal posture: Secondary | ICD-10-CM | POA: Diagnosis not present

## 2015-10-07 DIAGNOSIS — G20 Parkinson's disease: Secondary | ICD-10-CM | POA: Diagnosis not present

## 2015-10-07 DIAGNOSIS — M549 Dorsalgia, unspecified: Secondary | ICD-10-CM | POA: Diagnosis not present

## 2015-10-07 DIAGNOSIS — R293 Abnormal posture: Secondary | ICD-10-CM | POA: Diagnosis not present

## 2015-10-07 DIAGNOSIS — Z7409 Other reduced mobility: Secondary | ICD-10-CM | POA: Diagnosis not present

## 2015-10-07 DIAGNOSIS — R2689 Other abnormalities of gait and mobility: Secondary | ICD-10-CM | POA: Diagnosis not present

## 2015-10-09 DIAGNOSIS — Z7409 Other reduced mobility: Secondary | ICD-10-CM | POA: Diagnosis not present

## 2015-10-09 DIAGNOSIS — G20 Parkinson's disease: Secondary | ICD-10-CM | POA: Diagnosis not present

## 2015-10-09 DIAGNOSIS — R293 Abnormal posture: Secondary | ICD-10-CM | POA: Diagnosis not present

## 2015-10-09 DIAGNOSIS — R2689 Other abnormalities of gait and mobility: Secondary | ICD-10-CM | POA: Diagnosis not present

## 2015-10-09 DIAGNOSIS — M549 Dorsalgia, unspecified: Secondary | ICD-10-CM | POA: Diagnosis not present

## 2015-10-10 ENCOUNTER — Other Ambulatory Visit: Payer: Self-pay | Admitting: Neurology

## 2015-10-12 NOTE — Telephone Encounter (Signed)
Selegiline refill requested. Per last office note- patient to remain on medication. Refill approved and sent to patient's pharmacy.   

## 2015-10-14 DIAGNOSIS — Z7409 Other reduced mobility: Secondary | ICD-10-CM | POA: Diagnosis not present

## 2015-10-14 DIAGNOSIS — M549 Dorsalgia, unspecified: Secondary | ICD-10-CM | POA: Diagnosis not present

## 2015-10-14 DIAGNOSIS — R2689 Other abnormalities of gait and mobility: Secondary | ICD-10-CM | POA: Diagnosis not present

## 2015-10-14 DIAGNOSIS — G20 Parkinson's disease: Secondary | ICD-10-CM | POA: Diagnosis not present

## 2015-10-14 DIAGNOSIS — R293 Abnormal posture: Secondary | ICD-10-CM | POA: Diagnosis not present

## 2015-10-16 DIAGNOSIS — Z7409 Other reduced mobility: Secondary | ICD-10-CM | POA: Diagnosis not present

## 2015-10-16 DIAGNOSIS — R2689 Other abnormalities of gait and mobility: Secondary | ICD-10-CM | POA: Diagnosis not present

## 2015-10-16 DIAGNOSIS — M549 Dorsalgia, unspecified: Secondary | ICD-10-CM | POA: Diagnosis not present

## 2015-10-16 DIAGNOSIS — G20 Parkinson's disease: Secondary | ICD-10-CM | POA: Diagnosis not present

## 2015-10-16 DIAGNOSIS — R293 Abnormal posture: Secondary | ICD-10-CM | POA: Diagnosis not present

## 2015-10-21 DIAGNOSIS — M545 Low back pain: Secondary | ICD-10-CM | POA: Diagnosis not present

## 2015-10-21 DIAGNOSIS — Z7409 Other reduced mobility: Secondary | ICD-10-CM | POA: Diagnosis not present

## 2015-10-21 DIAGNOSIS — M4120 Other idiopathic scoliosis, site unspecified: Secondary | ICD-10-CM | POA: Diagnosis not present

## 2015-10-21 DIAGNOSIS — R293 Abnormal posture: Secondary | ICD-10-CM | POA: Diagnosis not present

## 2015-10-21 DIAGNOSIS — R2689 Other abnormalities of gait and mobility: Secondary | ICD-10-CM | POA: Diagnosis not present

## 2015-10-21 DIAGNOSIS — G20 Parkinson's disease: Secondary | ICD-10-CM | POA: Diagnosis not present

## 2015-10-21 DIAGNOSIS — M4806 Spinal stenosis, lumbar region: Secondary | ICD-10-CM | POA: Diagnosis not present

## 2015-10-21 DIAGNOSIS — M5416 Radiculopathy, lumbar region: Secondary | ICD-10-CM | POA: Diagnosis not present

## 2015-10-21 DIAGNOSIS — M549 Dorsalgia, unspecified: Secondary | ICD-10-CM | POA: Diagnosis not present

## 2015-10-22 ENCOUNTER — Ambulatory Visit: Payer: Medicare Other

## 2015-10-22 ENCOUNTER — Ambulatory Visit: Payer: Medicare Other | Admitting: Occupational Therapy

## 2015-10-22 ENCOUNTER — Ambulatory Visit: Payer: Medicare Other | Admitting: Physical Therapy

## 2015-10-22 DIAGNOSIS — M4120 Other idiopathic scoliosis, site unspecified: Secondary | ICD-10-CM | POA: Diagnosis not present

## 2015-10-22 DIAGNOSIS — M4806 Spinal stenosis, lumbar region: Secondary | ICD-10-CM | POA: Diagnosis not present

## 2015-10-22 DIAGNOSIS — M5416 Radiculopathy, lumbar region: Secondary | ICD-10-CM | POA: Diagnosis not present

## 2015-10-23 DIAGNOSIS — G20 Parkinson's disease: Secondary | ICD-10-CM | POA: Diagnosis not present

## 2015-10-23 DIAGNOSIS — M549 Dorsalgia, unspecified: Secondary | ICD-10-CM | POA: Diagnosis not present

## 2015-10-23 DIAGNOSIS — R2689 Other abnormalities of gait and mobility: Secondary | ICD-10-CM | POA: Diagnosis not present

## 2015-10-28 ENCOUNTER — Ambulatory Visit (INDEPENDENT_AMBULATORY_CARE_PROVIDER_SITE_OTHER): Payer: Medicare Other | Admitting: Neurology

## 2015-10-28 DIAGNOSIS — R2689 Other abnormalities of gait and mobility: Secondary | ICD-10-CM | POA: Diagnosis not present

## 2015-10-28 DIAGNOSIS — E538 Deficiency of other specified B group vitamins: Secondary | ICD-10-CM

## 2015-10-28 DIAGNOSIS — G20 Parkinson's disease: Secondary | ICD-10-CM | POA: Diagnosis not present

## 2015-10-28 DIAGNOSIS — M549 Dorsalgia, unspecified: Secondary | ICD-10-CM | POA: Diagnosis not present

## 2015-10-28 MED ORDER — CYANOCOBALAMIN 1000 MCG/ML IJ SOLN
1000.0000 ug | Freq: Once | INTRAMUSCULAR | Status: AC
  Administered 2015-10-28: 1000 ug via INTRAMUSCULAR

## 2015-10-28 NOTE — Progress Notes (Signed)
B12 injection to right deltoid with no apparent complications.   

## 2015-10-30 DIAGNOSIS — M549 Dorsalgia, unspecified: Secondary | ICD-10-CM | POA: Diagnosis not present

## 2015-10-30 DIAGNOSIS — R2689 Other abnormalities of gait and mobility: Secondary | ICD-10-CM | POA: Diagnosis not present

## 2015-10-30 DIAGNOSIS — G20 Parkinson's disease: Secondary | ICD-10-CM | POA: Diagnosis not present

## 2015-11-02 DIAGNOSIS — M549 Dorsalgia, unspecified: Secondary | ICD-10-CM | POA: Diagnosis not present

## 2015-11-02 DIAGNOSIS — R2689 Other abnormalities of gait and mobility: Secondary | ICD-10-CM | POA: Diagnosis not present

## 2015-11-02 DIAGNOSIS — G20 Parkinson's disease: Secondary | ICD-10-CM | POA: Diagnosis not present

## 2015-11-04 DIAGNOSIS — M549 Dorsalgia, unspecified: Secondary | ICD-10-CM | POA: Diagnosis not present

## 2015-11-04 DIAGNOSIS — G20 Parkinson's disease: Secondary | ICD-10-CM | POA: Diagnosis not present

## 2015-11-04 DIAGNOSIS — R2689 Other abnormalities of gait and mobility: Secondary | ICD-10-CM | POA: Diagnosis not present

## 2015-11-11 DIAGNOSIS — R2689 Other abnormalities of gait and mobility: Secondary | ICD-10-CM | POA: Diagnosis not present

## 2015-11-11 DIAGNOSIS — M549 Dorsalgia, unspecified: Secondary | ICD-10-CM | POA: Diagnosis not present

## 2015-11-11 DIAGNOSIS — G20 Parkinson's disease: Secondary | ICD-10-CM | POA: Diagnosis not present

## 2015-11-12 DIAGNOSIS — M5416 Radiculopathy, lumbar region: Secondary | ICD-10-CM | POA: Diagnosis not present

## 2015-11-12 DIAGNOSIS — M4120 Other idiopathic scoliosis, site unspecified: Secondary | ICD-10-CM | POA: Diagnosis not present

## 2015-11-13 DIAGNOSIS — M549 Dorsalgia, unspecified: Secondary | ICD-10-CM | POA: Diagnosis not present

## 2015-11-13 DIAGNOSIS — R2689 Other abnormalities of gait and mobility: Secondary | ICD-10-CM | POA: Diagnosis not present

## 2015-11-13 DIAGNOSIS — G20 Parkinson's disease: Secondary | ICD-10-CM | POA: Diagnosis not present

## 2015-11-25 ENCOUNTER — Ambulatory Visit (INDEPENDENT_AMBULATORY_CARE_PROVIDER_SITE_OTHER): Payer: Medicare Other | Admitting: Neurology

## 2015-11-25 DIAGNOSIS — R2689 Other abnormalities of gait and mobility: Secondary | ICD-10-CM | POA: Diagnosis not present

## 2015-11-25 DIAGNOSIS — M549 Dorsalgia, unspecified: Secondary | ICD-10-CM | POA: Diagnosis not present

## 2015-11-25 DIAGNOSIS — E538 Deficiency of other specified B group vitamins: Secondary | ICD-10-CM | POA: Diagnosis not present

## 2015-11-25 DIAGNOSIS — G20 Parkinson's disease: Secondary | ICD-10-CM | POA: Diagnosis not present

## 2015-11-25 MED ORDER — CYANOCOBALAMIN 1000 MCG/ML IJ SOLN
1000.0000 ug | Freq: Once | INTRAMUSCULAR | Status: AC
  Administered 2015-11-25: 1000 ug via INTRAMUSCULAR

## 2015-11-25 NOTE — Progress Notes (Signed)
B12 injection to left deltoid with no apparent complications.   

## 2015-12-15 DIAGNOSIS — M545 Low back pain: Secondary | ICD-10-CM | POA: Diagnosis not present

## 2015-12-15 DIAGNOSIS — M4120 Other idiopathic scoliosis, site unspecified: Secondary | ICD-10-CM | POA: Diagnosis not present

## 2015-12-15 DIAGNOSIS — M5416 Radiculopathy, lumbar region: Secondary | ICD-10-CM | POA: Diagnosis not present

## 2015-12-15 DIAGNOSIS — M4806 Spinal stenosis, lumbar region: Secondary | ICD-10-CM | POA: Diagnosis not present

## 2015-12-15 DIAGNOSIS — G20 Parkinson's disease: Secondary | ICD-10-CM | POA: Diagnosis not present

## 2015-12-16 ENCOUNTER — Encounter: Payer: Self-pay | Admitting: Family Medicine

## 2015-12-16 ENCOUNTER — Ambulatory Visit (INDEPENDENT_AMBULATORY_CARE_PROVIDER_SITE_OTHER): Payer: Medicare Other | Admitting: Family Medicine

## 2015-12-16 VITALS — BP 122/70 | HR 75 | Temp 97.6°F | Wt 249.0 lb

## 2015-12-16 DIAGNOSIS — I5032 Chronic diastolic (congestive) heart failure: Secondary | ICD-10-CM | POA: Diagnosis not present

## 2015-12-16 DIAGNOSIS — R2689 Other abnormalities of gait and mobility: Secondary | ICD-10-CM | POA: Diagnosis not present

## 2015-12-16 DIAGNOSIS — M4806 Spinal stenosis, lumbar region: Secondary | ICD-10-CM | POA: Diagnosis not present

## 2015-12-16 DIAGNOSIS — Z23 Encounter for immunization: Secondary | ICD-10-CM | POA: Diagnosis not present

## 2015-12-16 DIAGNOSIS — M48061 Spinal stenosis, lumbar region without neurogenic claudication: Secondary | ICD-10-CM

## 2015-12-16 DIAGNOSIS — M549 Dorsalgia, unspecified: Secondary | ICD-10-CM | POA: Diagnosis not present

## 2015-12-16 DIAGNOSIS — G20 Parkinson's disease: Secondary | ICD-10-CM | POA: Diagnosis not present

## 2015-12-16 NOTE — Assessment & Plan Note (Signed)
S: Has seen Dr. Vertell Limber and repeat trial of epidural injections has been completed x 2. Patient with mild improvement but is making day more enjoyable and helping with mobility. Waiting another 3 months for next round A/P: hopeful patient will have continued improvement over 3 month period. We may want to consider an a1c in future if continues chronic steroid steroid injections.

## 2015-12-16 NOTE — Patient Instructions (Addendum)
Final pneumonia([id number]) received today.  No changes today  Glad the back is a little better  Keep an eye on the swelling- want current level to be about your worst/highest level. Watch the salt in the diet.

## 2015-12-16 NOTE — Assessment & Plan Note (Signed)
S: weight up 3 lbs and subjectively swelling slightly worse. Did have a lot of salt indulgence over holidays and that seemed to trigger weight and swelling increase. Compliant with lasix 40mg  BID A/P: advised patient to cut down on salt. Continue lasix 40mg  BID. Follow up 3 months unless swelling or weight increase sooner- see me sooner

## 2015-12-16 NOTE — Progress Notes (Signed)
[name], MD  Subjective:  Walter Gill is a 82 y.o. year old very pleasant male patient who presents for/with See problem oriented charting ROS- No chest pain or shortness of breath. Chronic edema mildly worse. Mild nasal congestion but nothing like wife's.    Past Medical History-  Patient Active Problem List   Diagnosis Date Noted  . Spinal stenosis of lumbar region 08/01/2015    Priority: High  . Chronic diastolic heart failure ([place]) 10/29/2014    Priority: High  . Chronic pain syndrome 10/01/2014    Priority: High  . PARKINSON'S DISEASE 07/02/2007    Priority: High  . Urinary incontinence 01/14/2015    Priority: Medium  . Hyperlipidemia 10/29/2014    Priority: Medium  . Constipation 10/24/2014    Priority: Medium  . OSA on CPAP 04/17/2013    Priority: Medium  . DVT, HX OF 10/16/2007    Priority: Medium  . PULMONARY EMBOLISM 10/08/2007    Priority: Medium  . Gout 07/02/2007    Priority: Medium  . B12 deficiency 06/06/2013    Priority: Low  . REM behavioral disorder 10/25/2012    Priority: Low  . RBBB 07/02/2007    Priority: Low  . Osteoarthritis 07/02/2007    Priority: Low    Medications- reviewed and updated Current Outpatient Prescriptions  Medication Sig Dispense Refill  . acetaminophen (TYLENOL) 650 MG CR tablet Take two 650 mg Tabs twice daily for pain (Patient not taking: Reported on 09/09/2015) 60 tablet 0  . allopurinol (ZYLOPRIM) 100 MG tablet Take 1 tablet (100 mg total) by mouth daily. 30 tablet 5  . aspirin 81 MG chewable tablet Chew 81 mg by mouth at bedtime.     . bisacodyl (DULCOLAX) 5 MG EC tablet Take 2 tablets (10 mg total) by mouth every morning. 30 tablet 0  . carbidopa-levodopa (SINEMET CR) 50-200 MG per tablet TAKE 1 TABLET 5 TIMES DAILY 450 tablet 3  . CRESTOR 10 MG tablet TAKE 1 TABLET DAILY (Patient taking differently: TAKE 1 TABLET BY MOUTH DAILY) 90 tablet 1  . furosemide (LASIX) 40 MG tablet Take 1 tablet (40 mg total) by  mouth 2 (two) times daily. 180 tablet 1  . KLOR-CON M20 20 MEQ tablet TAKE 1 TABLET TWICE A DAY (Patient taking differently: TAKE 1 TABLET BY MOUTH TWICE A DAY) 180 tablet 3  . lidocaine (LIDODERM) 5 % Apply to lower back 12 hours on and 12 hours off. 30 patch 2  . Magnesium Oxide 500 MG TABS Take 500 mg by mouth 2 (two) times daily.    . Mirabegron ER (MYRBETRIQ) 25 MG TB24 Take 25 mg by mouth at bedtime.     . nitrofurantoin, macrocrystal-monohydrate, (MACROBID) 100 MG capsule Take 100 mg by mouth at bedtime.     . polyethylene glycol (MIRALAX / GLYCOLAX) packet Take 17 g by mouth daily. 14 each 0  . selegiline (ELDEPRYL) 5 MG capsule Take 5 mg by mouth 2 (two) times daily.    . selegiline (ELDEPRYL) 5 MG capsule TAKE 1 CAPSULE TWICE DAILY BEFORE MEALS 180 capsule 3  . tamsulosin (FLOMAX) 0.4 MG CAPS capsule TAKE 1 CAPSULE DAILY 90 capsule 3   No current facility-administered medications for this visit.    Objective: BP 122/70 mmHg  Pulse 75  Temp(Src) 97.6 F (36.4 C)  Wt 249 lb (112.946 kg) Gen: NAD, resting comfortably, leans to his right CV: RRR no murmurs rubs or gallops Lungs: CTAB no crackles, wheeze, rhonchi Abdomen: soft/nontender/nondistended/normal bowel  sounds. Ext: 2+ edema Skin: warm, dry Neuro: grossly normal, moves all extremities, walks with walker  Assessment/Plan:  Chronic diastolic heart failure S: weight up 3 lbs and subjectively swelling slightly worse. Did have a lot of salt indulgence over holidays and that seemed to trigger weight and swelling increase. Compliant with lasix 40mg  BID A/P: advised patient to cut down on salt. Continue lasix 40mg  BID. Follow up 3 months unless swelling or weight increase sooner- see me sooner   Spinal stenosis of lumbar region S: Has seen Dr. Vertell Limber and repeat trial of epidural injections has been completed x 2. Patient with mild improvement but is making day more enjoyable and helping with mobility. Waiting another 3 months  for next round A/P: hopeful patient will have continued improvement over 3 month period. We may want to consider an a1c in future if continues chronic steroid steroid injections.    also no gout flares and constipation has been well managed  3 months Return precautions advised.   Orders Placed This Encounter  Procedures  . Pneumococcal conjugate vaccine 13-valent

## 2015-12-22 ENCOUNTER — Other Ambulatory Visit: Payer: Self-pay | Admitting: Family Medicine

## 2015-12-24 ENCOUNTER — Ambulatory Visit (INDEPENDENT_AMBULATORY_CARE_PROVIDER_SITE_OTHER): Payer: Medicare Other | Admitting: Neurology

## 2015-12-24 ENCOUNTER — Encounter: Payer: Self-pay | Admitting: Neurology

## 2015-12-24 VITALS — BP 124/72 | HR 88 | Ht 68.0 in | Wt 247.0 lb

## 2015-12-24 DIAGNOSIS — K117 Disturbances of salivary secretion: Secondary | ICD-10-CM

## 2015-12-24 DIAGNOSIS — G4752 REM sleep behavior disorder: Secondary | ICD-10-CM

## 2015-12-24 DIAGNOSIS — G20 Parkinson's disease: Secondary | ICD-10-CM | POA: Diagnosis not present

## 2015-12-24 DIAGNOSIS — E538 Deficiency of other specified B group vitamins: Secondary | ICD-10-CM | POA: Diagnosis not present

## 2015-12-24 DIAGNOSIS — G8929 Other chronic pain: Secondary | ICD-10-CM

## 2015-12-24 DIAGNOSIS — M545 Low back pain: Secondary | ICD-10-CM | POA: Diagnosis not present

## 2015-12-24 MED ORDER — CYANOCOBALAMIN 1000 MCG/ML IJ SOLN
1000.0000 ug | Freq: Once | INTRAMUSCULAR | Status: AC
  Administered 2015-12-24: 1000 ug via INTRAMUSCULAR
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
## 2015-12-25 DIAGNOSIS — N3281 Overactive bladder: Secondary | ICD-10-CM | POA: Diagnosis not present

## 2015-12-25 DIAGNOSIS — Z0000 Encounter for general adult medical examination without abnormal findings: Secondary | ICD-10-CM | POA: Diagnosis not present

## 2015-12-25 DIAGNOSIS — N401 Enlarged prostate with lower urinary tract symptoms: Secondary | ICD-10-CM | POA: Diagnosis not present

## 2015-12-25 DIAGNOSIS — R350 Frequency of micturition: Secondary | ICD-10-CM | POA: Diagnosis not present

## 2015-12-30 ENCOUNTER — Telehealth: Payer: Self-pay

## 2015-12-30 DIAGNOSIS — M549 Dorsalgia, unspecified: Secondary | ICD-10-CM | POA: Diagnosis not present

## 2015-12-30 DIAGNOSIS — R2689 Other abnormalities of gait and mobility: Secondary | ICD-10-CM | POA: Diagnosis not present

## 2015-12-30 DIAGNOSIS — G20 Parkinson's disease: Secondary | ICD-10-CM | POA: Diagnosis not present

## 2015-12-30 NOTE — Telephone Encounter (Signed)
Error

## 2016-01-16 ENCOUNTER — Other Ambulatory Visit: Payer: Self-pay | Admitting: Family Medicine

## 2016-01-25 ENCOUNTER — Ambulatory Visit (INDEPENDENT_AMBULATORY_CARE_PROVIDER_SITE_OTHER): Payer: Medicare Other | Admitting: Neurology

## 2016-01-25 DIAGNOSIS — E538 Deficiency of other specified B group vitamins: Secondary | ICD-10-CM | POA: Diagnosis not present

## 2016-01-25 MED ORDER — CYANOCOBALAMIN 1000 MCG/ML IJ SOLN
1000.0000 ug | Freq: Once | INTRAMUSCULAR | Status: AC
  Administered 2016-01-25: 1000 ug via INTRAMUSCULAR

## 2016-01-25 NOTE — Progress Notes (Signed)
B12 injection to right deltoid with no apparent complications.   

## 2016-02-11 DIAGNOSIS — M545 Low back pain: Secondary | ICD-10-CM | POA: Diagnosis not present

## 2016-02-11 DIAGNOSIS — G20 Parkinson's disease: Secondary | ICD-10-CM | POA: Diagnosis not present

## 2016-02-11 DIAGNOSIS — M4806 Spinal stenosis, lumbar region: Secondary | ICD-10-CM | POA: Diagnosis not present

## 2016-02-24 ENCOUNTER — Ambulatory Visit: Payer: Medicare Other

## 2016-03-09 DIAGNOSIS — M4806 Spinal stenosis, lumbar region: Secondary | ICD-10-CM | POA: Diagnosis not present

## 2016-03-09 DIAGNOSIS — M545 Low back pain: Secondary | ICD-10-CM | POA: Diagnosis not present

## 2016-03-09 DIAGNOSIS — Z6838 Body mass index (BMI) 38.0-38.9, adult: Secondary | ICD-10-CM | POA: Diagnosis not present

## 2016-03-09 DIAGNOSIS — I10 Essential (primary) hypertension: Secondary | ICD-10-CM | POA: Diagnosis not present

## 2016-03-14 ENCOUNTER — Other Ambulatory Visit: Payer: Self-pay | Admitting: Family Medicine

## 2016-03-17 ENCOUNTER — Ambulatory Visit (INDEPENDENT_AMBULATORY_CARE_PROVIDER_SITE_OTHER): Payer: Medicare Other | Admitting: Family Medicine

## 2016-03-17 ENCOUNTER — Encounter: Payer: Self-pay | Admitting: Family Medicine

## 2016-03-17 VITALS — BP 120/64 | HR 80 | Temp 98.1°F | Wt 251.0 lb

## 2016-03-17 DIAGNOSIS — I5032 Chronic diastolic (congestive) heart failure: Secondary | ICD-10-CM

## 2016-03-17 DIAGNOSIS — M1A9XX0 Chronic gout, unspecified, without tophus (tophi): Secondary | ICD-10-CM

## 2016-03-17 DIAGNOSIS — E785 Hyperlipidemia, unspecified: Secondary | ICD-10-CM

## 2016-03-17 DIAGNOSIS — R739 Hyperglycemia, unspecified: Secondary | ICD-10-CM

## 2016-03-17 LAB — COMPREHENSIVE METABOLIC PANEL WITH GFR
ALT: 5 U/L (ref 0–53)
AST: 15 U/L (ref 0–37)
Albumin: 4.3 g/dL (ref 3.5–5.2)
Alkaline Phosphatase: 86 U/L (ref 39–117)
BUN: 27 mg/dL — ABNORMAL HIGH (ref 6–23)
CO2: 31 meq/L (ref 19–32)
Calcium: 9.8 mg/dL (ref 8.4–10.5)
Chloride: 101 meq/L (ref 96–112)
Creatinine, Ser: 0.93 mg/dL (ref 0.40–1.50)
GFR: 83.04 mL/min
Glucose, Bld: 108 mg/dL — ABNORMAL HIGH (ref 70–99)
Potassium: 4 meq/L (ref 3.5–5.1)
Sodium: 141 meq/L (ref 135–145)
Total Bilirubin: 0.7 mg/dL (ref 0.2–1.2)
Total Protein: 7.1 g/dL (ref 6.0–8.3)

## 2016-03-17 LAB — CBC
HCT: 41.4 % (ref 39.0–52.0)
Hemoglobin: 13.8 g/dL (ref 13.0–17.0)
MCHC: 33.3 g/dL (ref 30.0–36.0)
MCV: 85.8 fl (ref 78.0–100.0)
Platelets: 179 K/uL (ref 150.0–400.0)
RBC: 4.82 Mil/uL (ref 4.22–5.81)
RDW: 14.1 % (ref 11.5–15.5)
WBC: 4.9 K/uL (ref 4.0–10.5)

## 2016-03-17 LAB — HEMOGLOBIN A1C: Hgb A1c MFr Bld: 6.2 % (ref 4.6–6.5)

## 2016-03-17 LAB — LDL CHOLESTEROL, DIRECT: LDL DIRECT: 41 mg/dL

## 2016-03-17 MED ORDER — FUROSEMIDE 40 MG PO TABS: ORAL_TABLET | ORAL | Status: DC

## 2016-03-17 NOTE — Assessment & Plan Note (Addendum)
S: well controlled on crestor 10mg  in past. No myalgias.  Lab Results  Component Value Date   CHOL 96 10/04/2013   HDL 36.70* 10/04/2013   LDLCALC 27 10/04/2013   LDLDIRECT 101.2 06/04/2008   TRIG 160.0* 10/04/2013   CHOLHDL 3 10/04/2013   A/P: update direct LDL today with goal less than 70

## 2016-03-17 NOTE — Patient Instructions (Addendum)
Labs before you go  Try to get at least 3 servings of veggies a day and 3 servings of fruits. Eating in will help lower salt content compared to eating out. Bagged veggies like steam fresh can be very convenient  If you develop shortness of breath or worsening swelling return to see me sooner.   Try to be as active as you are able but when resting- elevate the legs  Low-Sodium Eating Plan Sodium raises blood pressure and causes water to be held in the body. Getting less sodium from food will help lower your blood pressure, reduce any swelling, and protect your heart, liver, and kidneys. We get sodium by adding salt (sodium chloride) to food. Most of our sodium comes from canned, boxed, and frozen foods. Restaurant foods, fast foods, and pizza are also very high in sodium. Even if you take medicine to lower your blood pressure or to reduce fluid in your body, getting less sodium from your food is important. WHAT IS MY PLAN? Most people should limit their sodium intake to 2,300 mg a day. Your health care provider recommends that you limit your sodium intake to 2000 mg  a day, below 1500mg  is even better WHAT DO I NEED TO KNOW ABOUT THIS EATING PLAN? For the low-sodium eating plan, you will follow these general guidelines:  Choose foods with a % Daily Value for sodium of less than 5% (as listed on the food label).   Use salt-free seasonings or herbs instead of table salt or sea salt.   Check with your health care provider or pharmacist before using salt substitutes.   Eat fresh foods.  Eat more vegetables and fruits.  Limit canned vegetables. If you do use them, rinse them well to decrease the sodium.   Limit cheese to 1 oz (28 g) per day.   Eat lower-sodium products, often labeled as "lower sodium" or "no salt added."  Avoid foods that contain monosodium glutamate (MSG). MSG is sometimes added to [place] food and some canned foods.  Check food labels (Nutrition Facts labels) on  foods to learn how much sodium is in one serving.  Eat more home-cooked food and less restaurant, buffet, and fast food.  When eating at a restaurant, ask that your food be prepared with less salt, or no salt if possible.  HOW DO I READ FOOD LABELS FOR SODIUM INFORMATION? The Nutrition Facts label lists the amount of sodium in one serving of the food. If you eat more than one serving, you must multiply the listed amount of sodium by the number of servings. Food labels may also identify foods as:  Sodium free--Less than 5 mg in a serving.  Very low sodium--35 mg or less in a serving.  Low sodium--140 mg or less in a serving.  Light in sodium--50% less sodium in a serving. For example, if a food that usually has 300 mg of sodium is changed to become light in sodium, it will have 150 mg of sodium.  Reduced sodium--25% less sodium in a serving. For example, if a food that usually has 400 mg of sodium is changed to reduced sodium, it will have 300 mg of sodium. WHAT FOODS CAN I EAT?  Vegetables Frozen or fresh vegetables. Low-sodium or reduced-sodium canned vegetables. Low-sodium or reduced-sodium tomato sauce and paste. Low-sodium or reduced-sodium tomato and vegetable juices.  Fruits Fresh, frozen, and canned fruit. Fruit juice.  Meat and Other Protein Products Low-sodium canned tuna and salmon. Fresh or frozen meat, poultry,  seafood, and fish. Lamb. Unsalted nuts. Dried beans, peas, and lentils without added salt. Unsalted canned beans. Homemade soups without salt. Eggs.  Dairy Milk. Soy milk. Ricotta cheese. Low-sodium or reduced-sodium cheeses. Yogurt.  Condiments Fresh and dried herbs and spices. Salt-free seasonings. Onion and garlic powders. Low-sodium varieties of mustard and ketchup. Fresh or refrigerated horseradish. Lemon juice.  Fats and Oils Reduced-sodium salad dressings. Unsalted butter.  Other Unsalted popcorn and pretzels.  The items listed above may not be a  complete list of recommended foods or beverages. Contact your dietitian for more options. Grains Low-sodium cereals, including oats, puffed wheat and rice, and shredded wheat cereals. Low-sodium crackers. Unsalted rice and pasta. Lower-sodium bread.  WHAT FOODS ARE NOT RECOMMENDED? Grains Instant hot cereals. Bread stuffing, pancake, and biscuit mixes. Croutons. Seasoned rice or pasta mixes. Noodle soup cups. Boxed or frozen macaroni and cheese. Self-rising flour. Regular salted crackers. Vegetables Regular canned vegetables. Regular canned tomato sauce and paste. Regular tomato and vegetable juices. Frozen vegetables in sauces. Salted [place] fries. Olives. Angie Fava. Relishes. Sauerkraut. Salsa. Meat and Other Protein Products Salted, canned, smoked, spiced, or pickled meats, seafood, or fish. Bacon, ham, sausage, hot dogs, corned beef, chipped beef, and packaged luncheon meats. Salt pork. Jerky. Pickled herring. Anchovies, regular canned tuna, and sardines. Salted nuts. Dairy Processed cheese and cheese spreads. Cheese curds. Blue cheese and cottage cheese. Buttermilk.  Condiments Onion and garlic salt, seasoned salt, table salt, and sea salt. Canned and packaged gravies. Worcestershire sauce. Tartar sauce. Barbecue sauce. Teriyaki sauce. Soy sauce, including reduced sodium. Steak sauce. Fish sauce. Oyster sauce. Cocktail sauce. Horseradish that you find on the shelf. Regular ketchup and mustard. Meat flavorings and tenderizers. Bouillon cubes. Hot sauce. Tabasco sauce. Marinades. Taco seasonings. Relishes. Fats and Oils Regular salad dressings. Salted butter. Margarine. Ghee. Bacon fat.  Other Potato and tortilla chips. Corn chips and puffs. Salted popcorn and pretzels. Canned or dried soups. Pizza. Frozen entrees and pot pies.  The items listed above may not be a complete list of foods and beverages to avoid. Contact your dietitian for more information.   This information is not  intended to replace advice given to you by your health care provider. Make sure you discuss any questions you have with your health care provider.   Document Released: 05/20/2002 Document Revised: 12/19/2014 Document Reviewed: 10/02/2013 Elsevier Interactive Patient Education [employer].

## 2016-03-17 NOTE — Progress Notes (Signed)
[name], MD  Subjective:  Walter Gill is a 82 y.o. year old very pleasant male patient who presents for/with See problem oriented charting ROS- no fever, chills, shortness of breath. No nausea, or vomiting. No recent hot swollen joints.   Past Medical History-  Patient Active Problem List   Diagnosis Date Noted  . Spinal stenosis of lumbar region 08/01/2015    Priority: High  . Chronic diastolic heart failure ([place]) 10/29/2014    Priority: High  . Chronic pain syndrome 10/01/2014    Priority: High  . PARKINSON'S DISEASE 07/02/2007    Priority: High  . Hyperglycemia 03/17/2016    Priority: Medium  . Hyperlipidemia 10/29/2014    Priority: Medium  . Constipation 10/24/2014    Priority: Medium  . OSA on CPAP 04/17/2013    Priority: Medium  . DVT, HX OF 10/16/2007    Priority: Medium  . Gout 07/02/2007    Priority: Medium  . Urinary incontinence 01/14/2015    Priority: Low  . B12 deficiency 06/06/2013    Priority: Low  . REM behavioral disorder 10/25/2012    Priority: Low  . PULMONARY EMBOLISM 10/08/2007    Priority: Low  . RBBB 07/02/2007    Priority: Low  . Osteoarthritis 07/02/2007    Priority: Low    Medications- reviewed and updated Current Outpatient Prescriptions  Medication Sig Dispense Refill  . allopurinol (ZYLOPRIM) 100 MG tablet Take 1 tablet (100 mg total) by mouth daily. 30 tablet 5  . aspirin 81 MG chewable tablet Chew 81 mg by mouth at bedtime.     . bisacodyl (DULCOLAX) 5 MG EC tablet Take 2 tablets (10 mg total) by mouth every morning. 30 tablet 0  . carbidopa-levodopa (SINEMET CR) 50-200 MG per tablet TAKE 1 TABLET 5 TIMES DAILY 450 tablet 3  . cholecalciferol (VITAMIN D) 1000 units tablet Take 1,000 Units by mouth daily.    . furosemide (LASIX) 40 MG tablet TAKE 1 TABLET (40 MG TOTAL) BY MOUTH 2 (TWO) TIMES DAILY. 180 tablet 3  . KLOR-CON M20 20 MEQ tablet TAKE 1 TABLET TWICE A DAY (Patient taking differently: TAKE 1 TABLET BY MOUTH TWICE A  DAY) 180 tablet 3  . Magnesium Oxide 500 MG TABS Take 500 mg by mouth 2 (two) times daily.    . Mirabegron ER (MYRBETRIQ) 25 MG TB24 Take 25 mg by mouth at bedtime.     . nitrofurantoin, macrocrystal-monohydrate, (MACROBID) 100 MG capsule Take 100 mg by mouth at bedtime.     . polyethylene glycol (MIRALAX / GLYCOLAX) packet Take 17 g by mouth daily. 14 each 0  . rosuvastatin (CRESTOR) 10 MG tablet TAKE 1 TABLET DAILY 90 tablet 3  . selegiline (ELDEPRYL) 5 MG capsule Take 5 mg by mouth 2 (two) times daily.    . tamsulosin (FLOMAX) 0.4 MG CAPS capsule TAKE 1 CAPSULE DAILY 90 capsule 3  . acetaminophen (TYLENOL) 650 MG CR tablet Take two 650 mg Tabs twice daily for pain (Patient not taking: Reported on 03/17/2016) 60 tablet 0   No current facility-administered medications for this visit.    Objective: BP 120/64 mmHg  Pulse 80  Temp(Src) 98.1 F (36.7 C)  Wt 251 lb (113.853 kg) Gen: NAD, resting comfortably, leans to his right CV: RRR no murmurs rubs or gallops Lungs: CTAB no crackles, wheeze, rhonchi Abdomen: soft/nontender/nondistended/normal bowel sounds. Ext: 2+ edema stable Skin: warm, dry Neuro: grossly normal, moves all extremities, walks with walker   Assessment/Plan:  Chronic diastolic heart  failure S: Patient's weight up 2 lbs. Swelling stable to slightly worse. No shortness of breath. Compliant with lasix 40mg  BID. Trying to do some better with salt with no salt pretzels. Over last week has eaten out 4x though A/P: Do not want to increase medication load. We will try to tighten salt. Advised 2000mg  max bu tprefer under 1500mg . Elevate legs as able. If shortness of breath, worsening weight gain or swelling return to care.    Hyperlipidemia S: well controlled on crestor 10mg  in past. No myalgias.  Lab Results  Component Value Date   CHOL 96 10/04/2013   HDL 36.70* 10/04/2013   LDLCALC 27 10/04/2013   LDLDIRECT 101.2 06/04/2008   TRIG 160.0* 10/04/2013   CHOLHDL 3  10/04/2013   A/P: update direct LDL today with goal less than 70  Gout S: compliant with Allopurinol 100mg . No flares.  A/P: update uric acid today- as long as 7 or less unlikely   Hyperglycemia S: Patient is limited in mobility so sometimes foods are what he enjoys most. He does not do well cooking due to his pain issues so doesn't cook much so falls on wife who has severe arthritis. As result- eats out reasonable amount. Sugars have been in at risk range A/P: check a1c. Tried to push for 3 veggies and 3 fruit servings per day.    Return in about 3 months (around 06/16/2016) for follow up- or sooner if needed. Return precautions advised.   Orders Placed This Encounter  Procedures  . CBC    [place]  . Comprehensive metabolic panel    [place]  . Hemoglobin A1c    [place]  . LDL cholesterol, direct    [place]    Meds ordered this encounter  Medications  . furosemide (LASIX) 40 MG tablet    Sig: TAKE 1 TABLET (40 MG TOTAL) BY MOUTH 2 (TWO) TIMES DAILY.    Dispense:  180 tablet    Refill:  3

## 2016-03-17 NOTE — Assessment & Plan Note (Signed)
S: Patient's weight up 2 lbs. Swelling stable to slightly worse. No shortness of breath. Compliant with lasix 40mg  BID. Trying to do some better with salt with no salt pretzels. Over last week has eaten out 4x though A/P: Do not want to increase medication load. We will try to tighten salt. Advised 2000mg  max bu tprefer under 1500mg . Elevate legs as able. If shortness of breath, worsening weight gain or swelling return to care.

## 2016-03-17 NOTE — Assessment & Plan Note (Signed)
S: Patient is limited in mobility so sometimes foods are what he enjoys most. He does not do well cooking due to his pain issues so doesn't cook much so falls on wife who has severe arthritis. As result- eats out reasonable amount. Sugars have been in at risk range A/P: check a1c. Tried to push for 3 veggies and 3 fruit servings per day.

## 2016-03-17 NOTE — Assessment & Plan Note (Signed)
S: compliant with Allopurinol 100mg . No flares.  A/P: update uric acid today- as long as 7 or less unlikely

## 2016-04-15 ENCOUNTER — Other Ambulatory Visit: Payer: Self-pay | Admitting: Family Medicine

## 2016-04-15 MED ORDER — ALLOPURINOL 100 MG PO TABS: 100.0000 mg | ORAL_TABLET | Freq: Every day | ORAL | Status: DC

## 2016-04-21 DIAGNOSIS — M545 Low back pain: Secondary | ICD-10-CM | POA: Diagnosis not present

## 2016-04-21 DIAGNOSIS — M4806 Spinal stenosis, lumbar region: Secondary | ICD-10-CM | POA: Diagnosis not present

## 2016-04-22 ENCOUNTER — Ambulatory Visit (INDEPENDENT_AMBULATORY_CARE_PROVIDER_SITE_OTHER): Payer: Medicare Other | Admitting: Neurology

## 2016-04-22 ENCOUNTER — Encounter: Payer: Self-pay | Admitting: Neurology

## 2016-04-22 VITALS — BP 136/80 | HR 89 | Ht 68.0 in | Wt 250.0 lb

## 2016-04-22 DIAGNOSIS — G20 Parkinson's disease: Secondary | ICD-10-CM | POA: Diagnosis not present

## 2016-04-22 DIAGNOSIS — G4752 REM sleep behavior disorder: Secondary | ICD-10-CM

## 2016-04-22 DIAGNOSIS — G20A1 Parkinson's disease without dyskinesia, without mention of fluctuations: Secondary | ICD-10-CM

## 2016-04-22 DIAGNOSIS — E538 Deficiency of other specified B group vitamins: Secondary | ICD-10-CM | POA: Diagnosis not present

## 2016-04-22 MED ORDER — CYANOCOBALAMIN 1000 MCG/ML IJ SOLN
1000.0000 ug | Freq: Once | INTRAMUSCULAR | Status: AC
  Administered 2016-04-22: 1000 ug via INTRAMUSCULAR
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
## 2016-04-22 NOTE — Addendum Note (Signed)
Addended byAnnamaria Helling on: 04/22/2016 02:33 PM   Modules accepted: Orders

## 2016-05-23 ENCOUNTER — Ambulatory Visit: Payer: Medicare Other

## 2016-05-25 ENCOUNTER — Ambulatory Visit (INDEPENDENT_AMBULATORY_CARE_PROVIDER_SITE_OTHER): Payer: Medicare Other

## 2016-05-25 DIAGNOSIS — E538 Deficiency of other specified B group vitamins: Secondary | ICD-10-CM

## 2016-05-25 MED ORDER — CYANOCOBALAMIN 1000 MCG/ML IJ SOLN
1000.0000 ug | Freq: Once | INTRAMUSCULAR | Status: AC
  Administered 2016-05-25: 1000 ug via INTRAMUSCULAR

## 2016-06-02 DIAGNOSIS — Z6838 Body mass index (BMI) 38.0-38.9, adult: Secondary | ICD-10-CM | POA: Diagnosis not present

## 2016-06-02 DIAGNOSIS — M545 Low back pain: Secondary | ICD-10-CM | POA: Diagnosis not present

## 2016-06-02 DIAGNOSIS — R030 Elevated blood-pressure reading, without diagnosis of hypertension: Secondary | ICD-10-CM | POA: Diagnosis not present

## 2016-06-02 DIAGNOSIS — M4806 Spinal stenosis, lumbar region: Secondary | ICD-10-CM | POA: Diagnosis not present

## 2016-06-15 ENCOUNTER — Other Ambulatory Visit: Payer: Self-pay

## 2016-06-15 ENCOUNTER — Telehealth: Payer: Self-pay | Admitting: Family Medicine

## 2016-06-15 MED ORDER — POTASSIUM CHLORIDE CRYS ER 20 MEQ PO TBCR: 20.0000 meq | EXTENDED_RELEASE_TABLET | Freq: Two times a day (BID) | ORAL | Status: DC

## 2016-06-15 NOTE — Telephone Encounter (Signed)
Prescription for Klor Con M20 sent to pharmacy

## 2016-06-15 NOTE — Telephone Encounter (Signed)
Pt need new Rx for R[place]on

## 2016-06-16 ENCOUNTER — Other Ambulatory Visit: Payer: Self-pay

## 2016-06-16 MED ORDER — POTASSIUM CHLORIDE CRYS ER 20 MEQ PO TBCR: 20.0000 meq | EXTENDED_RELEASE_TABLET | Freq: Two times a day (BID) | ORAL | Status: DC

## 2016-06-24 ENCOUNTER — Ambulatory Visit (INDEPENDENT_AMBULATORY_CARE_PROVIDER_SITE_OTHER): Payer: Medicare Other | Admitting: Neurology

## 2016-06-24 DIAGNOSIS — E538 Deficiency of other specified B group vitamins: Secondary | ICD-10-CM | POA: Diagnosis not present

## 2016-06-24 MED ORDER — CYANOCOBALAMIN 1000 MCG/ML IJ SOLN
1000.0000 ug | Freq: Once | INTRAMUSCULAR | Status: AC
  Administered 2016-06-24: 1000 ug via INTRAMUSCULAR

## 2016-06-24 NOTE — Progress Notes (Signed)
B12 injection to left deltoid with no apparent complications.   

## 2016-07-07 ENCOUNTER — Other Ambulatory Visit: Payer: Self-pay | Admitting: Neurology

## 2016-07-07 ENCOUNTER — Other Ambulatory Visit: Payer: Self-pay | Admitting: Family Medicine

## 2016-07-07 NOTE — Telephone Encounter (Signed)
Rx refill sent to pharmacy. 

## 2016-07-07 NOTE — Telephone Encounter (Signed)
Carbidopa Levodopa 50/200 refill requested. Per last office note- patient to remain on medication. Refill approved and sent to patient's pharmacy.

## 2016-07-14 DIAGNOSIS — M4806 Spinal stenosis, lumbar region: Secondary | ICD-10-CM | POA: Diagnosis not present

## 2016-07-14 DIAGNOSIS — M545 Low back pain: Secondary | ICD-10-CM | POA: Diagnosis not present

## 2016-07-22 ENCOUNTER — Ambulatory Visit (INDEPENDENT_AMBULATORY_CARE_PROVIDER_SITE_OTHER): Payer: Medicare Other | Admitting: *Deleted

## 2016-07-22 DIAGNOSIS — E538 Deficiency of other specified B group vitamins: Secondary | ICD-10-CM

## 2016-07-22 MED ORDER — CYANOCOBALAMIN 1000 MCG/ML IJ SOLN
1000.0000 ug | Freq: Once | INTRAMUSCULAR | Status: AC
  Administered 2016-07-22: 1000 ug via INTRAMUSCULAR

## 2016-08-01 ENCOUNTER — Ambulatory Visit: Payer: Medicare Other | Admitting: Family Medicine

## 2016-08-12 ENCOUNTER — Ambulatory Visit (INDEPENDENT_AMBULATORY_CARE_PROVIDER_SITE_OTHER): Payer: Medicare Other | Admitting: Family Medicine

## 2016-08-12 ENCOUNTER — Encounter: Payer: Self-pay | Admitting: Family Medicine

## 2016-08-12 DIAGNOSIS — E785 Hyperlipidemia, unspecified: Secondary | ICD-10-CM

## 2016-08-12 DIAGNOSIS — I5032 Chronic diastolic (congestive) heart failure: Secondary | ICD-10-CM | POA: Diagnosis not present

## 2016-08-12 DIAGNOSIS — I10 Essential (primary) hypertension: Secondary | ICD-10-CM

## 2016-08-12 DIAGNOSIS — R739 Hyperglycemia, unspecified: Secondary | ICD-10-CM | POA: Diagnosis not present

## 2016-08-12 NOTE — Progress Notes (Signed)
Subjective:  Walter Gill is a 82 y.o. year old very pleasant male patient who presents for/with See problem oriented charting ROS- continued chronic back pain, shortness of breath with walking 200 feet, no chest pain, no headache.see any ROS included in HPI as well.   Past Medical History-  Patient Active Problem List   Diagnosis Date Noted  . Spinal stenosis of lumbar region 08/01/2015    Priority: High  . Chronic diastolic heart failure ([name]) 10/29/2014    Priority: High  . Chronic pain syndrome 10/01/2014    Priority: High  . PARKINSON'S DISEASE 07/02/2007    Priority: High  . Hyperglycemia 03/17/2016    Priority: Medium  . Urinary incontinence 01/14/2015    Priority: Medium  . Hyperlipidemia 10/29/2014    Priority: Medium  . Constipation 10/24/2014    Priority: Medium  . OSA on CPAP 04/17/2013    Priority: Medium  . DVT, HX OF 10/16/2007    Priority: Medium  . Gout 07/02/2007    Priority: Medium  . Essential hypertension 07/02/2007    Priority: Medium  . B12 deficiency 06/06/2013    Priority: Low  . REM behavioral disorder 10/25/2012    Priority: Low  . PULMONARY EMBOLISM 10/08/2007    Priority: Low  . RBBB 07/02/2007    Priority: Low  . Osteoarthritis 07/02/2007    Priority: Low    Medications- reviewed and updated Current Outpatient Prescriptions  Medication Sig Dispense Refill  . acetaminophen (TYLENOL) 650 MG CR tablet Take two 650 mg Tabs twice daily for pain 60 tablet 0  . allopurinol (ZYLOPRIM) 100 MG tablet Take 1 tablet (100 mg total) by mouth daily. 30 tablet 5  . aspirin 81 MG chewable tablet Chew 81 mg by mouth at bedtime.     . bisacodyl (DULCOLAX) 5 MG EC tablet Take 2 tablets (10 mg total) by mouth every morning. 30 tablet 0  . carbidopa-levodopa (SINEMET CR) 50-200 MG tablet TAKE 1 TABLET 5 TIMES A DAY 450 tablet 1  . cholecalciferol (VITAMIN D) 1000 units tablet Take 1,000 Units by mouth daily.    . furosemide (LASIX) 40 MG tablet TAKE 1  TABLET (40 MG TOTAL) BY MOUTH 2 (TWO) TIMES DAILY. 180 tablet 3  . Magnesium Oxide 500 MG TABS Take 500 mg by mouth 2 (two) times daily.    . Mirabegron ER (MYRBETRIQ) 25 MG TB24 Take 25 mg by mouth at bedtime.     . nitrofurantoin, macrocrystal-monohydrate, (MACROBID) 100 MG capsule Take 100 mg by mouth at bedtime.     . polyethylene glycol (MIRALAX / GLYCOLAX) packet Take 17 g by mouth daily. 14 each 0  . potassium chloride SA (KLOR-CON M20) 20 MEQ tablet Take 1 tablet (20 mEq total) by mouth 2 (two) times daily. 180 tablet 2  . rosuvastatin (CRESTOR) 10 MG tablet TAKE 1 TABLET DAILY 90 tablet 3  . selegiline (ELDEPRYL) 5 MG capsule Take 5 mg by mouth 2 (two) times daily.    . tamsulosin (FLOMAX) 0.4 MG CAPS capsule TAKE 1 CAPSULE DAILY 90 capsule 3   No current facility-administered medications for this visit.     Objective: BP (!) 142/72   Pulse 69   Temp 97.8 F (36.6 C) (Oral)   Wt 246 lb 12.8 oz (111.9 kg)   SpO2 96%   BMI 37.53 kg/m  Gen: NAD, resting comfortably CV: RRR no murmurs rubs or gallops Lungs: CTAB no crackles, wheeze, rhonchi Abdomen: soft/nontender/nondistended/normal bowel sounds. Obese Ext: 1-2+ edema  Skin: warm, dry Neuro: grossly normal, moves all extremities  Assessment/Plan:  Chronic diastolic heart failure S:lasix 40 BID compliant. SOB level - can walk into a [employer]- short of breath beyond that. 2+ edema largely stable. Sleep apnea machine wasn't working- so was sleeping in chair but cannot prop leg up all the way- so swelling was not improving as much but nwo that machine is fixed, the issue is better and swelling has improved slightly. Weight down 4 pounds. He is eating better now due to help from wife A/P: Continue current medications, I do not think patient is fluid overloaded. His edema is strongly contributed to by venous stasis.   Essential hypertension S: controlled in the past on Lasix 40mg  bid BP Readings from Last 3 Encounters:  08/12/16  (!) 142/72  04/22/16 136/80  03/17/16 120/64  A/P:Continue current meds:  His blood pressure has been trending up despite weight down 4 pounds in improved diet including low salt. We discussed if trend continues at follow-up, we will need to increase his blood pressure medication. May have to reduce potassium if considering ACE inhibitor. Would not use calcium channel blocker.   Hyperlipidemia S: Well controlled on Crestor 10 mg with last LDL 41. No myalgias.   A/P: continue current medication   Hyperglycemia S: Discussed increased risk of diabetes. Exercise and diet- down 4 pounds, wife is really cut down on sweets. Patient still really enjoys his bread and does have up to 4 slices a day-not willing to work on this right now Lab Results  Component Value Date   HGBA1C 6.2 03/17/2016    A/P: We discussed the option of metformin. Patient feels that he takes so many medications that would like to avoid this. activity will likely be limited but hopeful for continued improved diet     Return in about 4 months (around 12/12/2016).  Declines flu shot today. Will get in October likely  Return precautions advised.  [name], MD

## 2016-08-12 NOTE — Patient Instructions (Signed)
Update me when you get flu shot in October  No changes in medication

## 2016-08-12 NOTE — Assessment & Plan Note (Signed)
S: controlled in the past on Lasix 40mg  bid BP Readings from Last 3 Encounters:  08/12/16 (!) 142/72  04/22/16 136/80  03/17/16 120/64  A/P:Continue current meds:  His blood pressure has been trending up despite weight down 4 pounds in improved diet including low salt. We discussed if trend continues at follow-up, we will need to increase his blood pressure medication. May have to reduce potassium if considering ACE inhibitor. Would not use calcium channel blocker.

## 2016-08-12 NOTE — Progress Notes (Signed)
Pre visit review using our clinic review tool, if applicable. No additional management support is needed unless otherwise documented below in the visit note. 

## 2016-08-12 NOTE — Assessment & Plan Note (Signed)
S: Discussed increased risk of diabetes. Exercise and diet- down 4 pounds, wife is really cut down on sweets. Patient still really enjoys his bread and does have up to 4 slices a day-not willing to work on this right now Lab Results  Component Value Date   HGBA1C 6.2 03/17/2016    A/P: We discussed the option of metformin. Patient feels that he takes so many medications that would like to avoid this. activity will likely be limited but hopeful for continued improved diet

## 2016-08-12 NOTE — Assessment & Plan Note (Signed)
S: Well controlled on Crestor 10 mg with last LDL 41. No myalgias.   A/P: continue current medication

## 2016-08-12 NOTE — Assessment & Plan Note (Signed)
S:lasix 40 BID compliant. SOB level - can walk into a [employer]- short of breath beyond that. 2+ edema largely stable. Sleep apnea machine wasn't working- so was sleeping in chair but cannot prop leg up all the way- so swelling was not improving as much but nwo that machine is fixed, the issue is better and swelling has improved slightly. Weight down 4 pounds. He is eating better now due to help from wife A/P: Continue current medications, I do not think patient is fluid overloaded. His edema is strongly contributed to by venous stasis.

## 2016-08-17 DIAGNOSIS — Z6838 Body mass index (BMI) 38.0-38.9, adult: Secondary | ICD-10-CM | POA: Diagnosis not present

## 2016-08-17 DIAGNOSIS — M545 Low back pain: Secondary | ICD-10-CM | POA: Diagnosis not present

## 2016-08-17 DIAGNOSIS — M4806 Spinal stenosis, lumbar region: Secondary | ICD-10-CM | POA: Diagnosis not present

## 2016-08-17 DIAGNOSIS — M5416 Radiculopathy, lumbar region: Secondary | ICD-10-CM | POA: Diagnosis not present

## 2016-08-17 DIAGNOSIS — R030 Elevated blood-pressure reading, without diagnosis of hypertension: Secondary | ICD-10-CM | POA: Diagnosis not present
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
## 2016-08-25 ENCOUNTER — Encounter: Payer: Self-pay | Admitting: Neurology

## 2016-08-25 ENCOUNTER — Ambulatory Visit (INDEPENDENT_AMBULATORY_CARE_PROVIDER_SITE_OTHER): Payer: Medicare Other | Admitting: Neurology

## 2016-08-25 VITALS — BP 124/74 | HR 84 | Ht 69.0 in | Wt 249.0 lb

## 2016-08-25 DIAGNOSIS — M5442 Lumbago with sciatica, left side: Secondary | ICD-10-CM

## 2016-08-25 DIAGNOSIS — E538 Deficiency of other specified B group vitamins: Secondary | ICD-10-CM

## 2016-08-25 DIAGNOSIS — G20 Parkinson's disease: Secondary | ICD-10-CM | POA: Diagnosis not present

## 2016-08-25 MED ORDER — CYANOCOBALAMIN 1000 MCG/ML IJ SOLN
1000.0000 ug | Freq: Once | INTRAMUSCULAR | Status: AC
  Administered 2016-08-25: 1000 ug via INTRAMUSCULAR

## 2016-08-29 ENCOUNTER — Encounter: Payer: Self-pay | Admitting: Clinical

## 2016-08-29 ENCOUNTER — Telehealth: Payer: Self-pay | Admitting: Clinical

## 2016-08-29 ENCOUNTER — Telehealth: Payer: Self-pay | Admitting: Neurology

## 2016-08-29 DIAGNOSIS — G20 Parkinson's disease: Secondary | ICD-10-CM

## 2016-08-29 NOTE — Telephone Encounter (Signed)
-----   Message from Ladell Pier, LCSW sent at 08/29/2016  9:42 AM EDT ----- Regarding: Aquatic (warm water therapy) referral The facility that he was at for rehab and did the aquatic therapy [facility] [place]) also does outpatient therapy. Referral for outpatient aquatic therapy can be faxed to [phone] ATTN: [place] Department Director.   I will contact the patient and let him know once referral is sent.   Thanks.

## 2016-08-29 NOTE — Telephone Encounter (Signed)
Referral faxed

## 2016-08-29 NOTE — Telephone Encounter (Signed)
Contacted pt and pt wife via telephone to update regarding referral for aquatic therapy. Notified pt and pt wife that [facility] is able to do the aquatic therapy as an outpatient service. Notified that referral has been sent. Discussed with pt wife that per [facility], if pt has incontinence issues or open wounds then [facility] will not be able to provide aquatic therapy. Per pt wife, pt has a hx of incontinence and had this diagnosis during prior rehab stay at facility and was able to participate in aquatic therapy.CSW encouraged pt wife to discuss with [facility] when they call about scheduling. Pt wife expressed understanding and agrees to discuss further with [facility] therapy department. Pt and pt wife appreciative of phone call and referral made for aquatic therapy.   Alison Murray, MSW, LCSW Clinical Social Worker Movement [facility] [phone]

## 2016-09-10 DIAGNOSIS — Z23 Encounter for immunization: Secondary | ICD-10-CM | POA: Diagnosis not present

## 2016-09-12 DIAGNOSIS — G20 Parkinson's disease: Secondary | ICD-10-CM | POA: Diagnosis not present

## 2016-09-12 DIAGNOSIS — R2681 Unsteadiness on feet: Secondary | ICD-10-CM | POA: Diagnosis not present

## 2016-09-14 DIAGNOSIS — G20 Parkinson's disease: Secondary | ICD-10-CM | POA: Diagnosis not present

## 2016-09-14 DIAGNOSIS — R2681 Unsteadiness on feet: Secondary | ICD-10-CM | POA: Diagnosis not present

## 2016-09-19 DIAGNOSIS — R2681 Unsteadiness on feet: Secondary | ICD-10-CM | POA: Diagnosis not present

## 2016-09-19 DIAGNOSIS — G20 Parkinson's disease: Secondary | ICD-10-CM | POA: Diagnosis not present

## 2016-09-21 DIAGNOSIS — R2681 Unsteadiness on feet: Secondary | ICD-10-CM | POA: Diagnosis not present

## 2016-09-21 DIAGNOSIS — G20 Parkinson's disease: Secondary | ICD-10-CM | POA: Diagnosis not present

## 2016-09-23 ENCOUNTER — Other Ambulatory Visit: Payer: Medicare Other

## 2016-09-23 ENCOUNTER — Ambulatory Visit (INDEPENDENT_AMBULATORY_CARE_PROVIDER_SITE_OTHER): Payer: Medicare Other | Admitting: Neurology

## 2016-09-23 DIAGNOSIS — E538 Deficiency of other specified B group vitamins: Secondary | ICD-10-CM

## 2016-09-23 MED ORDER — CYANOCOBALAMIN 1000 MCG/ML IJ SOLN
1000.0000 ug | Freq: Once | INTRAMUSCULAR | Status: AC
  Administered 2016-09-23: 1000 ug via INTRAMUSCULAR

## 2016-09-26 DIAGNOSIS — R2681 Unsteadiness on feet: Secondary | ICD-10-CM | POA: Diagnosis not present

## 2016-09-26 DIAGNOSIS — G20 Parkinson's disease: Secondary | ICD-10-CM | POA: Diagnosis not present

## 2016-09-28 DIAGNOSIS — G20 Parkinson's disease: Secondary | ICD-10-CM | POA: Diagnosis not present

## 2016-09-28 DIAGNOSIS — R2681 Unsteadiness on feet: Secondary | ICD-10-CM | POA: Diagnosis not present

## 2016-10-03 DIAGNOSIS — G20 Parkinson's disease: Secondary | ICD-10-CM | POA: Diagnosis not present

## 2016-10-03 DIAGNOSIS — R2681 Unsteadiness on feet: Secondary | ICD-10-CM | POA: Diagnosis not present

## 2016-10-04 ENCOUNTER — Encounter: Payer: Self-pay | Admitting: Family Medicine

## 2016-10-06 DIAGNOSIS — R2681 Unsteadiness on feet: Secondary | ICD-10-CM | POA: Diagnosis not present

## 2016-10-06 DIAGNOSIS — G20 Parkinson's disease: Secondary | ICD-10-CM | POA: Diagnosis not present

## 2016-10-10 DIAGNOSIS — R2681 Unsteadiness on feet: Secondary | ICD-10-CM | POA: Diagnosis not present

## 2016-10-10 DIAGNOSIS — G20 Parkinson's disease: Secondary | ICD-10-CM | POA: Diagnosis not present

## 2016-10-12 DIAGNOSIS — N390 Urinary tract infection, site not specified: Secondary | ICD-10-CM | POA: Diagnosis not present

## 2016-10-12 DIAGNOSIS — R2681 Unsteadiness on feet: Secondary | ICD-10-CM | POA: Diagnosis not present

## 2016-10-12 DIAGNOSIS — G20 Parkinson's disease: Secondary | ICD-10-CM | POA: Diagnosis not present

## 2016-10-13 DIAGNOSIS — M545 Low back pain: Secondary | ICD-10-CM | POA: Diagnosis not present

## 2016-10-19 DIAGNOSIS — G20 Parkinson's disease: Secondary | ICD-10-CM | POA: Diagnosis not present

## 2016-10-19 DIAGNOSIS — N390 Urinary tract infection, site not specified: Secondary | ICD-10-CM | POA: Diagnosis not present

## 2016-10-19 DIAGNOSIS — R2681 Unsteadiness on feet: Secondary | ICD-10-CM | POA: Diagnosis not present

## 2016-10-24 DIAGNOSIS — G20 Parkinson's disease: Secondary | ICD-10-CM | POA: Diagnosis not present

## 2016-10-24 DIAGNOSIS — R2681 Unsteadiness on feet: Secondary | ICD-10-CM | POA: Diagnosis not present

## 2016-10-24 DIAGNOSIS — N390 Urinary tract infection, site not specified: Secondary | ICD-10-CM | POA: Diagnosis not present

## 2016-10-27 DIAGNOSIS — N390 Urinary tract infection, site not specified: Secondary | ICD-10-CM | POA: Diagnosis not present

## 2016-10-27 DIAGNOSIS — R2681 Unsteadiness on feet: Secondary | ICD-10-CM | POA: Diagnosis not present

## 2016-10-27 DIAGNOSIS — G20 Parkinson's disease: Secondary | ICD-10-CM | POA: Diagnosis not present

## 2016-10-28 ENCOUNTER — Ambulatory Visit (INDEPENDENT_AMBULATORY_CARE_PROVIDER_SITE_OTHER): Payer: Medicare Other | Admitting: Neurology

## 2016-10-28 DIAGNOSIS — E538 Deficiency of other specified B group vitamins: Secondary | ICD-10-CM | POA: Diagnosis not present

## 2016-10-28 MED ORDER — CYANOCOBALAMIN 1000 MCG/ML IJ SOLN
1000.0000 ug | Freq: Once | INTRAMUSCULAR | Status: AC
  Administered 2016-10-28: 1000 ug via INTRAMUSCULAR

## 2016-10-28 NOTE — Progress Notes (Signed)
B12 injection to left deltoid with no apparent complications.   

## 2016-10-31 DIAGNOSIS — G20 Parkinson's disease: Secondary | ICD-10-CM | POA: Diagnosis not present

## 2016-10-31 DIAGNOSIS — N390 Urinary tract infection, site not specified: Secondary | ICD-10-CM | POA: Diagnosis not present

## 2016-10-31 DIAGNOSIS — R2681 Unsteadiness on feet: Secondary | ICD-10-CM | POA: Diagnosis not present

## 2016-11-14 DIAGNOSIS — Z6836 Body mass index (BMI) 36.0-36.9, adult: Secondary | ICD-10-CM | POA: Diagnosis not present

## 2016-11-14 DIAGNOSIS — R030 Elevated blood-pressure reading, without diagnosis of hypertension: Secondary | ICD-10-CM | POA: Diagnosis not present

## 2016-11-14 DIAGNOSIS — M5416 Radiculopathy, lumbar region: Secondary | ICD-10-CM | POA: Diagnosis not present

## 2016-11-18 ENCOUNTER — Other Ambulatory Visit: Payer: Self-pay | Admitting: Neurology

## 2016-11-23 ENCOUNTER — Other Ambulatory Visit: Payer: Self-pay | Admitting: Family Medicine

## 2016-11-28 ENCOUNTER — Ambulatory Visit: Payer: Medicare Other

## 2016-11-29 ENCOUNTER — Ambulatory Visit (INDEPENDENT_AMBULATORY_CARE_PROVIDER_SITE_OTHER): Payer: Medicare Other | Admitting: Neurology

## 2016-11-29 DIAGNOSIS — E538 Deficiency of other specified B group vitamins: Secondary | ICD-10-CM | POA: Diagnosis not present

## 2016-11-29 MED ORDER — CYANOCOBALAMIN 1000 MCG/ML IJ SOLN
1000.0000 ug | Freq: Once | INTRAMUSCULAR | Status: AC
  Administered 2016-11-29: 1000 ug via INTRAMUSCULAR

## 2016-11-29 NOTE — Progress Notes (Signed)
B12 injection to right deltoid with no apparent complications.   

## 2016-12-19 ENCOUNTER — Ambulatory Visit (INDEPENDENT_AMBULATORY_CARE_PROVIDER_SITE_OTHER): Payer: Medicare Other | Admitting: Family Medicine

## 2016-12-19 ENCOUNTER — Encounter: Payer: Self-pay | Admitting: Family Medicine

## 2016-12-19 VITALS — BP 128/68 | HR 68 | Temp 97.5°F | Ht 69.0 in | Wt 242.8 lb

## 2016-12-19 DIAGNOSIS — I5032 Chronic diastolic (congestive) heart failure: Secondary | ICD-10-CM | POA: Diagnosis not present

## 2016-12-19 DIAGNOSIS — E785 Hyperlipidemia, unspecified: Secondary | ICD-10-CM

## 2016-12-19 DIAGNOSIS — R739 Hyperglycemia, unspecified: Secondary | ICD-10-CM | POA: Diagnosis not present

## 2016-12-19 DIAGNOSIS — I10 Essential (primary) hypertension: Secondary | ICD-10-CM | POA: Diagnosis not present

## 2016-12-19 NOTE — Assessment & Plan Note (Signed)
S: down 4 lbs from last visit. Has declined metformin Lab Results  Component Value Date   HGBA1C 6.2 03/17/2016  A/P: update a1c today. Congratulated on weight loss

## 2016-12-19 NOTE — Progress Notes (Signed)
Pre visit review using our clinic review tool, if applicable. No additional management support is needed unless otherwise documented below in the visit note. 

## 2016-12-19 NOTE — Assessment & Plan Note (Signed)
S: well controlled on crestor 10mg  with LDL 41 last april. No myalgias.   A/P: no changes

## 2016-12-19 NOTE — Assessment & Plan Note (Signed)
S: controlled on lasix 40mg  BID.  BP Readings from Last 3 Encounters:  12/19/16 128/68  08/25/16 124/74  08/12/16 (!) 142/72  A/P:Continue current meds:  Doing well

## 2016-12-19 NOTE — Patient Instructions (Signed)
Labs before you leave  No changes in medicine other than would restart the miralax and can boost with dulcolax to help you with the constipation

## 2016-12-19 NOTE — Progress Notes (Signed)
Subjective:  Walter Gill is a 83 y.o. year old very pleasant male patient who presents for/with See problem oriented charting ROS- stable SOB, no fever or chills. No chest pain. Stable edema.    Past Medical History-  Patient Active Problem List   Diagnosis Date Noted  . Spinal stenosis of lumbar region 08/01/2015    Priority: High  . Chronic diastolic heart failure ([place]) 10/29/2014    Priority: High  . Chronic pain syndrome 10/01/2014    Priority: High  . PARKINSON'S DISEASE 07/02/2007    Priority: High  . Hyperglycemia 03/17/2016    Priority: Medium  . Urinary incontinence 01/14/2015    Priority: Medium  . Hyperlipidemia 10/29/2014    Priority: Medium  . Constipation 10/24/2014    Priority: Medium  . OSA on CPAP 04/17/2013    Priority: Medium  . DVT, HX OF 10/16/2007    Priority: Medium  . Gout 07/02/2007    Priority: Medium  . Essential hypertension 07/02/2007    Priority: Medium  . B12 deficiency 06/06/2013    Priority: Low  . REM behavioral disorder 10/25/2012    Priority: Low  . PULMONARY EMBOLISM 10/08/2007    Priority: Low  . RBBB 07/02/2007    Priority: Low  . Osteoarthritis 07/02/2007    Priority: Low    Medications- reviewed and updated Current Outpatient Prescriptions  Medication Sig Dispense Refill  . acetaminophen (TYLENOL) 650 MG CR tablet Take two 650 mg Tabs twice daily for pain 60 tablet 0  . allopurinol (ZYLOPRIM) 100 MG tablet TAKE 1 TABLET BY MOUTH DAILY 30 tablet 2  . aspirin 81 MG chewable tablet Chew 81 mg by mouth at bedtime.     . bisacodyl (DULCOLAX) 5 MG EC tablet Take 2 tablets (10 mg total) by mouth every morning. 30 tablet 0  . carbidopa-levodopa (SINEMET CR) 50-200 MG tablet TAKE 1 TABLET 5 TIMES A DAY 450 tablet 1  . cholecalciferol (VITAMIN D) 1000 units tablet Take 1,000 Units by mouth daily.    . furosemide (LASIX) 40 MG tablet TAKE 1 TABLET (40 MG TOTAL) BY MOUTH 2 (TWO) TIMES DAILY. 180 tablet 3  . Magnesium Oxide 500 MG  TABS Take 500 mg by mouth 2 (two) times daily.    . Mirabegron ER (MYRBETRIQ) 25 MG TB24 Take 25 mg by mouth at bedtime.     . nitrofurantoin, macrocrystal-monohydrate, (MACROBID) 100 MG capsule Take 100 mg by mouth at bedtime.     . polyethylene glycol (MIRALAX / GLYCOLAX) packet Take 17 g by mouth daily. 14 each 0  . potassium chloride SA (KLOR-CON M20) 20 MEQ tablet Take 1 tablet (20 mEq total) by mouth 2 (two) times daily. 180 tablet 2  . rosuvastatin (CRESTOR) 10 MG tablet TAKE 1 TABLET DAILY 90 tablet 3  . selegiline (ELDEPRYL) 5 MG capsule Take 5 mg by mouth 2 (two) times daily.    . selegiline (ELDEPRYL) 5 MG capsule TAKE 1 CAPSULE TWICE DAILY BEFORE MEALS 180 capsule 3  . tamsulosin (FLOMAX) 0.4 MG CAPS capsule TAKE 1 CAPSULE DAILY 90 capsule 3   No current facility-administered medications for this visit.     Objective: BP 128/68 (BP Location: Left Arm, Patient Position: Sitting, Cuff Size: Large)   Pulse 68   Temp 97.5 F (36.4 C) (Oral)   Ht 5\' 9"  (1.753 m)   Wt 242 lb 12.8 oz (110.1 kg)   SpO2 97%   BMI 35.86 kg/m  Gen: NAD, resting comfortably CV: RRR  no murmurs rubs or gallops Lungs: CTAB no crackles, wheeze, rhonchi Abdomen: soft/nontender/nondistended/normal bowel sounds. No rebound or guarding.  Ext: 2+ edema Skin: warm, dry Neuro: grossly normal, moves all extremities  Assessment/Plan:  Chronic diastolic heart failure S: taking lasix 40mg  BID. NYHA III- can walk into [employer] but SOB beyond that. 2+ edema stable. Weight down 4 lbs from last visit with me 08/12/16 A/P: appears stable, continue current medicine  Essential hypertension S: controlled on lasix 40mg  BID.  BP Readings from Last 3 Encounters:  12/19/16 128/68  08/25/16 124/74  08/12/16 (!) 142/72  A/P:Continue current meds:  Doing well  Hyperlipidemia S: well controlled on crestor 10mg  with LDL 41 last april. No myalgias.   A/P: no changes  Hyperglycemia S: down 4 lbs from last visit. Has  declined metformin Lab Results  Component Value Date   HGBA1C 6.2 03/17/2016  A/P: update a1c today. Congratulated on weight loss  Also has been more constipated lately- but had not been using miralax regularly. Restarted this along with dulcolax and started to have some movements. He has felt less active since having no BM and bloating. Wife to let me know if not improving with restarting the miralax  4 months  Orders Placed This Encounter  Procedures  . CBC    [place]  . Basic metabolic panel    [employer]  . Hemoglobin A1c    [place]  . LDL cholesterol, direct    [place]   Return precautions advised.  [name], MD

## 2016-12-19 NOTE — Assessment & Plan Note (Signed)
S: taking lasix 40mg  BID. NYHA III- can walk into [employer] but SOB beyond that. 2+ edema stable. Weight down 4 lbs from last visit with me 08/12/16 A/P: appears stable, continue current medicine

## 2016-12-20 ENCOUNTER — Other Ambulatory Visit: Payer: Self-pay | Admitting: *Deleted

## 2016-12-20 LAB — CBC
HCT: 39 % (ref 39.0–52.0)
HEMOGLOBIN: 12.8 g/dL — AB (ref 13.0–17.0)
MCHC: 32.7 g/dL (ref 30.0–36.0)
MCV: 83.6 fl (ref 78.0–100.0)
Platelets: 187 10*3/uL (ref 150.0–400.0)
RBC: 4.66 Mil/uL (ref 4.22–5.81)
RDW: 15.3 % (ref 11.5–15.5)
WBC: 5.4 10*3/uL (ref 4.0–10.5)

## 2016-12-20 LAB — BASIC METABOLIC PANEL
BUN: 30 mg/dL — ABNORMAL HIGH (ref 6–23)
CHLORIDE: 101 meq/L (ref 96–112)
CO2: 31 mEq/L (ref 19–32)
CREATININE: 0.96 mg/dL (ref 0.40–1.50)
Calcium: 9.6 mg/dL (ref 8.4–10.5)
GFR: 79.9 mL/min (ref >60.00)
Glucose, Bld: 96 mg/dL (ref 70–99)
Potassium: 4.5 mEq/L (ref 3.5–5.1)
SODIUM: 140 meq/L (ref 135–145)

## 2016-12-20 LAB — HEMOGLOBIN A1C: Hgb A1c MFr Bld: 6.1 % (ref 4.6–6.5)

## 2016-12-20 LAB — LDL CHOLESTEROL, DIRECT: LDL DIRECT: 32 mg/dL

## 2016-12-20 MED ORDER — ALLOPURINOL 100 MG PO TABS: 100.0000 mg | ORAL_TABLET | Freq: Every day | ORAL | 0 refills | Status: DC
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
## 2016-12-29 ENCOUNTER — Ambulatory Visit: Payer: Medicare Other | Admitting: Neurology

## 2017-01-05 DIAGNOSIS — N3281 Overactive bladder: Secondary | ICD-10-CM | POA: Diagnosis not present

## 2017-01-05 DIAGNOSIS — N401 Enlarged prostate with lower urinary tract symptoms: Secondary | ICD-10-CM | POA: Diagnosis not present

## 2017-01-05 DIAGNOSIS — Z125 Encounter for screening for malignant neoplasm of prostate: Secondary | ICD-10-CM | POA: Diagnosis not present

## 2017-01-06 ENCOUNTER — Ambulatory Visit (INDEPENDENT_AMBULATORY_CARE_PROVIDER_SITE_OTHER): Payer: Medicare Other

## 2017-01-06 DIAGNOSIS — E538 Deficiency of other specified B group vitamins: Secondary | ICD-10-CM

## 2017-01-06 MED ORDER — CYANOCOBALAMIN 1000 MCG/ML IJ SOLN
1000.0000 ug | Freq: Once | INTRAMUSCULAR | Status: AC
  Administered 2017-01-06: 1000 ug via INTRAMUSCULAR

## 2017-01-09 ENCOUNTER — Telehealth: Payer: Self-pay

## 2017-01-09 NOTE — Telephone Encounter (Signed)
Call to Ms. Kushner regarding AWV. Explained the visit. She stated mr Karnes does have Parkinson's but is doing better now. Stated they are in their home. She is in PT trying to improve her mobility.  Agreed to fup when they come back to see Dr. Yong Channel. States the have handrails up in the bathroom, but sometimes other info may be helpful.  States other LTC options are not affordable to them now. Defer AWV for she and spouse
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
## 2017-01-30 ENCOUNTER — Ambulatory Visit (INDEPENDENT_AMBULATORY_CARE_PROVIDER_SITE_OTHER): Payer: Medicare Other | Admitting: Neurology

## 2017-01-30 ENCOUNTER — Encounter: Payer: Self-pay | Admitting: Neurology

## 2017-01-30 VITALS — BP 120/68 | HR 88 | Ht 70.0 in | Wt 240.0 lb

## 2017-01-30 DIAGNOSIS — R252 Cramp and spasm: Secondary | ICD-10-CM

## 2017-01-30 DIAGNOSIS — F444 Conversion disorder with motor symptom or deficit: Secondary | ICD-10-CM | POA: Diagnosis not present

## 2017-01-30 DIAGNOSIS — G20 Parkinson's disease: Secondary | ICD-10-CM

## 2017-01-30 DIAGNOSIS — E538 Deficiency of other specified B group vitamins: Secondary | ICD-10-CM | POA: Diagnosis not present

## 2017-01-30 MED ORDER — CYANOCOBALAMIN 1000 MCG/ML IJ SOLN
1000.0000 ug | Freq: Once | INTRAMUSCULAR | Status: AC
  Administered 2017-01-30: 1000 ug via INTRAMUSCULAR

## 2017-01-30 NOTE — Patient Instructions (Addendum)
1. We have sent a referral to Dr. Naaman Plummer. If you do not hear from them please call [phone].

## 2017-02-07 ENCOUNTER — Encounter: Payer: Self-pay | Admitting: Physical Medicine & Rehabilitation

## 2017-02-13 ENCOUNTER — Other Ambulatory Visit: Payer: Self-pay | Admitting: Family Medicine

## 2017-02-13 ENCOUNTER — Other Ambulatory Visit: Payer: Self-pay | Admitting: Neurology

## 2017-03-01 ENCOUNTER — Ambulatory Visit: Payer: Medicare Other

## 2017-03-08 ENCOUNTER — Ambulatory Visit (INDEPENDENT_AMBULATORY_CARE_PROVIDER_SITE_OTHER): Payer: Medicare Other | Admitting: Neurology

## 2017-03-08 DIAGNOSIS — E538 Deficiency of other specified B group vitamins: Secondary | ICD-10-CM | POA: Diagnosis not present

## 2017-03-08 MED ORDER — CYANOCOBALAMIN 1000 MCG/ML IJ SOLN
1000.0000 ug | Freq: Once | INTRAMUSCULAR | Status: AC
  Administered 2017-03-08: 1000 ug via INTRAMUSCULAR

## 2017-03-08 NOTE — Progress Notes (Signed)
B12 injection to left deltoid with no apparent complications.   

## 2017-03-18 ENCOUNTER — Other Ambulatory Visit: Payer: Self-pay | Admitting: Family Medicine

## 2017-03-22 ENCOUNTER — Encounter: Payer: Self-pay | Admitting: Physical Medicine & Rehabilitation

## 2017-03-22 ENCOUNTER — Other Ambulatory Visit: Payer: Self-pay | Admitting: Physical Medicine & Rehabilitation

## 2017-03-22 ENCOUNTER — Encounter: Payer: Medicare Other | Attending: Physical Medicine & Rehabilitation | Admitting: Physical Medicine & Rehabilitation

## 2017-03-22 ENCOUNTER — Ambulatory Visit
Admission: RE | Admit: 2017-03-22 | Discharge: 2017-03-22 | Disposition: A | Discharge status: Home / Self Care | Payer: Medicare Other | Source: Ambulatory Visit | Attending: Physical Medicine & Rehabilitation | Admitting: Physical Medicine & Rehabilitation

## 2017-03-22 VITALS — BP 194/92 | HR 71 | Resp 14

## 2017-03-22 DIAGNOSIS — M109 Gout, unspecified: Secondary | ICD-10-CM | POA: Insufficient documentation

## 2017-03-22 DIAGNOSIS — G20 Parkinson's disease: Secondary | ICD-10-CM

## 2017-03-22 DIAGNOSIS — M1711 Unilateral primary osteoarthritis, right knee: Secondary | ICD-10-CM | POA: Diagnosis not present

## 2017-03-22 DIAGNOSIS — Z9049 Acquired absence of other specified parts of digestive tract: Secondary | ICD-10-CM | POA: Diagnosis not present

## 2017-03-22 DIAGNOSIS — Z809 Family history of malignant neoplasm, unspecified: Secondary | ICD-10-CM | POA: Diagnosis not present

## 2017-03-22 DIAGNOSIS — Z86718 Personal history of other venous thrombosis and embolism: Secondary | ICD-10-CM | POA: Insufficient documentation

## 2017-03-22 DIAGNOSIS — Z8249 Family history of ischemic heart disease and other diseases of the circulatory system: Secondary | ICD-10-CM | POA: Insufficient documentation

## 2017-03-22 DIAGNOSIS — G4730 Sleep apnea, unspecified: Secondary | ICD-10-CM | POA: Insufficient documentation

## 2017-03-22 DIAGNOSIS — R293 Abnormal posture: Secondary | ICD-10-CM | POA: Diagnosis not present

## 2017-03-22 DIAGNOSIS — M47816 Spondylosis without myelopathy or radiculopathy, lumbar region: Secondary | ICD-10-CM

## 2017-03-22 DIAGNOSIS — I10 Essential (primary) hypertension: Secondary | ICD-10-CM | POA: Insufficient documentation

## 2017-03-22 DIAGNOSIS — Z96641 Presence of right artificial hip joint: Secondary | ICD-10-CM | POA: Insufficient documentation

## 2017-03-22 DIAGNOSIS — M48061 Spinal stenosis, lumbar region without neurogenic claudication: Secondary | ICD-10-CM | POA: Diagnosis not present

## 2017-03-22 DIAGNOSIS — Z9841 Cataract extraction status, right eye: Secondary | ICD-10-CM | POA: Diagnosis not present

## 2017-03-22 DIAGNOSIS — E785 Hyperlipidemia, unspecified: Secondary | ICD-10-CM | POA: Insufficient documentation

## 2017-03-22 DIAGNOSIS — Z9842 Cataract extraction status, left eye: Secondary | ICD-10-CM | POA: Insufficient documentation

## 2017-03-22 MED ORDER — MELOXICAM 7.5 MG PO TABS: 7.5000 mg | ORAL_TABLET | Freq: Every day | ORAL | 4 refills | Status: DC

## 2017-03-22 NOTE — Patient Instructions (Signed)
SUPPLEMENTS USEFUL FOR OSTEOARTHRITIS: OMEGA 3 FATTY ACIDS, TURMERIC, GINGER, TART CHERRY EXTRACT,  GLUCOSAMINE WITH CHONDROITIN

## 2017-03-22 NOTE — Progress Notes (Signed)
Subjective:    Patient ID: TRESEAN MATTIX, male    DOB: 1934/07/31, 84 y.o.   MRN: [id number]  HPI   Mr. Nix is a pleasant 83 yo male referred here today by Dr. Wells Guiles Tat for an  evaluation of muscle tightness and postural problems. He was diagnosed with PD in the early 2000's and has been treated at [facility] as well as [facility]. Most recently his Parkinson's disease has been managed by Dr. Carles Collet.  His most significant problems and complications related to his PD are muscle stiffness/ truncal dystonia (pisa syndrome) as well as gait abnormalities. Dr. Carles Collet is treating his PD currently with selegline BID and sinemet  5x daily. He is quite sensitive to the timing of his sinemet in relation to his muscle stiffness.   The patient feels that his tightness is more severe in the right hamstring and right low back. He tends to lean to the right when he walks (with walker). He has had one fall within the last year (in bathroom).   He has been somewhat resistant in general to therapy but has participated in therapy at neuro-rehab at [facility] as well as aquatic therapies (summer 2017)---at [facility]---he feels that the aquatic therapy was the most helpful therapy he's had as he felt that it  "loosened him up" albeit from a temporary standpoint.  He has seen Dr. Ned Card for lumbar severe lumbar stenosis at L4-5, multilevel spondylosis and ultimately was felt not to be a surgical candidate. Injections provided only modest relief for his low back pain. He has tried muscle relaxants which cause severe constipation.   I personally reviewed his lumbar MRI from 07/20/15 findings of which are summarized below:  L2-L3: Interval disc desiccation, disc space loss, and circumferential disc bulge. Progressed and moderate facet and ligament flavum hypertrophy. Trace facet joint fluid. New multifactorial spinal stenosis is mild while right lateral recess and right L2 foraminal stenosis is moderate.  L3-L4:  Interval progressed disc desiccation and circumferential disc bulge. Moderate to severe facet hypertrophy is increased with right greater than left facet joint fluid. Increased multifactorial mild spinal and right lateral recess stenosis. Increased moderate bilateral L3 foraminal stenosis.  L4-L5: Increase trace anterolisthesis. Progressed and bulky circumferential disc osteophyte complex. Severe facet and ligament flavum hypertrophy has increased with new right side facet joint fluid. Severe spinal and lateral recess stenosis has progressed. Moderate bilateral L4 foraminal stenosis is increased.  L5-S1: Largely stable disc desiccation and circumferential disc osteophyte complex. Mild lateral recess stenosis does appear increased. No significant spinal stenosis. Mild left L5 foraminal stenosis appears stable.   His history is also significant for a Right THA around 2002 and significant OA of the right knee. He has seen Dr. Gaynelle Arabian in the past and was told he had "bone on bone arthritis" but was deemed a non-surgical candidate due to his comorbidities. He also has OSA and has a CPAP machine but has had problems with mask fit/nocturnal restlessness and he tends not to use. He typically sleeps in his recliner instead with reasonable results.     Pain Inventory Average Pain 5 Pain Right Now 5 My pain is sharp  In the last 24 hours, has pain interfered with the following? General activity 3 Relation with others 7 Enjoyment of life 7 What TIME of day is your pain at its worst? varies Sleep (in general) Fair  Pain is worse with: walking, standing and some activites Pain improves with: rest and injections Relief from  Meds: no selection  Mobility walk with assistance use a walker how many minutes can you walk? 5 ability to climb steps?  no do you drive?  no Do you have any goals in this area?  yes  Function retired I need assistance with the following:  dressing, meal  prep, household duties and shopping  Neuro/Psych bladder control problems bowel control problems weakness numbness tingling trouble walking depression  Prior Studies bone scan x-rays CT/MRI nerve study new visit  Physicians involved in your care new visit   Family History  Problem Relation Age of Onset  . Heart disease Mother     had murmur, never went to doctor  . Cancer Father     possible gi   Social History   Social History  . Marital status: Married    Spouse name: N/A  . Number of children: N/A  . Years of education: N/A   Occupational History  . 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 Smoking status: Never Smoker  . Smokeless tobacco: Never Used  . Alcohol use No  . Drug use: No  . Sexual activity: Not Asked   Other Topics Concern  . None   Social History Narrative   Married. 3 children. 6 grandkids and 1 stepgrandson. No greatgrandkids.       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 [place] [employer]. [employer].          Past Surgical History:  Procedure Laterality Date  . APPENDECTOMY    . CATARACT EXTRACTION     bilateral  . CHOLECYSTECTOMY    . dupruytens contracture     left  . EYE SURGERY     ? for lid lag vs apraxia of eye opening  . pilonidal cyst removal    . TOTAL HIP ARTHROPLASTY  9/8/8   right   Past Medical History:  Diagnosis Date  . DVT (deep venous thrombosis) ([place])   . Gout   . Hyperlipidemia   . Hypertension   . HYPERTENSION 07/02/2007   Qualifier: Diagnosis of  By: Leanne Chang MD, Bruce    . Left ventricular dysfunction    impaired LV relaxation  . OA (osteoarthritis)   . Parkinson disease ([place])   . PE (pulmonary embolism) september 2008  . RBBB (right bundle branch block)   . Sleep apnea    BP (!) 194/92   Pulse 71   Resp 14   SpO2 98%   Opioid Risk Score:   Fall Risk Score:  `1  Depression screen PHQ  2/9  Depression screen [facility] 2/9 03/17/2016 01/14/2015 04/17/2013  Decreased Interest 0 0 0  Down, Depressed, Hopeless 0 0 0  PHQ - 2 Score 0 0 0  Some recent data might be hidden    Review of Systems  HENT: Negative.   Eyes: Negative.   Respiratory: Negative.   Cardiovascular: Positive for leg swelling.  Gastrointestinal: Positive for constipation.  Endocrine: Negative.        High blood sugar  Genitourinary: Positive for difficulty urinating.  Musculoskeletal: Positive for back pain.  Skin: Negative.   Allergic/Immunologic: Negative.   Neurological: Positive for weakness and numbness.       Tingling   Hematological: Negative.   Psychiatric/Behavioral: Positive for dysphoric mood.  All other systems reviewed and are negative.      Objective:   Physical Exam  General: Alert and oriented x 3, No apparent distress. obese HEENT: Head is normocephalic, atraumatic, PERRLA, EOMI, sclera anicteric, oral mucosa pink and moist, dentition intact, ext ear canals clear,  Neck: Supple without JVD or lymphadenopathy Heart: Reg rate and rhythm.   Chest: CTA bilaterally without wheezes,   Abdomen: Soft, non-tender, non-distended, bowel sounds positive. Extremities: No clubbing, cyanosis, or edema. Pulses are 2+ Skin: Clean and intact without signs of breakdown Neuro: Pt is cognitively appropriate with normal insight, memory, and awareness. Cranial nerves 2-12 are intact. ?decreased LT in median distribution of either hands. . Reflexes are tr to absent in all 4's. Fine motor coordination is decreased. Periodic resting tremor is noted as well as occasional jerking movements of left arm. Does have some mild resting rigidity in both legs and arms. Motor function is grossly 4-5/5 in the upper extremities and 3+ to 4/5 HF, RKE 4-/5, LKE 4/5. ADF/PF 4-5/5 bilaterally Motor function is grossly 5/5. There is pain inhibition with use of RLE. While the right hamstring is somewhat "tight" it's difficult to  appreciate resting tone in the muscle except for the biceps femoris.  Musculoskeletal: pt cannot achieve neutral extension in standing. He leans to the right at a 20 degree angle and is flexed forward nearly 25-30 degrees. At the same time his pelvis is tilted to the right, his right knee is flexed at 20 degrees and there is a valgus deformity of the right knee as well. I was unable to achieve full extension of the right knee with the patient seated. He may have had some tightness of the right biceps femoris. Right knee is sclerosed and has crepitus with AROM/PROM. There is a mild compensatory leftward curve of the lumbar spine but it is generally fairly neutral along the angle of his pelvic/trunk bend. There is generalized muscle spasm in the right lumbar paraspinals but he was not tender to palpation in this area. PSIS non-tender, greater trochs non-tender. He ambulates with reasonable leg swing. I did not see a shuffling gait pattern. His valgus deformity increases with weight bearing and there is definite antalgia with weight bearing on the right side.  Psych: Pt's affect is appropriate. Pt is cooperative and quite pleasant        Assessment & Plan:   1. Parkinson's disease 2. 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  There is certainly underlying tightness in core muscle mechanism although this is not true muscle spasticity   Plan: 1. I would like to start from the "ground up" so to speak and address is right knee. I have ordered xrays of the right knee which I will review. Pending those findings, will set him up for a steroid injection to the right knee. Additionally, I provided he and his wife a list of supplements he could try to help with joint pain. Bracing could be an option as well. 2.  At next visit, in addition to the potential knee injection, will perform botox to the right hamstring, (likely biceps femoris) to help facilitate better knee and back ROM.  3. I provided the patient and wife an extensive list of knee ROM and strengthening exercises to work on at home 4. For the short term I have added low dose meloxicam, 7.5mg  daily with food. I advised the patient and wife that he should stop the  medication if he has any signs of nausea or GI upset 5. I do think working with a physical therapist more familiar in low back/posture would be of use, possibly focusing on pilates based principles to help improve his trunk strength and posture. Will discuss more with him at future visits. 6. I discussed with Mr. Bartoli and his wife at length that this a multifactorial problem and that it will require a holistic approach if he hopes to see any improvement. My biggest concern here is that the osteoarthritis in his right knee is so advanced that options to manage the knee pain/ROM may be pretty limited.   I would like to thank Dr. Carles Collet for the opportunity to meet Mr. Belvedere and his pleasant wife today.   I will see him back in about a month for injections. One hour of face to face patient care time was spent during this visit. All questions were encouraged and answered. Greater than 50% of time during this encounter was spent counseling patient/family in regard to mechanisms of his problem, potential treatment options, film review, etc  Meredith Staggers, MD, [place] 03/22/2017 .

## 2017-03-24 ENCOUNTER — Telehealth: Payer: Self-pay | Admitting: Physical Medicine & Rehabilitation

## 2017-03-24 NOTE — Telephone Encounter (Signed)
Please let pt know.Marland KitchenMarland KitchenMarland KitchenI reviewed Mr. Walter Gill's knee- xray.  Severe OA especially in lateral knee. A lot of "bone spurs".  Can proceed with injection at next visit. Also, any headway into acquiring his lumbar MRI from 2016? [facility] hospital)  thx

## 2017-03-31 NOTE — Telephone Encounter (Signed)
I spoke with Mrs Geraldo.  She said his MRI was at [facility] (there is a 2016 lumbar MRi in epic).

## 2017-03-31 NOTE — Telephone Encounter (Signed)
He had told me Walter Gill, so I suppose I didn't check our records (I thought I had). The findings on the lumbar MRI are severe. I will discuss with him at his next visit.

## 2017-04-20 ENCOUNTER — Encounter: Payer: Self-pay | Admitting: Family Medicine

## 2017-04-20 ENCOUNTER — Ambulatory Visit (INDEPENDENT_AMBULATORY_CARE_PROVIDER_SITE_OTHER): Payer: Medicare Other | Admitting: Family Medicine

## 2017-04-20 VITALS — BP 136/72 | HR 68 | Temp 97.7°F | Ht 68.0 in | Wt 238.4 lb

## 2017-04-20 DIAGNOSIS — R739 Hyperglycemia, unspecified: Secondary | ICD-10-CM

## 2017-04-20 DIAGNOSIS — I5032 Chronic diastolic (congestive) heart failure: Secondary | ICD-10-CM | POA: Diagnosis not present

## 2017-04-20 DIAGNOSIS — I10 Essential (primary) hypertension: Secondary | ICD-10-CM

## 2017-04-20 DIAGNOSIS — Z0000 Encounter for general adult medical examination without abnormal findings: Secondary | ICD-10-CM

## 2017-04-20 DIAGNOSIS — M1A9XX0 Chronic gout, unspecified, without tophus (tophi): Secondary | ICD-10-CM

## 2017-04-20 LAB — BASIC METABOLIC PANEL
BUN: 31 mg/dL — AB (ref 6–23)
CHLORIDE: 103 meq/L (ref 96–112)
CO2: 31 meq/L (ref 19–32)
CREATININE: 1 mg/dL (ref 0.40–1.50)
Calcium: 9.5 mg/dL (ref 8.4–10.5)
GFR: 76.16 mL/min (ref >60.00)
Glucose, Bld: 107 mg/dL — ABNORMAL HIGH (ref 70–99)
Potassium: 4.3 mEq/L (ref 3.5–5.1)
Sodium: 141 mEq/L (ref 135–145)

## 2017-04-20 LAB — CBC
HCT: 40.3 % (ref 39.0–52.0)
HEMOGLOBIN: 12.9 g/dL — AB (ref 13.0–17.0)
MCHC: 32.2 g/dL (ref 30.0–36.0)
MCV: 84.2 fl (ref 78.0–100.0)
Platelets: 169 10*3/uL (ref 150.0–400.0)
RBC: 4.78 Mil/uL (ref 4.22–5.81)
RDW: 15.3 % (ref 11.5–15.5)
WBC: 5.4 10*3/uL (ref 4.0–10.5)

## 2017-04-20 LAB — URIC ACID: Uric Acid, Serum: 4.9 mg/dL (ref 4.0–7.8)

## 2017-04-20 LAB — HEMOGLOBIN A1C: Hgb A1c MFr Bld: 6.1 % (ref 4.6–6.5)

## 2017-04-20 NOTE — Patient Instructions (Addendum)
Please stop by lab before you go     ___________________________________________  WE NOW OFFER   [facility] FAST TRACK!!!  SAME DAY Appointments for ACUTE CARE  Such as: Sprains, Injuries, cuts, abrasions, rashes, muscle pain, joint pain, back pain Colds, flu, sore throats, headache, allergies, cough, fever  Ear pain, sinus and eye infections Abdominal pain, nausea, vomiting, diarrhea, upset stomach Animal/insect bites  3 Easy Ways to Schedule: Walk-In Scheduling Call in scheduling Mychart Sign-up: [email or web address]    Walter Gill , Thank you for taking time to come for your Medicare Wellness Visit. I appreciate your ongoing commitment to your health goals. Please review the following plan we discussed and let me know if I can assist you in the future.   To bring in a copy of the HCPOA and living will    [employer]; [place]; THEY HELP WITH SOME MINOR REHAB LIKE PUTTING BARS UP AND RAMPS AND WILL HELP INSTALLATION Sr. Line; [phone] Get resource to get information on any and all community programs for Seniors  [place]: [phone] Community Health Response Program -[phone] Public Health Dept; Need to be a skilled visit but can assist with bathing as well; [phone]  Adult center for Enrichment;  Call Senior Line; [phone]  Adult day services include Adult Day Care, Adult Day Healthcare, Group Respite, Care Partners, Volunteer In [employer], Education and Support Program   Dept of Social Services; Call [phone] and ask for SW on call  Options for Medicaid include the Community Alternatives program; [place] (personal care services) or PACE program, which is a medical and social program combined  TO [place]; Braulio Conte manages the community Alternatives program at the [place]; [phone] (this is a program with a  waiting list but provides SNF care at home;  [facility] 2 required; Call Claiborne Billings and she will send out packet of information   Caregiver support group and information regarding [place] is at the; [facility] Address: [address] [place], [place] [address]  Phone: [phone]   [employer] general resources for food etc   Http://nihseniorhealth.giv  [facility] Hard of [place] - can assist with hearing aid x 1  No reviews  [place]  [address]  [phone]  Resource to find disability equipment (used) online; [employer]    These are the goals we discussed: Goals    . patient          To maintain and hopefully MD can help with this        This is a list of the screening recommended for you and due dates:  Health Maintenance  Topic Date Due  . Flu Shot  07/12/2017  . Tetanus Vaccine  05/04/2021  . Pneumonia vaccines  Completed     Health Maintenance, Male A healthy lifestyle and preventive care is important for your health and wellness. Ask your health care provider about what schedule of regular examinations is right for you. What should I know about weight and diet?  Eat a Healthy Diet  Eat plenty of vegetables, fruits, whole grains, low-fat dairy products, and lean protein.  Do not eat a lot of foods high in solid fats, added sugars, or salt. Maintain a Healthy Weight  Regular exercise can help you achieve or maintain a healthy weight. You should:  Do at least 150 minutes of exercise  each week. The exercise should increase your heart rate and make you sweat (moderate-intensity exercise).  Do strength-training exercises at least twice a week. Watch Your Levels of Cholesterol and Blood Lipids  Have your blood tested for lipids and cholesterol every 5 years starting at 81 years of age. If you are at high risk for heart disease, you should start  having your blood tested when you are 81 years old. You may need to have your cholesterol levels checked more often if:  Your lipid or cholesterol levels are high.  You are older than 81 years of age.  You are at high risk for heart disease. What should I know about cancer screening? Many types of cancers can be detected early and may often be prevented. Lung Cancer  You should be screened every year for lung cancer if:  You are a current smoker who has smoked for at least 30 years.  You are a former smoker who has quit within the past 15 years.  Talk to your health care provider about your screening options, when you should start screening, and how often you should be screened. Colorectal Cancer  Routine colorectal cancer screening usually begins at 81 years of age and should be repeated every 5-10 years until you are 81 years old. You may need to be screened more often if early forms of precancerous polyps or small growths are found. Your health care provider may recommend screening at an earlier age if you have risk factors for colon cancer.  Your health care provider may recommend using home test kits to check for hidden blood in the stool.  A small camera at the end of a tube can be used to examine your colon (sigmoidoscopy or colonoscopy). This checks for the earliest forms of colorectal cancer. Prostate and Testicular Cancer  Depending on your age and overall health, your health care provider may do certain tests to screen for prostate and testicular cancer.  Talk to your health care provider about any symptoms or concerns you have about testicular or prostate cancer. Skin Cancer  Check your skin from head to toe regularly.  Tell your health care provider about any new moles or changes in moles, especially if:  There is a change in a mole's size, shape, or color.  You have a mole that is larger than a pencil eraser.  Always use sunscreen. Apply sunscreen liberally and  repeat throughout the day.  Protect yourself by wearing long sleeves, pants, a wide-brimmed hat, and sunglasses when outside. What should I know about heart disease, diabetes, and high blood pressure?  If you are 14-71 years of age, have your blood pressure checked every 3-5 years. If you are 58 years of age or older, have your blood pressure checked every year. You should have your blood pressure measured twice-once when you are at a hospital or clinic, and once when you are not at a hospital or clinic. Record the average of the two measurements. To check your blood pressure when you are not at a hospital or clinic, you can use:  An automated blood pressure machine at a pharmacy.  A home blood pressure monitor.  Talk to your health care provider about your target blood pressure.  If you are between 12-25 years old, ask your health care provider if you should take aspirin to prevent heart disease.  Have regular diabetes screenings by checking your fasting blood sugar level.  If you are at a normal weight and have a  low risk for diabetes, have this test once every three years after the age of 66.  If you are overweight and have a high risk for diabetes, consider being tested at a younger age or more often.  A one-time screening for abdominal aortic aneurysm (AAA) by ultrasound is recommended for men aged 98-75 years who are current or former smokers. What should I know about preventing infection? Hepatitis B  If you have a higher risk for hepatitis B, you should be screened for this virus. Talk with your health care provider to find out if you are at risk for hepatitis B infection. Hepatitis C  Blood testing is recommended for:  Everyone born from [birth year] through 1963.  Anyone with known risk factors for hepatitis C. Sexually Transmitted Diseases (STDs)  You should be screened each year for STDs including gonorrhea and chlamydia if:  You are sexually active and are younger than 81  years of age.  You are older than 81 years of age and your health care provider tells you that you are at risk for this type of infection.  Your sexual activity has changed since you were last screened and you are at an increased risk for chlamydia or gonorrhea. Ask your health care provider if you are at risk.  Talk with your health care provider about whether you are at high risk of being infected with HIV. Your health care provider may recommend a prescription medicine to help prevent HIV infection. What else can I do?  Schedule regular health, dental, and eye exams.  Stay current with your vaccines (immunizations).  Do not use any tobacco products, such as cigarettes, chewing tobacco, and e-cigarettes. If you need help quitting, ask your health care provider.  Limit alcohol intake to no more than 2 drinks per day. One drink equals 12 ounces of beer, 5 ounces of wine, or 1 ounces of hard liquor.  Do not use street drugs.  Do not share needles.  Ask your health care provider for help if you need support or information about quitting drugs.  Tell your health care provider if you often feel depressed.  Tell your health care provider if you have ever been abused or do not feel safe at home. This information is not intended to replace advice given to you by your health care provider. Make sure you discuss any questions you have with your health care provider. Document Released: 05/26/2008 Document Revised: 07/27/2016 Document Reviewed: 09/01/2015 Elsevier Interactive Patient Education  2017 [place] Prevention in the Home Falls can cause injuries and can affect people from all age groups. There are many simple things that you can do to make your home safe and to help prevent falls. What can I do on the outside of my home?  Regularly repair the edges of walkways and driveways and fix any cracks.  Remove high doorway thresholds.  Trim any shrubbery on the main path  into your home.  Use bright outdoor lighting.  Clear walkways of debris and clutter, including tools and rocks.  Regularly check that handrails are securely fastened and in good repair. Both sides of any steps should have handrails.  Install guardrails along the edges of any raised decks or porches.  Have leaves, snow, and ice cleared regularly.  Use sand or salt on walkways during winter months.  In the garage, clean up any spills right away, including grease or oil spills. What can I do in the bathroom?  Use night lights.  Install grab bars by the toilet and in the tub and shower. Do not use towel bars as grab bars.  Use non-skid mats or decals on the floor of the tub or shower.  If you need to sit down while you are in the shower, use a plastic, non-slip stool.  Keep the floor dry. Immediately clean up any water that spills on the floor.  Remove soap buildup in the tub or shower on a regular basis.  Attach bath mats securely with double-sided non-slip rug tape.  Remove throw rugs and other tripping hazards from the floor. What can I do in the bedroom?  Use night lights.  Make sure that a bedside light is easy to reach.  Do not use oversized bedding that drapes onto the floor.  Have a firm chair that has side arms to use for getting dressed.  Remove throw rugs and other tripping hazards from the floor. What can I do in the kitchen?  Clean up any spills right away.  Avoid walking on wet floors.  Place frequently used items in easy-to-reach places.  If you need to reach for something above you, use a sturdy step stool that has a grab bar.  Keep electrical cables out of the way.  Do not use floor polish or wax that makes floors slippery. If you have to use wax, make sure that it is non-skid floor wax.  Remove throw rugs and other tripping hazards from the floor. What can I do in the stairways?  Do not leave any items on the stairs.  Make sure that there  are handrails on both sides of the stairs. Fix handrails that are broken or loose. Make sure that handrails are as long as the stairways.  Check any carpeting to make sure that it is firmly attached to the stairs. Fix any carpet that is loose or worn.  Avoid having throw rugs at the top or bottom of stairways, or secure the rugs with carpet tape to prevent them from moving.  Make sure that you have a light switch at the top of the stairs and the bottom of the stairs. If you do not have them, have them installed. What are some other fall prevention tips?  Wear closed-toe shoes that fit well and support your feet. Wear shoes that have rubber soles or low heels.  When you use a stepladder, make sure that it is completely opened and that the sides are firmly locked. Have someone hold the ladder while you are using it. Do not climb a closed stepladder.  Add color or contrast paint or tape to grab bars and handrails in your home. Place contrasting color strips on the first and last steps.  Use mobility aids as needed, such as canes, walkers, scooters, and crutches.  Turn on lights if it is dark. Replace any light bulbs that burn out.  Set up furniture so that there are clear paths. Keep the furniture in the same spot.  Fix any uneven floor surfaces.  Choose a carpet design that does not hide the edge of steps of a stairway.  Be aware of any and all pets.  Review your medicines with your healthcare provider. Some medicines can cause dizziness or changes in blood pressure, which increase your risk of falling. Talk with your health care provider about other ways that you can decrease your risk of falls. This may include working with a physical therapist or trainer to improve your strength, balance, and endurance. This information  is not intended to replace advice given to you by your health care provider. Make sure you discuss any questions you have with your health care provider. Document  Released: 11/18/2002 Document Revised: 04/26/2016 Document Reviewed: 01/02/2015 Elsevier Interactive Patient Education  2017 [employer].

## 2017-04-20 NOTE — Assessment & Plan Note (Signed)
S:Down 2 more lbs. Wife tightening diet at home for him- he would prefer more liberal A/P: update a1c at their request

## 2017-04-20 NOTE — Assessment & Plan Note (Addendum)
S: controlled on lasix 40mg  BID.  BP Readings from Last 3 Encounters:  04/20/17 136/72  03/22/17 (!) 194/92  01/30/17 120/68  A/P:Continue current meds:  Unclear why had higher #s at rehab but much improved today.  Discussed mobic risks- to heart and BP potentially but feels like helping his joint great deal so will continue

## 2017-04-20 NOTE — Progress Notes (Signed)
Subjective:   Walter Gill is a 83 y.o. male who presents for Medicare Annual/Subsequent preventive examination.  HRA assessment completed during this visit with Walter Gill   The Patient was informed that the wellness visit is to identify future health risk and educate and initiate measures that can reduce risk for increased disease through the lifespan.    NO ROS; Medicare Wellness Visit Last OV:  was seen 12/2016  Labs completed: to be drawn today   Describes health as fair, good or great? 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 6 grands; one step-grandson Plays the drums; sings in chorus   Update:  Tobacco: Never smoked  2nd Hand Smoke no Chew or electronic cigarettes ETOH: none   Medications reviewed by MD  BMI: 36   Diet;   Has a couple of graham crackers at hs Cutting back on carbs Cooks with healthy oil  Breakfast; cheerios; 2 slices and peanut butter on bread Lunch; omelette; with vegetables;  Low salt ham and cheese or Tomato sandwich Supper; started making salads with spinach  Sliced apple and carrots  bk olives; tomato   Issues with teeth no  Exercise;   PT helped him to develop a program He can't stand any length of time Walking long distance makes his knees weak Dr. Naaman Plummer is working on his knees but exercise is limited  Has a Rolling walker that converts to a chair  Dystonia on the right secondary to parkinson Wife helps him to take his medicine on time which helps  Discussed mobility resources Did have long term health care but now run by the state  Given resources for d/a online "sale" items in [place]  Discussed getting POW at some point and how this may enrich his life Not sure home is big enough to accommodate    Safety features reviewed for safe  community;  Does have shower chair and hand held shower but is very careful  Hobbies Enjoys playing drums;  Wife sings;   Wife  keeps a big calendar and manages Mr. Pfefferle meds She is very organized; keeps his meds in small bag with time he is to take them. Is adamant about taking his parkinson's meds on time   Advanced Directive yes they have completed and will try to bring a copy to the office   Hearing Screening Comments: No hearing issues that interfere with daily life  Vision Screening Comments: Once a year My eye doctor on [place];  Fall hx; not this year but he is very careful Dr. Naaman Plummer assisting with medical plan to increase his independence  (per the note, it would appear he has had a fall)   Given education on "Fall Prevention in the Home" for more safety tips the patient can apply as appropriate.  Long term goal is to "age in place"    Mental Health:  Any emotional problems? Anxious, depressed, irritable, sad or blue? no Denies feeling depressed or hopeless; voices pleasure in daily life How many social activities have you been engaged in within the last 2 weeks? no   Pain: in knees when up and back of right knee  Cognitive;  Manages checkbook, medications; no failures of task Ad8 score reviewed for issues;  Issues making decisions; no  Less interest in hobbies / activities" no  Repeats questions, stories; family complaining: NO  Trouble using ordinary gadgets; microwave; computer: no  Forgets the month or year: no  Mismanaging finances: no  Missing apt: no but does write them down  Daily problems with thinking of memory NO Ad8 score is 0   Any dizziness when standing up? Stands a minutes prior to moving when getting up from a chair  Mobilization and Functional losses from last year to this year? He is really not sure, as he loses some function but happens very gradually    Health Maintenance/No overdue screens Colonoscopy;  08/1999 - aged out Prostate cancer screening: deferred  Immunizations Due: (Vaccines reviewed and educated regarding any overdue)    Patient Care Team: Marin Olp, MD as PCP - General (Family Medicine) Dr. Alonza Bogus Neurology Dr Alger Simons Phys med;         Objective:    Vitals: BP 136/72   Pulse 68   Temp 97.7 F (36.5 C) (Oral)   Ht 5\' 8"  (1.727 m)   Wt 238 lb 6.4 oz (108.1 kg)   SpO2 98%   BMI 36.25 kg/m   Body mass index is 36.25 kg/m.  Tobacco History  Smoking Status  . Never Smoker  Smokeless Tobacco  . Never Used     Counseling given: Yes   Past Medical History:  Diagnosis Date  . DVT (deep venous thrombosis) ([place])   . Gout   . Hyperlipidemia   . Hypertension   . HYPERTENSION 07/02/2007   Qualifier: Diagnosis of  By: Leanne Chang MD, Bruce    . Left ventricular dysfunction    impaired LV relaxation  . OA (osteoarthritis)   . Parkinson disease ([place])   . PE (pulmonary embolism) september 2008  . RBBB (right bundle branch block)   . Sleep apnea    Past Surgical History:  Procedure Laterality Date  . APPENDECTOMY    . CATARACT EXTRACTION     bilateral  . CHOLECYSTECTOMY    . dupruytens contracture     left  . EYE SURGERY     ? for lid lag vs apraxia of eye opening  . pilonidal cyst removal    . TOTAL HIP ARTHROPLASTY  9/8/8   right   Family History  Problem Relation Age of Onset  . Heart disease Mother        had murmur, never went to doctor  . Cancer Father        possible gi   History  Sexual Activity  . Sexual activity: Not on file    Outpatient Encounter Prescriptions as of 04/20/2017  Medication Sig  . acetaminophen (TYLENOL) 650 MG CR tablet Take two 650 mg Tabs twice daily for pain  . allopurinol (ZYLOPRIM) 100 MG tablet Take 1 tablet (100 mg total) by mouth daily.  Marland Kitchen aspirin 81 MG chewable tablet Chew 81 mg by mouth at bedtime.   . bisacodyl (DULCOLAX) 5 MG EC tablet Take 2 tablets (10 mg total) by mouth every  morning.  . carbidopa-levodopa (SINEMET CR) 50-200 MG tablet TAKE 1 TABLET 5 TIMES A DAY  . cholecalciferol (VITAMIN D) 1000 units tablet Take 1,000 Units by mouth daily.  . furosemide (LASIX) 40 MG tablet TAKE 1 TABLET BY MOUTH TWICE A DAY  . Magnesium Oxide 500 MG TABS Take 500 mg by mouth 2 (two) times daily.  . meloxicam (MOBIC) 7.5 MG tablet Take 1 tablet (7.5  mg total) by mouth daily.  . Mirabegron ER (MYRBETRIQ) 25 MG TB24 Take 25 mg by mouth 2 (two) times daily.   . nitrofurantoin, macrocrystal-monohydrate, (MACROBID) 100 MG capsule Take 100 mg by mouth at bedtime.   . polyethylene glycol (MIRALAX / GLYCOLAX) packet Take 17 g by mouth daily.  . potassium chloride SA (KLOR-CON M20) 20 MEQ tablet Take 1 tablet (20 mEq total) by mouth 2 (two) times daily.  . rosuvastatin (CRESTOR) 10 MG tablet TAKE 1 TABLET DAILY  . selegiline (ELDEPRYL) 5 MG capsule Take 5 mg by mouth 2 (two) times daily.  . tamsulosin (FLOMAX) 0.4 MG CAPS capsule TAKE 1 CAPSULE DAILY   No facility-administered encounter medications on file as of 04/20/2017.     Activities of Daily Living In your present state of health, do you have any difficulty performing the following activities: 04/20/2017  Hearing? N  Vision? N  Difficulty concentrating or making decisions? N  Walking or climbing stairs? Y  Dressing or bathing? N  Doing errands, shopping? Y  Preparing Food and eating ? Y  Using the Toilet? Y  In the past six months, have you accidently leaked urine? Y  Do you have problems with loss of bowel control? N  Managing your Medications? N  Managing your Finances? N  Housekeeping or managing your Housekeeping? N  Some recent data might be hidden    Patient Care Team: Marin Olp, MD as PCP - General (Family Medicine)   Assessment:     Exercise Activities and Dietary recommendations    Goals    . patient          To maintain and hopefully MD can help with this       Fall Risk Fall Risk   04/20/2017 03/22/2017 01/30/2017 08/25/2016 03/17/2016  Falls in the past year? No Yes Yes Yes No  Number falls in past yr: - 1 - 1 -  Injury with Fall? - No - No -  Risk for fall due to : - - - - -  Follow up - - - Falls evaluation completed -   Depression Screen PHQ 2/9 Scores 04/20/2017 03/22/2017 03/17/2016 01/14/2015  PHQ - 2 Score 0 2 0 0  PHQ- 9 Score - 7 - -    Cognitive Function; states his recall is not good but no issues with daily living; wife assist with daily task as needed         Immunization History  Administered Date(s) Administered  . Influenza Split 09/11/2013  . Influenza Whole 09/11/2006, 09/26/2007, 09/22/2008, 09/11/2012  . Influenza,inj,Quad PF,36+ Mos 09/09/2015  . Influenza-Unspecified 09/15/2014, 09/10/2016  . Pneumococcal Conjugate-13 12/16/2015  . Pneumococcal Polysaccharide-23 09/11/2001  . Tdap 05/05/2011   Screening Tests Health Maintenance  Topic Date Due  . INFLUENZA VACCINE  07/12/2017  . TETANUS/TDAP  05/04/2021  . PNA vac Low Risk Adult  Completed      Plan:      PCP Notes  Health Maintenance No HM due  Discussed resources in the community;  Community housing solutions to assist with installing grab bars Online disability site for used equipment in [place]  ADC which he may enjoy while wife is out running errands.  Number to call and go on waiting list for the [facility]; (may be 2 years) which provides SNF care in the home.   To bring a copy of HCPOA and LW   Abnormal Screens no  Referrals no  Patient concerns; no; wife is monitoring his  sugar intake; states he is more alert since he has slowed down on sugar.   Nurse Concerns; Very engaged and enjoys their outside hobby;  Fighting to stay mobile; discussed POW if this is needed in the future and benefits;   Wife manages his meds;   Next PCP apt- seen today    I have personally reviewed and noted the following in the patient's chart:   . Medical and social history . Use of alcohol,  tobacco or illicit drugs  . Current medications and supplements . Functional ability and status . Nutritional status . Physical activity . Advanced directives . List of other physicians . Hospitalizations, surgeries, and ER visits in previous 12 months . Vitals . Screenings to include cognitive, depression, and falls . Referrals and appointments  In addition, I have reviewed and discussed with patient certain preventive protocols, quality metrics, and best practice recommendations. A written personalized care plan for preventive services as well as general preventive health recommendations were provided to patient.     XMIWO,EHOZY, RN  04/20/2017  I have reviewed and agree with note, evaluation, plan.   [name], MD

## 2017-04-20 NOTE — Assessment & Plan Note (Signed)
S: weight down 2 lbs. No respiratory distress or crackles. Compliant with lasix. Severe edema A/P: using old compression stockings- encouraged to use the newer ones he has but harder to get on- I think this is likely venous stasis and not his diastolic HF- that seems to be relatively stable. Continue lasix 40mg  BID

## 2017-04-20 NOTE — Progress Notes (Signed)
Subjective:  Walter Gill is a 83 y.o. year old very pleasant male patient who presents for/with See problem oriented charting ROS- no chest pain or shortness of breath. No falls. Walks with walker.    Past Medical History-  Patient Active Problem List   Diagnosis Date Noted  . Spinal stenosis of lumbar region 08/01/2015    Priority: High  . Chronic diastolic heart failure ([place]) 10/29/2014    Priority: High  . Chronic pain syndrome 10/01/2014    Priority: High  . PARKINSON'S DISEASE 07/02/2007    Priority: High  . Hyperglycemia 03/17/2016    Priority: Medium  . Urinary incontinence 01/14/2015    Priority: Medium  . Hyperlipidemia 10/29/2014    Priority: Medium  . Constipation 10/24/2014    Priority: Medium  . OSA on CPAP 04/17/2013    Priority: Medium  . DVT, HX OF 10/16/2007    Priority: Medium  . Gout 07/02/2007    Priority: Medium  . Essential hypertension 07/02/2007    Priority: Medium  . B12 deficiency 06/06/2013    Priority: Low  . REM behavioral disorder 10/25/2012    Priority: Low  . PULMONARY EMBOLISM 10/08/2007    Priority: Low  . RBBB 07/02/2007    Priority: Low  . Primary localized osteoarthritis of right knee 07/02/2007    Priority: Low  . Spondylosis of lumbar region without myelopathy or radiculopathy 03/22/2017  . Posture abnormality 03/22/2017    Medications- reviewed and updated Current Outpatient Prescriptions  Medication Sig Dispense Refill  . acetaminophen (TYLENOL) 650 MG CR tablet Take two 650 mg Tabs twice daily for pain 60 tablet 0  . allopurinol (ZYLOPRIM) 100 MG tablet Take 1 tablet (100 mg total) by mouth daily. 90 tablet 0  . aspirin 81 MG chewable tablet Chew 81 mg by mouth at bedtime.     . bisacodyl (DULCOLAX) 5 MG EC tablet Take 2 tablets (10 mg total) by mouth every morning. 30 tablet 0  . carbidopa-levodopa (SINEMET CR) 50-200 MG tablet TAKE 1 TABLET 5 TIMES A DAY 450 tablet 1  . cholecalciferol (VITAMIN D) 1000 units tablet  Take 1,000 Units by mouth daily.    . furosemide (LASIX) 40 MG tablet TAKE 1 TABLET BY MOUTH TWICE A DAY 180 tablet 3  . Magnesium Oxide 500 MG TABS Take 500 mg by mouth 2 (two) times daily.    . meloxicam (MOBIC) 7.5 MG tablet Take 1 tablet (7.5 mg total) by mouth daily. 30 tablet 4  . Mirabegron ER (MYRBETRIQ) 25 MG TB24 Take 25 mg by mouth 2 (two) times daily.     . nitrofurantoin, macrocrystal-monohydrate, (MACROBID) 100 MG capsule Take 100 mg by mouth at bedtime.     . polyethylene glycol (MIRALAX / GLYCOLAX) packet Take 17 g by mouth daily. 14 each 0  . potassium chloride SA (KLOR-CON M20) 20 MEQ tablet Take 1 tablet (20 mEq total) by mouth 2 (two) times daily. 180 tablet 2  . rosuvastatin (CRESTOR) 10 MG tablet TAKE 1 TABLET DAILY 90 tablet 3  . selegiline (ELDEPRYL) 5 MG capsule Take 5 mg by mouth 2 (two) times daily.    . tamsulosin (FLOMAX) 0.4 MG CAPS capsule TAKE 1 CAPSULE DAILY 90 capsule 3   No current facility-administered medications for this visit.     Objective: BP 136/72   Pulse 68   Temp 97.7 F (36.5 C) (Oral)   Wt 238 lb 6.4 oz (108.1 kg)   SpO2 98%   BMI 34.21  kg/m  Gen: NAD, resting comfortably CV: RRR no murmurs rubs or gallops Lungs: CTAB no crackles, wheeze, rhonchi Abdomen: soft/nontender/nondistended/normal bowel sounds. No rebound or guarding.  Ext: 2+  Edema. Compression stockings noted but clearly old/not effective Skin: warm, dry Neuro: leans toward the right- baseline, walks with walker  Assessment/Plan:   Chronic diastolic heart failure S: weight down 2 lbs. No respiratory distress or crackles. Compliant with lasix. Severe edema A/P: using old compression stockings- encouraged to use the newer ones he has but harder to get on- I think this is likely venous stasis and not his diastolic HF- that seems to be relatively stable. Continue lasix 40mg  BID   Essential hypertension S: controlled on lasix 40mg  BID.  BP Readings from Last 3 Encounters:   04/20/17 136/72  03/22/17 (!) 194/92  01/30/17 120/68  A/P:Continue current meds:  Unclear why had higher #s at rehab but much improved today.  Discussed mobic risks- to heart and BP potentially but feels like helping his joint great deal so will continue   Hyperglycemia S:Down 2 more lbs. Wife tightening diet at home for him- he would prefer more liberal A/P: update a1c at their request  4 months with wife  Mild anemia last visit- update again today Orders Placed This Encounter  Procedures  . CBC    [place]  . Basic metabolic panel    [place]  . Hemoglobin A1c    [place]  . Uric Acid   Return precautions advised.  [name], MD

## 2017-04-21 ENCOUNTER — Telehealth: Payer: Self-pay | Admitting: Family Medicine

## 2017-04-21 NOTE — Telephone Encounter (Signed)
Pt returning your call

## 2017-04-26 ENCOUNTER — Encounter: Payer: Self-pay | Admitting: Physical Medicine & Rehabilitation

## 2017-04-26 ENCOUNTER — Encounter: Payer: Medicare Other | Attending: Physical Medicine & Rehabilitation | Admitting: Physical Medicine & Rehabilitation

## 2017-04-26 VITALS — BP 162/82 | HR 75

## 2017-04-26 DIAGNOSIS — I10 Essential (primary) hypertension: Secondary | ICD-10-CM | POA: Insufficient documentation

## 2017-04-26 DIAGNOSIS — Z96641 Presence of right artificial hip joint: Secondary | ICD-10-CM | POA: Diagnosis not present

## 2017-04-26 DIAGNOSIS — Z9842 Cataract extraction status, left eye: Secondary | ICD-10-CM | POA: Diagnosis not present

## 2017-04-26 DIAGNOSIS — Z809 Family history of malignant neoplasm, unspecified: Secondary | ICD-10-CM | POA: Insufficient documentation

## 2017-04-26 DIAGNOSIS — Z9841 Cataract extraction status, right eye: Secondary | ICD-10-CM | POA: Insufficient documentation

## 2017-04-26 DIAGNOSIS — G20 Parkinson's disease: Secondary | ICD-10-CM | POA: Diagnosis not present

## 2017-04-26 DIAGNOSIS — M109 Gout, unspecified: Secondary | ICD-10-CM | POA: Diagnosis not present

## 2017-04-26 DIAGNOSIS — G4730 Sleep apnea, unspecified: Secondary | ICD-10-CM | POA: Diagnosis not present

## 2017-04-26 DIAGNOSIS — R293 Abnormal posture: Secondary | ICD-10-CM | POA: Diagnosis not present

## 2017-04-26 DIAGNOSIS — M48061 Spinal stenosis, lumbar region without neurogenic claudication: Secondary | ICD-10-CM | POA: Diagnosis not present

## 2017-04-26 DIAGNOSIS — M47816 Spondylosis without myelopathy or radiculopathy, lumbar region: Secondary | ICD-10-CM

## 2017-04-26 DIAGNOSIS — E785 Hyperlipidemia, unspecified: Secondary | ICD-10-CM | POA: Insufficient documentation

## 2017-04-26 DIAGNOSIS — Z9049 Acquired absence of other specified parts of digestive tract: Secondary | ICD-10-CM | POA: Diagnosis not present

## 2017-04-26 DIAGNOSIS — M1711 Unilateral primary osteoarthritis, right knee: Secondary | ICD-10-CM | POA: Insufficient documentation

## 2017-04-26 DIAGNOSIS — Z8249 Family history of ischemic heart disease and other diseases of the circulatory system: Secondary | ICD-10-CM | POA: Diagnosis not present

## 2017-04-26 DIAGNOSIS — Z86718 Personal history of other venous thrombosis and embolism: Secondary | ICD-10-CM | POA: Diagnosis not present

## 2017-04-26 NOTE — Patient Instructions (Signed)
PLEASE FEEL FREE TO CALL OUR OFFICE WITH ANY PROBLEMS OR QUESTIONS ([phone])    SUPPLEMENTS USEFUL FOR OSTEOARTHRITIS: OMEGA 3 FATTY ACIDS, TURMERIC, GINGER, TART CHERRY EXTRACT, CELERY SEED, GLUCOSAMINE WITH CHONDROITIN     

## 2017-04-26 NOTE — Progress Notes (Signed)
Subjective:    Patient ID: Walter Gill, male    DOB: Jun 19, 1934, 83 y.o.   MRN: [id number]  HPI   Walter Gill is here in follow up of his gait abnormality and associated pain. I sent him for an xray which I reviewed in person today with him and his wife. Results were as follows:  Moderate to severe osteoarthritic change centered on the lateral joint compartment. Milder changes are noted of the medial and patellofemoral compartments. There is no acute bony abnormality.  He had some relief with meloxicam and has had no tolerance issues. He is trying the knee strengthening exercises I provided. He has not used the supplements as his wife did not remember that I had provided them to him.  He reports no changes with his Parkinson's symptoms. He continues to use his walker for gait. He hasn't had any falls.    Pain Inventory Average Pain 8 Pain Right Now 0 My pain is dull and aching  In the last 24 hours, has pain interfered with the following? General activity 6 Relation with others 6 Enjoyment of life 0 What TIME of day is your pain at its worst? daytime Sleep (in general) Fair  Pain is worse with: walking and standing Pain improves with: rest, heat/ice and medication Relief from Meds: .  Mobility walk with assistance use a walker do you drive?  no  Function retired I need assistance with the following:  meal prep, household duties and shopping  Neuro/Psych bladder control problems bowel control problems numbness trouble walking anxiety loss of taste or smell  Prior Studies Any changes since last visit?  no  Physicians involved in your care Any changes since last visit?  no   Family History  Problem Relation Age of Onset  . Heart disease Mother        had murmur, never went to doctor  . Cancer Father        possible gi   Social History   Social History  . Marital status: Married    Spouse name: N/A  . Number of children: N/A  . Years of education:  N/A   Occupational History  . 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 Smoking status: Never Smoker  . Smokeless tobacco: Never Used  . Alcohol use No  . Drug use: No  . Sexual activity: Not on file   Other Topics Concern  . Not on file   Social History Narrative   Married. 3 children. 6 grandkids and 1 stepgrandson. No greatgrandkids.       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 [place] [employer]. [employer].          Past Surgical History:  Procedure Laterality Date  . APPENDECTOMY    . CATARACT EXTRACTION     bilateral  . CHOLECYSTECTOMY    . dupruytens contracture     left  . EYE SURGERY     ? for lid lag vs apraxia of eye opening  . pilonidal cyst removal    . TOTAL HIP ARTHROPLASTY  9/8/8   right   Past Medical History:  Diagnosis Date  . DVT (deep venous thrombosis) ([place])   . Gout   . Hyperlipidemia   . Hypertension   . HYPERTENSION 07/02/2007   Qualifier: Diagnosis of  By: Leanne Chang MD, Bruce    . Left ventricular  dysfunction    impaired LV relaxation  . OA (osteoarthritis)   . Parkinson disease ([place])   . PE (pulmonary embolism) september 2008  . RBBB (right bundle branch block)   . Sleep apnea    There were no vitals taken for this visit.  Opioid Risk Score:   Fall Risk Score:  `1  Depression screen PHQ 2/9  Depression screen [facility] 2/9 04/20/2017 03/22/2017 03/17/2016 01/14/2015 04/17/2013  Decreased Interest 0 0 0 0 0  Down, Depressed, Hopeless 0 2 0 0 0  PHQ - 2 Score 0 2 0 0 0  Altered sleeping - 0 - - -  Tired, decreased energy - 1 - - -  Change in appetite - 0 - - -  Feeling bad or failure about yourself  - 1 - - -  Trouble concentrating - 0 - - -  Moving slowly or fidgety/restless - 3 - - -  Suicidal thoughts - 0 - - -  PHQ-9 Score - 7 - - -  Difficult doing work/chores - Somewhat difficult - - -  Some recent data might be hidden    Review of  Systems  Constitutional: Positive for unexpected weight change.  HENT: Negative.   Eyes: Negative.   Respiratory: Positive for apnea.   Cardiovascular: Negative.   Gastrointestinal: Positive for constipation.  Endocrine: Negative.   Genitourinary: Negative.   Musculoskeletal: Positive for joint swelling.  Skin: Negative.   Allergic/Immunologic: Negative.   Neurological: Negative.   Hematological: Negative.   Psychiatric/Behavioral: Negative.   All other systems reviewed and are negative.      Objective:   Physical Exam  General: Alert and oriented x 3, No apparent distress. obese HEENT: Head is normocephalic, atraumatic, PERRLA, EOMI, sclera anicteric, oral mucosa pink and moist, dentition intact, ext ear canals clear,  Neck: Supple without JVD or lymphadenopathy Heart: RRR   Chest:  CTA B. Normal effort,   Abdomen: Soft, non-tender, non-distended, bowel sounds positive. Extremities: No clubbing, cyanosis, or edema. Pulses are 2+ Skin: Clean and intact without signs of breakdown Neuro: Pt is cognitively appropriate with normal insight, memory, and awareness. Cranial nerves 2-12 are intact. ?decreased LT in median distribution of either hands. . Reflexes are tr to absent in all 4's. Fine motor coordination is decreased. Periodic resting tremor is noted as well as occasional jerking movements of left arm. Does have some mild resting rigidity in both legs and arms. Motor function is grossly 4-5/5 in the upper extremities and 3+ to 4/5 HF, RKE 4-/5, LKE 4/5. ADF/PF 4-5/5 bilaterally Motor function is grossly 5/5. There is pain inhibition with use of RLE. Right knee "tight". Neuro exam unchanged Musculoskeletal: has difficulty achieivng neutral extension in standing. He leans to the right at an approximate 20 degree angle and is flexed forward nearly 25-30 degrees. At the same time his pelvis is tilted to the right, his right knee is flexed at 20 degrees and there is a valgus deformity of  the right knee as well. THIS IS UNCHANGED TODAY.  Right knee cannot be extended fully.  Has some tightness of the right biceps femoris. Right knee is sclerosed and has crepitus with AROM/PROM. There is a mild compensatory leftward curve of the lumbar spine but it is generally fairly neutral along the angle of his pelvic/trunk bend. There is generalized muscle spasm in the right lumbar paraspinals but he was not tender to palpation in this area. PSIS non-tender, greater trochs non-tender. He ambulates with reasonable leg swing. I  did not see a shuffling gait pattern. His valgus deformity increases with standing and there is definite antalgia with weight bearing on the right side.  Psych: Pt's affect is appropriate. Pt is cooperative and quite pleasant        Assessment & Plan:   1. Parkinson's disease 2. 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  There is certainly underlying tightness in core muscle mechanism although this is not true muscle spasticity 3. Endstage OA of right knee as above (lateral compartment most affected)  Plan: 1. After informed consent and preparation of the skin with betadine and isopropyl alcohol, I injected 6mg  (1cc) of celestone and 4cc of 1% lidocaine into the right knee via anterolateral approach. Additionally, aspiration was performed prior to injection. The patient tolerated well, and no complications were encountered. Afterward the area was cleaned and dressed. Post- injection instructions were provided.  Additionally, I again provided he and his wife a list of supplements he could try to help with joint pain.  Bracing could be an option as well moving forward He will continue with Knee ROM and strengthening program I provided 2. Will perform botox injection  (100u) to the right hamstring, (likely biceps femoris) to help facilitate better knee and back ROM at next visit pending response to joint injection  4. Continue low dose meloxicam, 7.5mg  daily with food. So far there are no tolerance issues. 5. I do think working with a physical therapist more familiar in low back/posture would be of use, possibly focusing on pilates based principles to help improve his trunk strength and posture. Consider referral depending upon presentation at next visit. 6. Again, this a multifactorial problem and that it will require a holistic approach if he hopes to see any improvement.      I will see him back in about a month potential botox. . Fifteen minutes of face to face patient care time were spent during this visit. All questions were encouraged and answered.   Greater than 50% of time was spent in direct consultation regarding the right knee, review of xray, treatment options, etc   Meredith Staggers, MD, [facility] 03/22/2017 .

## 2017-04-27 NOTE — Telephone Encounter (Signed)
Spoke with patient regarding labs.

## 2017-05-04 ENCOUNTER — Ambulatory Visit (INDEPENDENT_AMBULATORY_CARE_PROVIDER_SITE_OTHER): Payer: Medicare Other | Admitting: Neurology

## 2017-05-04 DIAGNOSIS — E538 Deficiency of other specified B group vitamins: Secondary | ICD-10-CM | POA: Diagnosis not present

## 2017-05-04 MED ORDER — CYANOCOBALAMIN 1000 MCG/ML IJ SOLN
1000.0000 ug | Freq: Once | INTRAMUSCULAR | Status: AC
  Administered 2017-05-04: 1000 ug via INTRAMUSCULAR

## 2017-05-22 ENCOUNTER — Encounter: Payer: Medicare Other | Admitting: Physical Medicine & Rehabilitation

## 2017-05-31 ENCOUNTER — Encounter: Payer: Self-pay | Admitting: Physical Medicine & Rehabilitation

## 2017-05-31 ENCOUNTER — Encounter: Payer: Medicare Other | Attending: Physical Medicine & Rehabilitation | Admitting: Physical Medicine & Rehabilitation

## 2017-05-31 DIAGNOSIS — M1711 Unilateral primary osteoarthritis, right knee: Secondary | ICD-10-CM | POA: Insufficient documentation

## 2017-05-31 DIAGNOSIS — M48061 Spinal stenosis, lumbar region without neurogenic claudication: Secondary | ICD-10-CM | POA: Diagnosis not present

## 2017-05-31 DIAGNOSIS — M109 Gout, unspecified: Secondary | ICD-10-CM | POA: Diagnosis not present

## 2017-05-31 DIAGNOSIS — Z9841 Cataract extraction status, right eye: Secondary | ICD-10-CM | POA: Diagnosis not present

## 2017-05-31 DIAGNOSIS — G20 Parkinson's disease: Secondary | ICD-10-CM | POA: Diagnosis not present

## 2017-05-31 DIAGNOSIS — R293 Abnormal posture: Secondary | ICD-10-CM | POA: Diagnosis not present

## 2017-05-31 DIAGNOSIS — Z809 Family history of malignant neoplasm, unspecified: Secondary | ICD-10-CM | POA: Diagnosis not present

## 2017-05-31 DIAGNOSIS — Z9049 Acquired absence of other specified parts of digestive tract: Secondary | ICD-10-CM | POA: Insufficient documentation

## 2017-05-31 DIAGNOSIS — E785 Hyperlipidemia, unspecified: Secondary | ICD-10-CM | POA: Diagnosis not present

## 2017-05-31 DIAGNOSIS — M47816 Spondylosis without myelopathy or radiculopathy, lumbar region: Secondary | ICD-10-CM | POA: Diagnosis not present

## 2017-05-31 DIAGNOSIS — I10 Essential (primary) hypertension: Secondary | ICD-10-CM | POA: Diagnosis not present

## 2017-05-31 DIAGNOSIS — Z8249 Family history of ischemic heart disease and other diseases of the circulatory system: Secondary | ICD-10-CM | POA: Diagnosis not present

## 2017-05-31 DIAGNOSIS — G8110 Spastic hemiplegia affecting unspecified side: Secondary | ICD-10-CM | POA: Insufficient documentation

## 2017-05-31 DIAGNOSIS — Z9842 Cataract extraction status, left eye: Secondary | ICD-10-CM | POA: Insufficient documentation

## 2017-05-31 DIAGNOSIS — G4730 Sleep apnea, unspecified: Secondary | ICD-10-CM | POA: Diagnosis not present

## 2017-05-31 DIAGNOSIS — Z96641 Presence of right artificial hip joint: Secondary | ICD-10-CM | POA: Insufficient documentation

## 2017-05-31 DIAGNOSIS — Z86718 Personal history of other venous thrombosis and embolism: Secondary | ICD-10-CM | POA: Insufficient documentation

## 2017-05-31 NOTE — Patient Instructions (Signed)
CONTINUE WITH YOUR STRETCHES

## 2017-05-31 NOTE — Progress Notes (Signed)
Botox Injection for spasticity using needle EMG guidance Indication: Spastic hemiparesis affecting dominant side (HCC)   Dilution: 100 Units/ml        Total Units Injected: 100 Indication: Severe spasticity which interferes with ADL,mobility and/or  hygiene and is unresponsive to medication management and other conservative care Informed consent was obtained after describing risks and benefits of the procedure with the patient. This includes bleeding, bruising, infection, excessive weakness, or medication side effects. A REMS form is on file and signed.  Needle: 78mm injectable monopolar needle electrode  Number of units per muscle  Quadriceps 0 units Gastroc/soleus 0 units Hamstrings 100 units, 50 units biceps femoris and 50 units between semimembranosus/tendinosus Tibialis Posterior 0 units EHL 0 units All injections were done after obtaining appropriate EMG activity and after negative drawback for blood. The patient tolerated the procedure well. Post procedure instructions were given. Return in about 2 months (around 07/31/2017).
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
## 2017-06-29 ENCOUNTER — Encounter: Payer: Self-pay | Admitting: Neurology

## 2017-06-29 ENCOUNTER — Ambulatory Visit (INDEPENDENT_AMBULATORY_CARE_PROVIDER_SITE_OTHER): Payer: Medicare Other | Admitting: Neurology

## 2017-06-29 VITALS — BP 122/80 | HR 86 | Ht 69.0 in | Wt 235.0 lb

## 2017-06-29 DIAGNOSIS — E538 Deficiency of other specified B group vitamins: Secondary | ICD-10-CM | POA: Diagnosis not present

## 2017-06-29 DIAGNOSIS — G20 Parkinson's disease: Secondary | ICD-10-CM

## 2017-06-29 MED ORDER — CYANOCOBALAMIN 1000 MCG/ML IJ SOLN
1000.0000 ug | Freq: Once | INTRAMUSCULAR | Status: AC
  Administered 2017-06-29: 1000 ug via INTRAMUSCULAR

## 2017-07-28 ENCOUNTER — Ambulatory Visit (INDEPENDENT_AMBULATORY_CARE_PROVIDER_SITE_OTHER): Payer: Medicare Other | Admitting: Neurology

## 2017-07-28 DIAGNOSIS — E538 Deficiency of other specified B group vitamins: Secondary | ICD-10-CM

## 2017-07-28 MED ORDER — CYANOCOBALAMIN 1000 MCG/ML IJ SOLN
1000.0000 ug | Freq: Once | INTRAMUSCULAR | Status: AC
  Administered 2017-07-28: 1000 ug via INTRAMUSCULAR

## 2017-07-29 ENCOUNTER — Other Ambulatory Visit: Payer: Self-pay | Admitting: Family Medicine

## 2017-07-29 ENCOUNTER — Other Ambulatory Visit: Payer: Self-pay | Admitting: Neurology

## 2017-08-02 ENCOUNTER — Encounter: Payer: Self-pay | Admitting: Physical Medicine & Rehabilitation

## 2017-08-02 ENCOUNTER — Encounter: Payer: Medicare Other | Attending: Physical Medicine & Rehabilitation | Admitting: Physical Medicine & Rehabilitation

## 2017-08-02 VITALS — BP 152/82 | HR 82

## 2017-08-02 DIAGNOSIS — G20 Parkinson's disease: Secondary | ICD-10-CM | POA: Insufficient documentation

## 2017-08-02 DIAGNOSIS — G8110 Spastic hemiplegia affecting unspecified side: Secondary | ICD-10-CM | POA: Diagnosis not present

## 2017-08-02 DIAGNOSIS — Z9841 Cataract extraction status, right eye: Secondary | ICD-10-CM | POA: Insufficient documentation

## 2017-08-02 DIAGNOSIS — E785 Hyperlipidemia, unspecified: Secondary | ICD-10-CM | POA: Diagnosis not present

## 2017-08-02 DIAGNOSIS — M1711 Unilateral primary osteoarthritis, right knee: Secondary | ICD-10-CM | POA: Diagnosis not present

## 2017-08-02 DIAGNOSIS — M6530 Trigger finger, unspecified finger: Secondary | ICD-10-CM

## 2017-08-02 DIAGNOSIS — M109 Gout, unspecified: Secondary | ICD-10-CM | POA: Diagnosis not present

## 2017-08-02 DIAGNOSIS — G4730 Sleep apnea, unspecified: Secondary | ICD-10-CM | POA: Diagnosis not present

## 2017-08-02 DIAGNOSIS — R293 Abnormal posture: Secondary | ICD-10-CM | POA: Diagnosis not present

## 2017-08-02 DIAGNOSIS — Z9049 Acquired absence of other specified parts of digestive tract: Secondary | ICD-10-CM | POA: Diagnosis not present

## 2017-08-02 DIAGNOSIS — I10 Essential (primary) hypertension: Secondary | ICD-10-CM | POA: Diagnosis not present

## 2017-08-02 DIAGNOSIS — M47816 Spondylosis without myelopathy or radiculopathy, lumbar region: Secondary | ICD-10-CM | POA: Diagnosis not present

## 2017-08-02 DIAGNOSIS — Z9842 Cataract extraction status, left eye: Secondary | ICD-10-CM | POA: Diagnosis not present

## 2017-08-02 DIAGNOSIS — Z86718 Personal history of other venous thrombosis and embolism: Secondary | ICD-10-CM | POA: Diagnosis not present

## 2017-08-02 DIAGNOSIS — Z8249 Family history of ischemic heart disease and other diseases of the circulatory system: Secondary | ICD-10-CM | POA: Diagnosis not present

## 2017-08-02 DIAGNOSIS — Z96641 Presence of right artificial hip joint: Secondary | ICD-10-CM | POA: Insufficient documentation

## 2017-08-02 DIAGNOSIS — M48061 Spinal stenosis, lumbar region without neurogenic claudication: Secondary | ICD-10-CM | POA: Insufficient documentation

## 2017-08-02 DIAGNOSIS — Z809 Family history of malignant neoplasm, unspecified: Secondary | ICD-10-CM | POA: Insufficient documentation

## 2017-08-02 NOTE — Progress Notes (Signed)
Subjective:    Patient ID: Walter Gill, male    DOB: Jun 07, 1934, 83 y.o.   MRN: [id number]  HPI   Walter Gill is here in follow up of his multifactorial gait disorder. We performed botox in June to his right hamstring. He seems to have had good results with these and has noticed only recently that his pain began to recur.  He feels that he's been able to move more freely and that his balance has improved.    He started glucosamine supp which seems to have helped his knee. He is also on turmeric/ginger and tart cherry. He is using walker for balance but is still limited in exercise due to posture/pain/balance.   He has noticed more tingling and occ numbness in the right hand. He has been told he has CTS before. He is also having tightness in his left hand with frequent "catches" noted in the 4th finger. Symptoms are often worse in the morning.   Pain Inventory Average Pain 5 Pain Right Now 5 My pain is sharp and stabbing  In the last 24 hours, has pain interfered with the following? General activity 5 Relation with others 5 Enjoyment of life 5 What TIME of day is your pain at its worst? all Sleep (in general) Good  Pain is worse with: walking and some activites Pain improves with: rest and therapy/exercise Relief from Meds: .  Mobility use a cane use a walker ability to climb steps?  yes do you drive?  no  Function retired  Neuro/Psych numbness tingling  Prior Studies Any changes since last visit?  no  Physicians involved in your care Any changes since last visit?  no   Family History  Problem Relation Age of Onset  . Heart disease Mother        had murmur, never went to doctor  . Cancer Father        possible gi   Social History   Social History  . Marital status: Married    Spouse name: N/A  . Number of children: N/A  . Years of education: N/A   Occupational History  . 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 Smoking status:  Never Smoker  . Smokeless tobacco: Never Used  . Alcohol use No  . Drug use: No  . Sexual activity: Not on file   Other Topics Concern  . Not on file   Social History Narrative   Married. 3 children. 6 grandkids and 1 stepgrandson. No greatgrandkids.       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 [place] [employer]. [employer].          Past Surgical History:  Procedure Laterality Date  . APPENDECTOMY    . CATARACT EXTRACTION     bilateral  . CHOLECYSTECTOMY    . dupruytens contracture     left  . EYE SURGERY     ? for lid lag vs apraxia of eye opening  . pilonidal cyst removal    . TOTAL HIP ARTHROPLASTY  9/8/8   right   Past Medical History:  Diagnosis Date  . DVT (deep venous thrombosis) ([place])   . Gout   . Hyperlipidemia   . Hypertension   . HYPERTENSION 07/02/2007   Qualifier: Diagnosis of  By: Leanne Chang MD, Bruce    . Left ventricular dysfunction    impaired LV  relaxation  . OA (osteoarthritis)   . Parkinson disease ([place])   . PE (pulmonary embolism) september 2008  . RBBB (right bundle branch block)   . Sleep apnea    There were no vitals taken for this visit.  Opioid Risk Score:   Fall Risk Score:  `1  Depression screen PHQ 2/9  Depression screen [facility] 2/9 04/20/2017 03/22/2017 03/17/2016 01/14/2015 04/17/2013  Decreased Interest 0 0 0 0 0  Down, Depressed, Hopeless 0 2 0 0 0  PHQ - 2 Score 0 2 0 0 0  Altered sleeping - 0 - - -  Tired, decreased energy - 1 - - -  Change in appetite - 0 - - -  Feeling bad or failure about yourself  - 1 - - -  Trouble concentrating - 0 - - -  Moving slowly or fidgety/restless - 3 - - -  Suicidal thoughts - 0 - - -  PHQ-9 Score - 7 - - -  Difficult doing work/chores - Somewhat difficult - - -  Some recent data might be hidden     Review of Systems  Constitutional: Negative.   HENT: Negative.   Eyes: Negative.   Respiratory: Negative.     Cardiovascular: Negative.   Gastrointestinal: Negative.   Endocrine: Negative.   Genitourinary: Negative.   Musculoskeletal: Negative.   Skin: Negative.   Allergic/Immunologic: Negative.   Neurological: Negative.   Hematological: Negative.   Psychiatric/Behavioral: Negative.   All other systems reviewed and are negative.      Objective:   Physical Exam  General: Alert and oriented x 3, No apparent distress. obese HEENT:Head is normocephalic, atraumatic, PERRLA, EOMI, sclera anicteric, oral mucosa pink and moist, dentition intact, ext ear canals clear,  Neck:Supple without JVD or lymphadenopathy Heart:RRR  Chest: CTA Bilaterally without wheezes or rales. Normal effort  Abdomen:Soft, non-tender, non-distended, bowel sounds positive. Extremities:No clubbing, cyanosis, or edema. Pulses are 2+ Skin:Clean and intact without signs of breakdown Neuro:Pt is cognitively appropriate with normal insight, memory, and awareness. Cranial nerves 2-12 are intact. ?decreased LT in median distribution of either hands. . Reflexes are tr to absentin all 4's. Fine motor coordination is decreased. Periodic resting tremor is noted as well as occasional jerking movements of left arm. Does have some mild resting rigidity in both legs and arms. Motor function is grossly 4-5/5 in the upper extremities and 3+ to 4/5 HF, RKE 4-/5, LKE 4/5. ADF/PF 4-5/5 bilaterallyMotor function is grossly 5/5. There is pain inhibition with use of RLE. Right knee "tight". NEURO EXAM STABLE.  Musculoskeletal:continues to have difficulty achieivng neutral extension in standing. Leans to right.  his right knee is flexed at 20 degrees and there is a valgus deformity of the right knee as well. .  Right knee can almost be extended fully (-5 degrees).  Has some tightness of the right biceps femoris. Right knee is sclerosed and has crepitus with AROM/PROM. There is a mild compensatory leftward curve of the lumbar spine but it is  generally fairly neutral along the angle of his pelvic/trunk bend. There is generalized muscle spasm in the right lumbar paraspinals but he was not tender to palpation in this area. PSIS non-tender, greater trochs non-tender. He ambulates with reasonable leg swing. I did not see a shuffling gait pattern. His valgus deformity still increases with standing and there is definite antalgia with weight bearing on the right side. +tinel sign right wrist. No obvious contractures/trigger fingers left hand although left 4th MCP tender along palmar  Psych:Pt's affect is appropriate. Pt is cooperative and quite pleasant      ASSESSMENT/PLAN  1. Parkinson's disease 2. 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 There is certainly underlying tightness in core muscle mechanism although this is not true muscle spasticity 3. Endstage OA of right knee as above (lateral compartment most affected) 4. Trigger finger left 4th finger 5. CTS RUE  Plan:  1. He shall continue with Knee ROM and strengthening program as I provided. Aquatic based activities would also be good for him. 2. Will repeat  botox injection (200u) to the right hamstring, (  biceps femoris, primarily) to help facilitate better knee and back ROM at next visit pending response to joint injection  4. Continue low dose meloxicam, 7.5mg  daily with food.   5. Recommend neutral wrist splint RUE at night and possible during the day.  6. Again, this a multifactorial problem and that it will require a holistic approach if he hopes to see any improvement.      I will see him back in about a month botox. .15 minutes of face to face patient care time were spent during this visit. All questions were encouraged and answered. Greater than 50% of time during  this encounter was spent counseling patient/family in regard to supplements, mgt considerations for wrist/fingers. Marland Kitchen

## 2017-08-02 NOTE — Patient Instructions (Signed)
PLEASE FEEL FREE TO CALL OUR OFFICE WITH ANY PROBLEMS OR QUESTIONS ([phone])    WEAR YOUR WRIST SPLINT ON YOUR RIGHT WRIST AT LEAST AT NIGHT AND POSSIBLE DURING THE DAY.

## 2017-08-06 ENCOUNTER — Other Ambulatory Visit: Payer: Self-pay | Admitting: Physical Medicine & Rehabilitation

## 2017-08-06 DIAGNOSIS — M1711 Unilateral primary osteoarthritis, right knee: Secondary | ICD-10-CM

## 2017-08-06 DIAGNOSIS — M47816 Spondylosis without myelopathy or radiculopathy, lumbar region: Secondary | ICD-10-CM

## 2017-08-15 ENCOUNTER — Telehealth: Payer: Self-pay | Admitting: Neurology

## 2017-08-15 DIAGNOSIS — G20 Parkinson's disease: Secondary | ICD-10-CM

## 2017-08-15 NOTE — Telephone Encounter (Signed)
Order entered for power wheelchair evaluation. Tried to call patient's wife back to make her aware with no answer. Order faxed to Andria Rhein with [facility] to [phone] with confirmation received. She will contact them to schedule evaluations.

## 2017-08-15 NOTE — Telephone Encounter (Signed)
Pt's wife called and wanted to proceed with the request for an electric wheelchair for pt with medicare

## 2017-08-24 ENCOUNTER — Encounter: Payer: Self-pay | Admitting: Family Medicine

## 2017-08-24 ENCOUNTER — Ambulatory Visit (INDEPENDENT_AMBULATORY_CARE_PROVIDER_SITE_OTHER): Payer: Medicare Other | Admitting: Family Medicine

## 2017-08-24 DIAGNOSIS — E785 Hyperlipidemia, unspecified: Secondary | ICD-10-CM | POA: Diagnosis not present

## 2017-08-24 DIAGNOSIS — R739 Hyperglycemia, unspecified: Secondary | ICD-10-CM | POA: Diagnosis not present

## 2017-08-24 DIAGNOSIS — I10 Essential (primary) hypertension: Secondary | ICD-10-CM | POA: Diagnosis not present

## 2017-08-24 DIAGNOSIS — I5032 Chronic diastolic (congestive) heart failure: Secondary | ICD-10-CM

## 2017-08-24 NOTE — Progress Notes (Signed)
Subjective:  Walter Gill is a 83 y.o. year old very pleasant male patient who presents for/with See problem oriented charting ROS- no chest pain, stableto slightly improved edema, low back pain noted. No blurry vision.   Past Medical History-  Patient Active Problem List   Diagnosis Date Noted  . Spinal stenosis of lumbar region 08/01/2015    Priority: High  . Chronic diastolic heart failure ([place]) 10/29/2014    Priority: High  . Chronic pain syndrome 10/01/2014    Priority: High  . PARKINSON'S DISEASE 07/02/2007    Priority: High  . Hyperglycemia 03/17/2016    Priority: Medium  . Urinary incontinence 01/14/2015    Priority: Medium  . Hyperlipidemia 10/29/2014    Priority: Medium  . Constipation 10/24/2014    Priority: Medium  . OSA on CPAP 04/17/2013    Priority: Medium  . DVT, HX OF 10/16/2007    Priority: Medium  . Gout 07/02/2007    Priority: Medium  . Essential hypertension 07/02/2007    Priority: Medium  . B12 deficiency 06/06/2013    Priority: Low  . REM behavioral disorder 10/25/2012    Priority: Low  . PULMONARY EMBOLISM 10/08/2007    Priority: Low  . RBBB 07/02/2007    Priority: Low  . Primary localized osteoarthritis of right knee 07/02/2007    Priority: Low  . Trigger finger, acquired 08/02/2017  . Spastic hemiparesis affecting dominant side ([place]) 05/31/2017  . Spondylosis of lumbar region without myelopathy or radiculopathy 03/22/2017  . Posture abnormality 03/22/2017    Medications- reviewed and updated Current Outpatient Prescriptions  Medication Sig Dispense Refill  . acetaminophen (TYLENOL) 650 MG CR tablet Take two 650 mg Tabs twice daily for pain 60 tablet 0  . allopurinol (ZYLOPRIM) 100 MG tablet Take 1 tablet (100 mg total) by mouth daily. 90 tablet 0  . aspirin 81 MG chewable tablet Chew 81 mg by mouth at bedtime.     . bisacodyl (DULCOLAX) 5 MG EC tablet Take 2 tablets (10 mg total) by mouth every morning. 30 tablet 0  .  carbidopa-levodopa (SINEMET CR) 50-200 MG tablet TAKE 1 TABLET 5 TIMES A DAY 450 tablet 1  . cholecalciferol (VITAMIN D) 1000 units tablet Take 1,000 Units by mouth daily.    . furosemide (LASIX) 40 MG tablet TAKE 1 TABLET BY MOUTH TWICE A DAY 180 tablet 3  . glucosamine-chondroitin 500-400 MG tablet Take 1 tablet by mouth 2 (two) times daily.    . Magnesium Oxide 500 MG TABS Take 500 mg by mouth 2 (two) times daily.    . meloxicam (MOBIC) 7.5 MG tablet TAKE 1 TABLET (7.5 MG TOTAL) BY MOUTH DAILY. 30 tablet 4  . Mirabegron ER (MYRBETRIQ) 25 MG TB24 Take 25 mg by mouth 2 (two) times daily.     . Misc Natural Products (TART CHERRY ADVANCED PO) Take 2 tablets by mouth.    . nitrofurantoin, macrocrystal-monohydrate, (MACROBID) 100 MG capsule Take 100 mg by mouth at bedtime.     . polyethylene glycol (MIRALAX / GLYCOLAX) packet Take 17 g by mouth daily. 14 each 0  . potassium chloride SA (KLOR-CON M20) 20 MEQ tablet Take 1 tablet (20 mEq total) by mouth 2 (two) times daily. 180 tablet 2  . rosuvastatin (CRESTOR) 10 MG tablet TAKE 1 TABLET DAILY 90 tablet 3  . selegiline (ELDEPRYL) 5 MG capsule Take 5 mg by mouth 2 (two) times daily.    . tamsulosin (FLOMAX) 0.4 MG CAPS capsule TAKE 1 CAPSULE  DAILY 90 capsule 3  . TURMERIC PO Take by mouth.     No current facility-administered medications for this visit.     Objective: BP (!) 142/80 (BP Location: Left Arm, Patient Position: Sitting, Cuff Size: Large)   Pulse 68   Temp 97.7 F (36.5 C) (Oral)   Ht 5\' 9"  (1.753 m)   Wt 235 lb (106.6 kg)   SpO2 96%   BMI 34.70 kg/m  Gen: NAD, resting comfortably CV: RRR no murmurs rubs or gallops Lungs: CTAB no crackles, wheeze, rhonchi Abdomen: soft/nontender/nondistended/normal bowel sounds. No rebound or guarding.  Ext: 1+ pitting edema despite compression stockings Skin: warm, dry Neuro: walks with walker, leans to right in his chair  Assessment/Plan:  Prior phq9 elevated but phq2 is 0- he has no  anhedonia thus no depression. Denies down mood at present as well  Chronic diastolic heart failure S:  weight down 3 lbs from last visit- likely due to dietary changes. Edema stable but severe. Compliant with lasix 40mg  BID. New compression stockings helping with edema A/P:  continue current medications   Essential hypertension S: poorly controlled on lasix 40mg  BID. mobic likely raises BP some but helps him tremendously with joint pain- benefits seem to outweigh risks BP Readings from Last 3 Encounters:  08/24/17 (!) 142/80  08/02/17 (!) 152/82  06/29/17 122/80  A/P: We discussed blood pressure goal of <140/90. Continue current meds:  We discussed doing some home monitoring and returning in next few weeks- if also elevated at home likely need to add medication. Consider Ace-I addition such as lisinopril 5mg   Hyperglycemia S: down another 3 lbs. Tightened diet through wife helping Lab Results  Component Value Date   HGBA1C 6.1 04/20/2017  A/P: hold off on a1c today- consider repeat at follow up. Continue weight loss efforts  Hyperlipidemia S: well controlled on crestor 10mg  with LDL 32. No myalgias.  A/P: continue current rx  Future Appointments Date Time Provider [place]  09/01/2017 2:00 PM [facility] NURSE [facility] None  09/06/2017 1:40 PM Meredith Staggers, MD [facility] CPR  09/25/2017 2:15 PM Marin Olp, MD [facility] None  10/27/2017 1:00 PM Tat, Eustace Quail, DO [facility] None   4 months  Meds ordered this encounter  Medications  . Misc Natural Products (TART CHERRY ADVANCED PO)    Sig: Take 2 tablets by mouth.   Return precautions advised.  [name], MD

## 2017-08-24 NOTE — Patient Instructions (Addendum)
No changes today  Your blood pressure trend concerns me. I would like for you to use a home cuff to check at least 5x a week. Your goal is <140/90 or at least <150/90. If you note in the next few weeks that it is higher than our goal, see me sooner. Otherwise, see me in 3-4 weeks. Bring your home cuff and your log of blood pressures with you to visit.   Watch out for salt intake  ______________________________________________________________________  Starting October 1st 2018, I will be transferring to our new location:  I would love to have you remain my patient at this new location as long as it remains convenient for you. I am excited about the opportunity to have x-ray and sports medicine in the new building but will really miss the awesome staff and physicians at [facility]. Continue to schedule appointments at [facility] and we will automatically transfer them to the [place] location starting October 1st.

## 2017-08-25 ENCOUNTER — Ambulatory Visit: Payer: Medicare Other

## 2017-08-25 NOTE — Assessment & Plan Note (Signed)
S: down another 3 lbs. Tightened diet through wife helping Lab Results  Component Value Date   HGBA1C 6.1 04/20/2017  A/P: hold off on a1c today- consider repeat at follow up. Continue weight loss efforts

## 2017-08-25 NOTE — Assessment & Plan Note (Signed)
S: poorly controlled on lasix 40mg  BID. mobic likely raises BP some but helps him tremendously with joint pain- benefits seem to outweigh risks BP Readings from Last 3 Encounters:  08/24/17 (!) 142/80  08/02/17 (!) 152/82  06/29/17 122/80  A/P: We discussed blood pressure goal of <140/90. Continue current meds:  We discussed doing some home monitoring and returning in next few weeks- if also elevated at home likely need to add medication. Consider Ace-I addition such as lisinopril 5mg 

## 2017-08-25 NOTE — Assessment & Plan Note (Signed)
S:  weight down 3 lbs from last visit- likely due to dietary changes. Edema stable but severe. Compliant with lasix 40mg  BID. New compression stockings helping with edema A/P:  continue current medications

## 2017-08-25 NOTE — Assessment & Plan Note (Signed)
S: well controlled on crestor 10mg  with LDL 32. No myalgias.  A/P: continue current rx

## 2017-08-26 ENCOUNTER — Other Ambulatory Visit: Payer: Self-pay | Admitting: Neurology

## 2017-09-01 ENCOUNTER — Ambulatory Visit (INDEPENDENT_AMBULATORY_CARE_PROVIDER_SITE_OTHER): Payer: Medicare Other | Admitting: Neurology

## 2017-09-01 DIAGNOSIS — E538 Deficiency of other specified B group vitamins: Secondary | ICD-10-CM | POA: Diagnosis not present

## 2017-09-01 MED ORDER — CYANOCOBALAMIN 1000 MCG/ML IJ SOLN
1000.0000 ug | Freq: Once | INTRAMUSCULAR | Status: AC
  Administered 2017-09-01: 1000 ug via INTRAMUSCULAR

## 2017-09-06 ENCOUNTER — Encounter: Payer: Medicare Other | Attending: Physical Medicine & Rehabilitation | Admitting: Physical Medicine & Rehabilitation

## 2017-09-06 VITALS — BP 144/77 | HR 71

## 2017-09-06 DIAGNOSIS — M1711 Unilateral primary osteoarthritis, right knee: Secondary | ICD-10-CM | POA: Diagnosis not present

## 2017-09-06 DIAGNOSIS — M109 Gout, unspecified: Secondary | ICD-10-CM | POA: Diagnosis not present

## 2017-09-06 DIAGNOSIS — M48061 Spinal stenosis, lumbar region without neurogenic claudication: Secondary | ICD-10-CM | POA: Diagnosis not present

## 2017-09-06 DIAGNOSIS — Z9841 Cataract extraction status, right eye: Secondary | ICD-10-CM | POA: Insufficient documentation

## 2017-09-06 DIAGNOSIS — G20 Parkinson's disease: Secondary | ICD-10-CM | POA: Diagnosis not present

## 2017-09-06 DIAGNOSIS — E785 Hyperlipidemia, unspecified: Secondary | ICD-10-CM | POA: Insufficient documentation

## 2017-09-06 DIAGNOSIS — Z96641 Presence of right artificial hip joint: Secondary | ICD-10-CM | POA: Insufficient documentation

## 2017-09-06 DIAGNOSIS — Z8249 Family history of ischemic heart disease and other diseases of the circulatory system: Secondary | ICD-10-CM | POA: Diagnosis not present

## 2017-09-06 DIAGNOSIS — Z9842 Cataract extraction status, left eye: Secondary | ICD-10-CM | POA: Insufficient documentation

## 2017-09-06 DIAGNOSIS — Z9049 Acquired absence of other specified parts of digestive tract: Secondary | ICD-10-CM | POA: Diagnosis not present

## 2017-09-06 DIAGNOSIS — I10 Essential (primary) hypertension: Secondary | ICD-10-CM | POA: Insufficient documentation

## 2017-09-06 DIAGNOSIS — Z86718 Personal history of other venous thrombosis and embolism: Secondary | ICD-10-CM | POA: Insufficient documentation

## 2017-09-06 DIAGNOSIS — M47816 Spondylosis without myelopathy or radiculopathy, lumbar region: Secondary | ICD-10-CM | POA: Diagnosis not present

## 2017-09-06 DIAGNOSIS — G8110 Spastic hemiplegia affecting unspecified side: Secondary | ICD-10-CM | POA: Diagnosis not present

## 2017-09-06 DIAGNOSIS — Z809 Family history of malignant neoplasm, unspecified: Secondary | ICD-10-CM | POA: Insufficient documentation

## 2017-09-06 DIAGNOSIS — R293 Abnormal posture: Secondary | ICD-10-CM | POA: Insufficient documentation

## 2017-09-06 DIAGNOSIS — G4730 Sleep apnea, unspecified: Secondary | ICD-10-CM | POA: Insufficient documentation

## 2017-09-06 NOTE — Patient Instructions (Signed)
PLEASE FEEL FREE TO CALL OUR OFFICE WITH ANY PROBLEMS OR QUESTIONS ([phone])      

## 2017-09-07 ENCOUNTER — Encounter: Payer: Self-pay | Admitting: Physical Medicine & Rehabilitation

## 2017-09-07 NOTE — Progress Notes (Signed)
Botox Injection for spasticity using needle EMG guidance Indication: Spastic hemiparesis affecting dominant side (HCC)   Dilution: 100 Units/ml        Total Units Injected: 200  Right lower ext Indication: Severe spasticity which interferes with ADL,mobility and/or  hygiene and is unresponsive to medication management and other conservative care Informed consent was obtained after describing risks and benefits of the procedure with the patient. This includes bleeding, bruising, infection, excessive weakness, or medication side effects. A REMS form is on file and signed.  Needle: 68mm injectable monopolar needle electrode  Number of units per muscle  Quadriceps 0 units Gastroc/soleus 0 units Hamstrings 200 units 150 into right biceps femoris using 3 access points, 50 units into semimembranosus/tendinosis Tibialis Posterior 0 units EHL 0 units All injections were done after obtaining appropriate EMG activity and after negative drawback for blood. The patient tolerated the procedure well. Post procedure instructions were given. Return in about 2 months (around 11/06/2017).

## 2017-09-13 ENCOUNTER — Other Ambulatory Visit: Payer: Self-pay | Admitting: Family Medicine

## 2017-09-14 DIAGNOSIS — Z23 Encounter for immunization: Secondary | ICD-10-CM | POA: Diagnosis not present

## 2017-09-25 ENCOUNTER — Ambulatory Visit: Payer: Medicare Other | Admitting: Family Medicine

## 2017-09-25 ENCOUNTER — Telehealth: Payer: Self-pay | Admitting: Family Medicine

## 2017-09-25 ENCOUNTER — Encounter: Payer: Self-pay | Admitting: Family Medicine

## 2017-09-25 NOTE — Telephone Encounter (Signed)
noted 

## 2017-09-25 NOTE — Telephone Encounter (Signed)
Patient's wife called to advise that the patient's blood pressure has been a bit high and she will discuss further at his next appointment. Today's visit was cancelled due to the patient having parkinson's and have fallen yesterday and the muscles being too tight. The wife will call back to reschedule.

## 2017-09-25 NOTE — Telephone Encounter (Signed)
noted thanks  

## 2017-09-27 ENCOUNTER — Ambulatory Visit: Payer: Medicare Other | Attending: Neurology | Admitting: Physical Therapy

## 2017-09-27 DIAGNOSIS — R2689 Other abnormalities of gait and mobility: Secondary | ICD-10-CM | POA: Insufficient documentation

## 2017-09-27 DIAGNOSIS — R293 Abnormal posture: Secondary | ICD-10-CM | POA: Insufficient documentation

## 2017-09-27 NOTE — Therapy (Signed)
[facility] [address] [place], [place], [address] Phone: [phone]   Fax:  [phone]  Physical Therapy Evaluation  Patient Details  Name: Walter Gill MRN: [id number] Date of Birth: Dec 25, 1933 Referring Provider: Alonza Bogus, DO  Encounter Date: 09/27/2017      PT End of Session - 09/27/17 2109    Visit Number 1   Authorization Type Medicare   PT Start Time 0925   PT Stop Time 6440   PT Time Calculation (min) 74 min      Past Medical History:  Diagnosis Date  . DVT (deep venous thrombosis) ([place])   . Gout   . Hyperlipidemia   . Hypertension   . HYPERTENSION 07/02/2007   Qualifier: Diagnosis of  By: Leanne Chang MD, Bruce    . Left ventricular dysfunction    impaired LV relaxation  . OA (osteoarthritis)   . Parkinson disease ([place])   . PE (pulmonary embolism) september 2008  . RBBB (right bundle branch block)   . Sleep apnea     Past Surgical History:  Procedure Laterality Date  . APPENDECTOMY    . CATARACT EXTRACTION     bilateral  . CHOLECYSTECTOMY    . dupruytens contracture     left  . EYE SURGERY     ? for lid lag vs apraxia of eye opening  . pilonidal cyst removal    . TOTAL HIP ARTHROPLASTY  9/8/8   right    There were no vitals filed for this visit.       Subjective Assessment - 09/27/17 2100    Subjective Pt accompanied by his wife - presents for PT eval - using a rollator   Patient is accompained by: Family member   Pertinent History Parkinson's disease   Currently in Pain? Yes   Pain Score 5    Pain Location Back   Pain Orientation Lower   Pain Descriptors / Indicators Aching   Pain Type Chronic pain            [facility] PT Assessment - 09/27/17 0950      Assessment   Medical Diagnosis Parkinson's disease   Referring Provider Wells Guiles Tat, DO   Onset Date/Surgical Date --  approx. 2006 for PD    Prior Therapy OPPT, OT and ST at this facility in 2016     Precautions    Precautions Fall     Balance Screen   Has the patient fallen in the past 6 months Yes   How many times? 1   Has the patient had a decrease in activity level because of a fear of falling?  Yes   Is the patient reluctant to leave their home because of a fear of falling?  Yes            Objective measurements completed on examination: See above findings.          Mobility/Seating Evaluation    PATIENT INFORMATION: Name:  Walter Gill DOB: 04-28-1934  Sex: M Date seen: 09-27-17 Time: 0930  Address:  [address]                 [place], [place] [address] Physician: Alonza Bogus, DO This evaluation/justification form will serve as the LMN for the following suppliers: __________________________ Supplier: [employer] Contact Person: Felton Clinton, Wess Botts Phone:  [phone]   Seating Therapist: Guido Sander, PT Phone:   [phone]   Phone: [phone]    Spouse/Parent/Caregiver name: [place]  Phone  number: [phone] Insurance/Payer: Medicare and Roseanna Rainbow     Reason for Referral: power wheelchair evaluation  Patient/Caregiver Goals: obtain power wheelchair  Patient was seen for face-to-face evaluation for new power wheelchair.  Also present was [employer] to discuss recommendations and wheelchair options.  Further paperwork was completed and sent to vendor.  Patient appears to qualify for power mobility device at this time per objective findings.   MEDICAL HISTORY: Diagnosis: Primary Diagnosis: Parkinson's Disease with dystonia Onset: approx. 2006 Diagnosis: s/p PE in Sept. 2008   [x] Progressive Disease Relevant past and future surgeries: Rt THR in Sept. 2008   Height: 68" Weight: 240# Explain recent changes or trends in weight: ?????   History including Falls: Pt reports he fell Sunday when he was getting up from sitting at table - did not sustain injury but had to call EMS for assistance in getting up.  Pt is amb. with RW with unsteady gait pattern with  deviations.  Pt states he has been using RW for assistance with amb. for approx. past 10 years.      HOME ENVIRONMENT: [] House  [x] Condo/town home  [] Apartment  [] Assisted Living    [] Lives Alone [x]  Lives with Others                                                                                          Hours with caregiver: ?????  [] Home is accessible to patient           Stairs      [x] Yes []  No     Ramp [] Yes [] No Comments:  2 steps at entrance to get into house   COMMUNITY ADL: TRANSPORTATION: [] Car    [] Van    [] Public Transportation    [] Adapted w/c Lift    [] Ambulance    [x] Other:       [] Sits in wheelchair during transport  Employment/School: ????? Specific requirements pertaining to mobility ?????  Other: Pt has K[employer] SUV    FUNCTIONAL/SENSORY PROCESSING SKILLS:  Handedness:   [x] Right     [] Left    [] NA  Comments:  ?????  Functional Processing Skills for Wheeled Mobility [x] Processing Skills are adequate for safe wheelchair operation  Areas of concern than may interfere with safe operation of wheelchair Description of problem   []  Attention to environment      [] Judgment      []  Hearing  []  Vision or visual processing      [] Motor Planning  []  Fluctuations in Behavior  ?????    VERBAL COMMUNICATION: [x] WFL receptive [x]  WFL expressive [] Understandable  [] Difficult to understand  [] non-communicative []  Uses an augmented communication device  CURRENT SEATING / MOBILITY: Current Mobility Base:  [x] None [] Dependent [] Manual [] Scooter [] Power  Type of Control: ?????  Manufacturer:  ?????Size:  ?????Age: ?????  Current Condition of Mobility Base:  ?????   Current Wheelchair components:  ?????  Describe posture in present seating system:  ?????      SENSATION and SKIN ISSUES: Sensation [] Intact  [x] Impaired [] Absent  Level of sensation: ????? Pressure Relief: Able to perform effective pressure relief :    [] Yes  [x]  No Method: ????  If not, Why?: ?????  Skin Issues/Skin  Integrity Current Skin Issues  [] Yes [x] No [] Intact []  Red area[]  Open Area  [] Scar Tissue [] At risk from prolonged sitting Where  ?????  History of Skin Issues  [] Yes [x] No Where  ????? When  ?????  Hx of skin flap surgeries  [] Yes [x] No Where  ????? When  ?????  Limited sitting tolerance [] Yes [x] No Hours spent sitting in wheelchair daily: ?????  Complaint of Pain:  Please describe: Pt reports pain in low back due to bulging discs and OA in spine: pain in Rt side due to the dystona    Swelling/Edema: pt has moderate edema in bil. LE's with LLE more swollen than RLE   ADL STATUS (in reference to wheelchair use):  Indep Assist Unable Indep with Equip Not assessed Comments  Dressing ????? X ????? X ????? occasional assistance needed; performs lower body dressing in seated position with use of reacher  Eating ????? X ????? ????? ????? needs occasional assistance depending on dystonia  Toileting ????? ????? ????? X ????? uses 3 in 1 over commode  Bathing ????? ????? ????? X ????? uses tub bench and suction grab bar  Grooming/Hygiene ????? ????? ????? X ????? performs in seated position  Meal Prep ????? ????? X ????? ????? ?????  IADLS ????? X ????? ????? ????? uses RW or rollator for short distance ambulation  Bowel Management: [x] Continent  [] Incontinent  [] Accidents Comments:  ?????  Bladder Management: [x] Continent  [] Incontinent  [] Accidents Comments:  ?????     WHEELCHAIR SKILLS: Manual w/c Propulsion: [] UE or LE strength and endurance sufficient to participate in ADLs using manual wheelchair Arm : [] left [] right   [] Both      Distance: ????? Foot:  [] left [] right   [] Both  Operate Scooter: []  Strength, hand grip, balance and transfer appropriate for use [] Living environment is accessible for use of scooter  Operate Power w/c:  [x]  Std. Joystick   []  Alternative Controls Indep [x]  Assist [x]  Dependent/unable []  N/A []   [x] Safe          [x]  Functional      Distance: ?????   Bed confined without wheelchair []  Yes [x]  No   STRENGTH/RANGE OF MOTION:  Active  Range of Motion Strength  Shoulder Lt shoulder flexion 98 degrees;  Lt abduction 96 degrees:  Rt shoulder  flexion 114 degrees; abdct. = 120 degrees bil. shoulder strength 3-/5  Elbow WNL's bil. UE's WFL's bil. UE's  Wrist/Hand decreased Lt finger flexion due to trigger finger; Rt finger flexion WFL's; bil. Wrist AROM WFL's Rt grip strength average 55# with N= 65# for his age Lt grip strength ave. 43#  Hip WFL's bil. LE's WFL's bil. LE's  Knee Rt knee active ext. = -28 degrees: Lt knee extension WFL's; bil. knee flexion WFL's WFL's bil. LE's  Ankle bil. ankle AROM WFL's WFL's bil. LE's     MOBILITY/BALANCE:  []  Patient is totally dependent for mobility  ?????    Balance Transfers Ambulation  Sitting Balance: Standing Balance: []  Independent []  Independent/Modified Independent  [x]  WFL     []  WFL []  Supervision []  Supervision  []  Uses UE for balance  []  Supervision [x]  Min Assist [x]  Ambulates with Assist  ?????    []  Min Assist [x]  Min assist []  Mod Assist [x]  Ambulates with Device:      [x]  RW  []  StW  []  Cane  []  ?????  []  Mod Assist []  Mod assist []  Max assist   []  Max Assist []   Max assist []  Dependent []  Indep. Short Distance Only  []  Unable []  Unable []  Lift / Sling Required Distance (in feet)  ?????   []  Sliding board []  Unable to Ambulate (see explanation below)  Cardio Status:  [] Intact  [x]  Impaired   []  NA     ?????  Respiratory Status:  [] Intact   [x] Impaired   [] NA     pt has sleep apnea; currently does not use CPAP machine  Orthotics/Prosthetics: None  Comments (Address manual vs power w/c vs scooter): Pt presents with significant postural abnormalities due to Parkinson's disease with dystonia with pt's trunk laterally flexed to right side.  Pt is at high risk for falls as he amb. with bil. knees flexed with postural deviations.  Pt also demonstrates decr. dorsiflexion in swing phase with  slight shuffling gait pattern noted.  Pt is unable to perform sit to stand transfers without UE support and requires min assist for standing balance.  Pt is unable to functionally and effectivley propel a manual wheelchair due to decr. bil. UE AROM and decr. bil. shoulder strength.  Pt also has impaired cardiovascular dysfunction with diagnosis of Lt ventricular dysfunction.  Pt is unable to operate a scooter due to his decreased UE strength and decr. postural control due to dystonia secondary to Parkinson's disease.  Pt would also be unable to safely transfer on and off the platform of a scooter.              Anterior / Posterior Obliquity Rotation-Pelvis Rt lateral trunk lean  PELVIS    []  [x]  []   Neutral Posterior Anterior  []  [x]  []   WFL Rt elev Lt elev  []  [x]  []   WFL Right Left                      Anterior    Anterior     []  Fixed []  Other [x]  Partly Flexible []  Flexible   []  Fixed []  Other [x]  Partly Flexible  []  Flexible  []  Fixed []  Other [x]  Partly Flexible  []  Flexible   TRUNK  []  [x]  []   WFL ? Thoracic ? Lumbar  Kyphosis Lordosis  []  [x]  []   WFL Convex Convex  Right Left [x] c-curve [] s-curve [] multiple  []  Neutral []  Left-anterior [x]  Right-anterior     []  Fixed []  Flexible [x]  Partly Flexible []  Other  []  Fixed []  Flexible []  Partly Flexible []  Other  []  Fixed             []  Flexible [x]  Partly Flexible []  Other    Position Windswept  ?????  HIPS          [x]            []               []    Neutral       Abduct        ADduct         [x]           []            []   Neutral Right           Left      []  Fixed []  Subluxed []  Partly Flexible []  Dislocated []  Flexible  []  Fixed []  Other []  Partly Flexible  []  Flexible                 Foot Positioning Knee Positioning  ?????    [x]  WFL  [x] Lt [  x]Rt [x]  WFL  [x] Lt [x] Rt    KNEES ROM concerns: ROM concerns:    & Dorsi-Flexed [] Lt [] Rt ?????    FEET Plantar Flexed [] Lt [] Rt      Inversion                  [] Lt [] Rt      Eversion                 [] Lt [] Rt     HEAD [x]  Functional [x]  Good Head Control  ?????  & []  Flexed         []  Extended []  Adequate Head Control    NECK []  Rotated  Lt  []  Lat Flexed Lt []  Rotated  Rt []  Lat Flexed Rt []  Limited Head Control     []  Cervical Hyperextension []  Absent  Head Control     SHOULDERS ELBOWS WRIST& HAND Difficulty gripping with Rt hand due to numbness in Rt thumb and 1st and 2nd fingers;  trigger finger on Lt hand      Left     Right    Left     Right    Left     Right   U/E [] Functional           [x] Functional [place] WFL [] Fisting             [] Fisting      [] elev   [x] dep      [] elev   [] dep       [] pro -[] retract     [] pro  [] retract [x] subluxed             [] subluxed           Goals for Wheelchair Mobility  [x]  Independence with mobility in the home with motor related ADLs (MRADLs)  []  Independence with MRADLs in the community []  Provide dependent mobility  []  Provide recline     [x] Provide tilt   Goals for Seating system [x]  Optimize pressure distribution [x]  Provide support needed to facilitate function or safety [x]  Provide corrective forces to assist with maintaining or improving posture []  Accommodate client's posture:   current seated postures and positions are not flexible or will not tolerate corrective forces [x]  Client to be independent with relieving pressure in the wheelchair [] Enhance physiological function such as breathing, swallowing, digestion  Simulation ideas/Equipment trials:????? State why other equipment was unsuccessful:?????   MOBILITY BASE RECOMMENDATIONS and JUSTIFICATION: MOBILITY COMPONENT JUSTIFICATION  Manufacturer: [employer] Model: Q6 Edge 2.0   Size: Width 18Seat Depth 20 [x] provide transport from point A to B      [x] promote Indep mobility  [x] is not a safe, functional ambulator [x] walker or cane inadequate [] non-standard width/depth necessary to accommodate anatomical measurement []  ?????  [] Manual  Mobility Base [] non-functional ambulator    [] Scooter/POV  [] can safely operate  [] can safely transfer   [] has adequate trunk stability  [] cannot functionally propel manual w/c  [x] Power Mobility Base  [] non-ambulatory  [x] cannot functionally propel manual wheelchair  [x]  cannot functionally and safely operate scooter/POV [x] can safely operate and willing to  [] Stroller Base [] infant/child  [] unable to propel manual wheelchair [] allows for growth [] non-functional ambulator [] non-functional UE [] Indep mobility is not a goal at this time  [x] Tilt  [] Forward [x] Backward [x] Powered tilt  [] Manual tilt  [x] change position against gravitational force on head and shoulders  [x] change position for pressure relief/cannot weight shift [] transfers  [] management of tone [x] rest periods [x] control edema [x] facilitate postural control  []  ?????  [] Recline  []   Power recline on power base [] Manual recline on manual base  [] accommodate femur to back angle  [] bring to full recline for ADL care  [] change position for pressure relief/cannot weight shift [] rest periods [] repositioning for transfers or clothing/diaper /catheter changes [] head positioning  [] Lighter weight required [] self- propulsion  [] lifting []  ?????  [] Heavy Duty required [] user weight greater than 250# [] extreme tone/ over active movement [] broken frame on previous chair []  ?????  [x]  Back  []  Angle Adjustable []  Custom molded Jay 3 Deep Contour [x] postural control [] control of tone/spasticity [] accommodation of range of motion [] UE functional control [] accommodation for seating system []  ????? [x] provide lateral trunk support [] accommodate deformity [x] provide posterior trunk support [x] provide lumbar/sacral support [x] support trunk in midline [x] Pressure relief over spinal processes  [x]  Seat Cushion Spectrum Foam positioning and skin protection [] impaired sensation  [] decubitus ulcers present [] history of pressure  ulceration [] prevent pelvic extension [x] low maintenance  [x] stabilize pelvis  [x] accommodate obliquity [] accommodate multiple deformity [x] neutralize lower extremity position [x] increase pressure distribution [x]  at risk with prolonged sitting  []  Pelvic/thigh support  []  Lateral thigh guide []  Distal medial pad  []  Distal lateral pad []  pelvis in neutral [] accommodate pelvis []  position upper legs []  alignment []  accommodate ROM []  decr adduction [] accommodate tone [] removable for transfers [] decr abduction  []  Lateral trunk Supports []  Lt     []  Rt [] decrease lateral trunk leaning [] control tone [] contour for increased contact [] safety  [] accommodate asymmetry []  ?????  [x]  Mounting hardware  [] lateral trunk supports  [] back   [] seat [x] headrest      []  thigh support [] fixed   [] swing away [] attach seat platform/cushion to w/c frame [] attach back cushion to w/c frame [] mount postural supports [x] mount headrest  [] swing medial thigh support away [] swing lateral supports away for transfers  []  ?????    Armrests  [] fixed [x] adjustable height [] removable   [] swing away  [x] flip back   [] reclining [x] full length pads [] desk    [] pads tubular  [x] provide support with elbow at 90   [] provide support for w/c tray [] change of height/angles for variable activities [] remove for transfers [] allow to come closer to table top [x] remove for access to tables []  ?????  Hangers/ Leg rests  [] 60 [] 70 [] 90 [x] elevating [] heavy duty  [x] articulating [] fixed [] lift off [] swing away     [x] power [x] provide LE support  [] accommodate to hamstring tightness [] elevate legs during recline   [x] provide change in position for Legs [] Maintain placement of feet on footplate [] durability [] enable transfers [x] decrease edema [] Accommodate lower leg length []  ?????  Foot support Footplate    [] Lt  []  Rt  [x]  Center mount [x] flip up     [x] depth/angle adjustable [] Amputee adapter    []  Lt      []  Rt [x] provide foot support [] accommodate to ankle ROM [] transfers [] Provide support for residual extremity []  allow foot to go under wheelchair base []  decrease tone  []  ?????  []  Ankle strap/heel loops [] support foot on foot support [] decrease extraneous movement [] provide input to heel  [] protect foot  Tires: [] pneumatic  [x] flat free inserts  [] solid  [x] decrease maintenance  [x] prevent frequent flats [] increase shock absorbency [] decrease pain from road shock [] decrease spasms from road shock []  ?????  [x]  Headrest  [x] provide posterior head support [x] provide posterior neck support [] provide lateral head support [] provide anterior head support [x] support during tilt and recline [] improve feeding   [] improve respiration [] placement of switches [x] safety  [] accommodate ROM  [] accommodate tone [] improve visual orientation  []  Anterior chest strap []  Vest []   Shoulder retractors  [] decrease forward movement of shoulder [] accommodation of TLSO [] decrease forward movement of trunk [] decrease shoulder elevation [] added abdominal support [] alignment [] assistance with shoulder control  []  ?????  Pelvic Positioner [x] Belt [] SubASIS bar [] Dual Pull [] stabilize tone [x] decrease falling out of chair/ **will not Decr potential for sliding due to pelvic tilting [] prevent excessive rotation [] pad for protection over boney prominence [] prominence comfort [] special pull angle to control rotation []  ?????  Upper Extremity Support [] L   []  R [] Arm trough    [] hand support []  tray       [] full tray [] swivel mount [] decrease edema      [] decrease subluxation   [] control tone   [] placement for AAC/Computer/EADL [] decrease gravitational pull on shoulders [] provide midline positioning [] provide support to increase UE function [] provide hand support in natural position [] provide work surface   POWER WHEELCHAIR CONTROLS  [x] Proportional  [] Non-Proportional Type  Joystick [] Left  [x] Right [x] provides access for controlling wheelchair   [] lacks motor control to operate proportional drive control [] unable to understand proportional controls  Actuator Control Module  [] Single  [x] Multiple   [x] Allow the client to operate the power seat function(s) through the joystick control   [] Safety Reset Switches [] Used to change modes and stop the wheelchair when driving in latch mode    [x] Upgraded Electronics   [x] programming for accurate control [x] progressive Disease/changing condition [] non-proportional drive control needed [x] Needed in order to operate power seat functions through joystick control   [] Display box [] Allows user to see in which mode and drive the wheelchair is set  [] necessary for alternate controls    [] Digital interface electronics [] Allows w/c to operate when using alternative drive controls  [] ASL Head Array [] Allows client to operate wheelchair  through switches placed in tri-panel headrest  [] Sip and puff with tubing kit [] needed to operate sip and puff drive controls  [] Upgraded tracking electronics [] increase safety when driving [] correct tracking when on uneven surfaces  [x] Mount for switches or joystick [] Attaches switches to w/c  [x] Swing away for access or transfers [] midline for optimal placement [] provides for consistent access  [] Attendant controlled joystick plus mount [] safety [] long distance driving [] operation of seat functions [] compliance with transportation regulations []  ?????    Rear wheel placement/Axle adjustability [] None [] semi adjustable [] fully adjustable  [] improved UE access to wheels [] improved stability [] changing angle in space for improvement of postural stability [] 1-arm drive access [] amputee pad placement []  ?????  Wheel rims/ hand rims  [] metal  [] plastic coated [] oblique projections [] vertical projections [] Provide ability to propel manual wheelchair  []  Increase self-propulsion with hand  weakness/decreased grasp  Push handles [] extended  [] angle adjustable  [] standard [] caregiver access [] caregiver assist [] allows "hooking" to enable increased ability to perform ADLs or maintain balance  One armed device  [] Lt   [] Rt [] enable propulsion of manual wheelchair with one arm   []  ?????   Brake/wheel lock extension []  Lt   []  Rt [] increase indep in applying wheel locks   [] Side guards [] prevent clothing getting caught in wheel or becoming soiled []  prevent skin tears/abrasions  Battery: NF 22 x 2 [x] to power wheelchair ?????  Other: Ball style joystick knob needed to accurately operate and maneuver wheelchair due to decreased dexterity in Rt hand due to numbness in thumb and 1st 2 fingers ?????  The above equipment has a life- long use expectancy. Growth and changes in medical and/or functional conditions would be the exceptions. This is to certify that the therapist has no financial relationship with durable medical provider or manufacturer.  The therapist will not receive remuneration of any kind for the equipment recommended in this evaluation.   Patient has mobility limitation that significantly impairs safe, timely participation in one or more mobility related ADL's.  (bathing, toileting, feeding, dressing, grooming, moving from room to room)                                                             [x]  Yes []  No Will mobility device sufficiently improve ability to participate and/or be aided in participation of MRADL's?         [x]  Yes []  No Can limitation be compensated for with use of a cane or walker?                                                                                []  Yes [x]  No Does patient or caregiver demonstrate ability/potential ability & willingness to safely use the mobility device?   [x]  Yes []  No Does patient's home environment support use of recommended mobility device?                                                    [x]  Yes []  No Does patient have  sufficient upper extremity function necessary to functionally propel a manual wheelchair?    []  Yes [x]  No Does patient have sufficient strength and trunk stability to safely operate a POV (scooter)?                                  []  Yes [x]  No Does patient need additional features/benefits provided by a power wheelchair for MRADL's in the home?       [x]  Yes []  No Does the patient demonstrate the ability to safely use a power wheelchair?                                                              [x]  Yes []  No  Therapist Name Printed: Guido Sander, PT Date: 09-27-17  Therapist's Signature:   Date:   Supplier's Name Printed: [employer], [employer] Date: 09-27-17  Supplier's Signature:   Date:  Patient/Caregiver Signature:   Date:     This is to certify that I have read this evaluation and do agree with the content within:      Physician's Name Printed: Wells Guiles Tat, DO  Physician's Signature:  Date:     This is to certify that I, the above signed therapist have the following affiliations: []  This DME provider []  Manufacturer of recommended equipment []  Patient's long term care facility [  x] None of the above                           Plan - 2017/10/25 2110    Clinical Impression Statement Pt is an 83 yr old gentleman with PD - presents for power wheelchair eval - vendor Josh Cadle, [employer] with [employer] present during eval; recommend Group 3 power wheelchair with power tilt for independence with pressure relief and for elevation of LE's for edema reduction   Clinical Presentation Evolving   Consulted and Agree with Plan of Care Family member/caregiver;Patient   Family Member Consulted wife      Patient will benefit from skilled therapeutic intervention in order to improve the following deficits and impairments:     Visit Diagnosis: Other abnormalities of gait and mobility - Plan: PT plan of care cert/re-cert  Abnormal posture - Plan: PT plan of care cert/re-cert       G-Codes - 10-25-17 2113    Functional Assessment Tool Used (Outpatient Only) pt amb. with rollator - has unsteady gait pattern and is at risk for fall   Functional Limitation Mobility: Walking and moving around   Mobility: Walking and Moving Around Current Status [phone]) At least 60 percent but less than 80 percent impaired, limited or restricted   Mobility: Walking and Moving Around Goal Status [phone]) At least 60 percent but less than 80 percent impaired, limited or restricted   Mobility: Walking and Moving Around Discharge Status [phone]) At least 60 percent but less than 80 percent impaired, limited or restricted       Problem List Patient Active Problem List   Diagnosis Date Noted  . Trigger finger, acquired 08/02/2017  . Spastic hemiparesis affecting dominant side ([place]) 05/31/2017  . Spondylosis of lumbar region without myelopathy or radiculopathy 03/22/2017  . Posture abnormality 03/22/2017  . Hyperglycemia 03/17/2016  . Spinal stenosis of lumbar region 08/01/2015  . Urinary incontinence 01/14/2015  . Hyperlipidemia 10/29/2014  . Chronic diastolic heart failure ([place]) 10/29/2014  . Constipation 10/24/2014  . Chronic pain syndrome 10/01/2014  . B12 deficiency 06/06/2013  . OSA on CPAP 04/17/2013  . REM behavioral disorder 10/25/2012  . DVT, HX OF 10/16/2007  . PULMONARY EMBOLISM 10/08/2007  . Gout 07/02/2007  . PARKINSON'S DISEASE 07/02/2007  . Essential hypertension 07/02/2007  . RBBB 07/02/2007  . Primary localized osteoarthritis of right knee 07/02/2007    Walter Gill, [place], PT 10/25/2017, 9:20 PM  [facility] [address] [place], [place], [address] Phone: [phone]   Fax:  [phone]  Name: Walter Gill MRN: [id number] Date of Birth: [birth year]

## 2017-09-28 ENCOUNTER — Telehealth: Payer: Self-pay | Admitting: Neurology

## 2017-09-28 NOTE — Telephone Encounter (Signed)
Will probably just have to be WC evaluation visit.  Can't do other things in those visits so make sure that patient okay with that.  That's why Debbie asked the question

## 2017-09-28 NOTE — Telephone Encounter (Signed)
Patient is okay with appt being for evaluation. Debbie made aware.

## 2017-09-28 NOTE — Telephone Encounter (Signed)
Received call from [place] with [facility] [phone]) to make sure we could do a wheelchair evaluation on patient the day of his appt on 10/27/17. Any reason we can't do this?

## 2017-10-02 ENCOUNTER — Ambulatory Visit (INDEPENDENT_AMBULATORY_CARE_PROVIDER_SITE_OTHER): Payer: Medicare Other | Admitting: Neurology

## 2017-10-02 DIAGNOSIS — E538 Deficiency of other specified B group vitamins: Secondary | ICD-10-CM

## 2017-10-02 MED ORDER — CYANOCOBALAMIN 1000 MCG/ML IJ SOLN
1000.0000 ug | Freq: Once | INTRAMUSCULAR | Status: AC
  Administered 2017-10-02: 1000 ug via INTRAMUSCULAR

## 2017-10-04 ENCOUNTER — Encounter: Payer: Self-pay | Admitting: Physical Medicine & Rehabilitation

## 2017-10-04 ENCOUNTER — Encounter: Payer: Medicare Other | Attending: Physical Medicine & Rehabilitation | Admitting: Physical Medicine & Rehabilitation

## 2017-10-04 DIAGNOSIS — Z9049 Acquired absence of other specified parts of digestive tract: Secondary | ICD-10-CM | POA: Insufficient documentation

## 2017-10-04 DIAGNOSIS — M47816 Spondylosis without myelopathy or radiculopathy, lumbar region: Secondary | ICD-10-CM | POA: Diagnosis not present

## 2017-10-04 DIAGNOSIS — G20 Parkinson's disease: Secondary | ICD-10-CM | POA: Insufficient documentation

## 2017-10-04 DIAGNOSIS — Z86718 Personal history of other venous thrombosis and embolism: Secondary | ICD-10-CM | POA: Diagnosis not present

## 2017-10-04 DIAGNOSIS — Z809 Family history of malignant neoplasm, unspecified: Secondary | ICD-10-CM | POA: Insufficient documentation

## 2017-10-04 DIAGNOSIS — M1711 Unilateral primary osteoarthritis, right knee: Secondary | ICD-10-CM | POA: Diagnosis not present

## 2017-10-04 DIAGNOSIS — Z8249 Family history of ischemic heart disease and other diseases of the circulatory system: Secondary | ICD-10-CM | POA: Diagnosis not present

## 2017-10-04 DIAGNOSIS — G4730 Sleep apnea, unspecified: Secondary | ICD-10-CM | POA: Insufficient documentation

## 2017-10-04 DIAGNOSIS — M48061 Spinal stenosis, lumbar region without neurogenic claudication: Secondary | ICD-10-CM | POA: Diagnosis not present

## 2017-10-04 DIAGNOSIS — E785 Hyperlipidemia, unspecified: Secondary | ICD-10-CM | POA: Diagnosis not present

## 2017-10-04 DIAGNOSIS — R293 Abnormal posture: Secondary | ICD-10-CM | POA: Insufficient documentation

## 2017-10-04 DIAGNOSIS — Z9841 Cataract extraction status, right eye: Secondary | ICD-10-CM | POA: Insufficient documentation

## 2017-10-04 DIAGNOSIS — M6530 Trigger finger, unspecified finger: Secondary | ICD-10-CM | POA: Diagnosis not present

## 2017-10-04 DIAGNOSIS — I10 Essential (primary) hypertension: Secondary | ICD-10-CM | POA: Diagnosis not present

## 2017-10-04 DIAGNOSIS — Z9842 Cataract extraction status, left eye: Secondary | ICD-10-CM | POA: Insufficient documentation

## 2017-10-04 DIAGNOSIS — M109 Gout, unspecified: Secondary | ICD-10-CM | POA: Diagnosis not present

## 2017-10-04 DIAGNOSIS — Z96641 Presence of right artificial hip joint: Secondary | ICD-10-CM | POA: Diagnosis not present

## 2017-10-04 NOTE — Progress Notes (Signed)
PROCEDURE NOTE  DIAGNOSIS: Trigger finger left 4th digit  INTERVENTION:  Trigger finger injection     After informed consent and preparation of the skin with betadine and isopropyl alcohol, I injected 3mg  (0.5cc) of celestone and 2cc of 1% lidocaine proximal to  the 4th flexor tendon/pulley at the MCP junction via anterior approach. Additionally, aspiration was performed prior to injection. The patient tolerated well, and no complications were encountered. Afterward the area was cleaned and dressed. Post- injection instructions were provided.      Meredith Staggers, MD, [facility] 10/04/2017

## 2017-10-04 NOTE — Patient Instructions (Signed)
PLEASE FEEL FREE TO CALL OUR OFFICE WITH ANY PROBLEMS OR QUESTIONS ([phone])      

## 2017-10-16 ENCOUNTER — Encounter: Payer: Self-pay | Admitting: Family Medicine

## 2017-10-16 ENCOUNTER — Ambulatory Visit (INDEPENDENT_AMBULATORY_CARE_PROVIDER_SITE_OTHER): Payer: Medicare Other | Admitting: Family Medicine

## 2017-10-16 VITALS — BP 132/76 | HR 70 | Temp 97.6°F | Ht 69.0 in | Wt 231.0 lb

## 2017-10-16 DIAGNOSIS — I10 Essential (primary) hypertension: Secondary | ICD-10-CM | POA: Diagnosis not present

## 2017-10-16 DIAGNOSIS — E785 Hyperlipidemia, unspecified: Secondary | ICD-10-CM | POA: Diagnosis not present

## 2017-10-16 DIAGNOSIS — I5032 Chronic diastolic (congestive) heart failure: Secondary | ICD-10-CM

## 2017-10-16 DIAGNOSIS — R739 Hyperglycemia, unspecified: Secondary | ICD-10-CM | POA: Diagnosis not present

## 2017-10-16 NOTE — Assessment & Plan Note (Signed)
S: has lost 4 lbs from last visit Lab Results  Component Value Date   HGBA1C 6.1 04/20/2017  A/P: discussed importance continued weight loss. Mobility limited so has to contiue to eat better. Update a1c today

## 2017-10-16 NOTE — Assessment & Plan Note (Signed)
S: well controlled on crestor 10mg  with last LDL 12/19/16 at 32. No myalgias.   A/P: next time fasting plan to check full lipids, LDL at least at goal

## 2017-10-16 NOTE — Progress Notes (Signed)
Subjective:  Walter Gill is a 83 y.o. year old very pleasant male patient who presents for/with See problem oriented charting ROS- continued posture toward right with some chronic discomfort as result, unstable gait- using walker, did have fall several weeks ago- improving tailbone pain . Stable edema  Past Medical History-  Patient Active Problem List   Diagnosis Date Noted  . Spinal stenosis of lumbar region 08/01/2015    Priority: High  . Chronic diastolic heart failure ([place]) 10/29/2014    Priority: High  . Chronic pain syndrome 10/01/2014    Priority: High  . PARKINSON'S DISEASE 07/02/2007    Priority: High  . Hyperglycemia 03/17/2016    Priority: Medium  . Urinary incontinence 01/14/2015    Priority: Medium  . Hyperlipidemia 10/29/2014    Priority: Medium  . Constipation 10/24/2014    Priority: Medium  . OSA on CPAP 04/17/2013    Priority: Medium  . DVT, HX OF 10/16/2007    Priority: Medium  . Gout 07/02/2007    Priority: Medium  . Essential hypertension 07/02/2007    Priority: Medium  . B12 deficiency 06/06/2013    Priority: Low  . REM behavioral disorder 10/25/2012    Priority: Low  . PULMONARY EMBOLISM 10/08/2007    Priority: Low  . RBBB 07/02/2007    Priority: Low  . Primary localized osteoarthritis of right knee 07/02/2007    Priority: Low  . Trigger finger, acquired 08/02/2017  . Spastic hemiparesis affecting dominant side ([place]) 05/31/2017  . Spondylosis of lumbar region without myelopathy or radiculopathy 03/22/2017  . Posture abnormality 03/22/2017    Medications- reviewed and updated Current Outpatient Medications  Medication Sig Dispense Refill  . acetaminophen (TYLENOL) 650 MG CR tablet Take two 650 mg Tabs twice daily for pain 60 tablet 0  . allopurinol (ZYLOPRIM) 100 MG tablet Take 1 tablet (100 mg total) by mouth daily. 90 tablet 0  . aspirin 81 MG chewable tablet Chew 81 mg by mouth at bedtime.     . bisacodyl (DULCOLAX) 5 MG EC tablet Take  2 tablets (10 mg total) by mouth every morning. 30 tablet 0  . carbidopa-levodopa (SINEMET CR) 50-200 MG tablet TAKE 1 TABLET 5 TIMES A DAY 450 tablet 1  . cholecalciferol (VITAMIN D) 1000 units tablet Take 1,000 Units by mouth daily.    . furosemide (LASIX) 40 MG tablet TAKE 1 TABLET BY MOUTH TWICE A DAY 180 tablet 3  . glucosamine-chondroitin 500-400 MG tablet Take 1 tablet by mouth 2 (two) times daily.    Marland Kitchen KLOR-CON M20 20 MEQ tablet TAKE 1 TABLET BY MOUTH TWICE A DAY 180 tablet 0  . Magnesium Oxide 500 MG TABS Take 500 mg by mouth 2 (two) times daily.    . meloxicam (MOBIC) 7.5 MG tablet TAKE 1 TABLET (7.5 MG TOTAL) BY MOUTH DAILY. 30 tablet 4  . Mirabegron ER (MYRBETRIQ) 25 MG TB24 Take 25 mg by mouth 2 (two) times daily.     . Misc Natural Products (TART CHERRY ADVANCED PO) Take 2 tablets by mouth.    . nitrofurantoin, macrocrystal-monohydrate, (MACROBID) 100 MG capsule Take 100 mg by mouth at bedtime.     . polyethylene glycol (MIRALAX / GLYCOLAX) packet Take 17 g by mouth daily. 14 each 0  . rosuvastatin (CRESTOR) 10 MG tablet TAKE 1 TABLET DAILY 90 tablet 3  . selegiline (ELDEPRYL) 5 MG capsule TAKE 1 CAPSULE TWICE DAILY BEFORE MEALS 180 capsule 3  . tamsulosin (FLOMAX) 0.4 MG CAPS capsule  TAKE 1 CAPSULE DAILY 90 capsule 3  . TURMERIC PO Take by mouth.     No current facility-administered medications for this visit.     Objective: BP 132/76 (BP Location: Left Arm, Patient Position: Sitting, Cuff Size: Large)   Pulse 70   Temp 97.6 F (36.4 C) (Oral)   Ht 5\' 9"  (1.753 m)   Wt 231 lb (104.8 kg)   SpO2 97%   BMI 34.11 kg/m  Gen: NAD, resting comfortably CV: RRR no murmurs rubs or gallops Lungs: CTAB no crackles, wheeze, rhonchi Abdomen: soft/nontender/nondistended/normal bowel sounds.obese Ext: 1+ edema right, 2+ left Skin: warm, dry Neuro: leans to right, slow shuffling gait with walker  Assessment/Plan:  Lingering mild discomfort in tailbone area from fall a few weeks  ago. Mobility improving. Pain minimial. Tripped when trying to pull back from table- getting stuck between chairs of legs. [facility] had him meet for getting set up with customized wheelchair- will reduce fall risk hopefully- Dr. Carles Collet will give final approval if she agrees per patient. Long walks particularly tough for him.   Check BMET with mobic- really helps him especially since narcotics cause intolerable constipation  Chronic diastolic heart failure S: weight down 4 lbs from last visit. Edema stable but severe. On lasix 40mg  BID and wearing compression stockings.  A/P: continue current meds- appears stable  Essential hypertension S: controlled on  lasix 40mg  BID.   BP Readings from Last 3 Encounters:  10/16/17 132/76  10/04/17 (!) 195/96- was on automatic cuff per family after long walk  09/06/17 (!) 144/77  A/P: We discussed blood pressure goal of <140/90. Continue current meds. Had discussed ace-I if BP remained up  Hyperglycemia S: has lost 4 lbs from last visit Lab Results  Component Value Date   HGBA1C 6.1 04/20/2017  A/P: discussed importance continued weight loss. Mobility limited so has to contiue to eat better. Update a1c today  Hyperlipidemia S: well controlled on crestor 10mg  with last LDL 12/19/16 at 32. No myalgias.   A/P: next time fasting plan to check full lipids, LDL at least at goal    Future Appointments  Date Time Provider [place]  10/27/2017  1:00 PM Tat, Eustace Quail, DO [facility] None  10/30/2017  2:00 PM [facility] NURSE [facility] None  01/03/2018  1:00 PM Meredith Staggers, MD [facility] CPR  01/22/2018  1:45 PM Marin Olp, MD [facility] None  01/25/2018  3:00 PM Tat, Eustace Quail, DO [facility] None   Return in about 4 months (around 02/13/2018).  Orders Placed This Encounter  Procedures  . CBC    [place]  . Comprehensive metabolic panel    [place]  . Hemoglobin A1c       Return precautions advised.  [name], MD

## 2017-10-16 NOTE — Assessment & Plan Note (Signed)
S: weight down 4 lbs from last visit. Edema stable but severe. On lasix 40mg  BID and wearing compression stockings.  A/P: continue current meds- appears stable

## 2017-10-16 NOTE — Assessment & Plan Note (Signed)
S: controlled on  lasix 40mg  BID.   BP Readings from Last 3 Encounters:  10/16/17 132/76  10/04/17 (!) 195/96- was on automatic cuff per family after long walk  09/06/17 (!) 144/77  A/P: We discussed blood pressure goal of <140/90. Continue current meds. Had discussed ace-I if BP remained up

## 2017-10-16 NOTE — Patient Instructions (Addendum)
Please stop by lab before you go  No changes today   

## 2017-10-17 LAB — CBC
HEMATOCRIT: 39.6 % (ref 39.0–52.0)
HEMOGLOBIN: 12.8 g/dL — AB (ref 13.0–17.0)
MCHC: 32.3 g/dL (ref 30.0–36.0)
MCV: 86.3 fl (ref 78.0–100.0)
Platelets: 194 10*3/uL (ref 150.0–400.0)
RBC: 4.6 Mil/uL (ref 4.22–5.81)
RDW: 15.3 % (ref 11.5–15.5)
WBC: 5 10*3/uL (ref 4.0–10.5)

## 2017-10-17 LAB — COMPREHENSIVE METABOLIC PANEL
ALT: 6 U/L (ref 0–53)
AST: 17 U/L (ref 0–37)
Albumin: 4.2 g/dL (ref 3.5–5.2)
Alkaline Phosphatase: 87 U/L (ref 39–117)
BUN: 28 mg/dL — ABNORMAL HIGH (ref 6–23)
CHLORIDE: 99 meq/L (ref 96–112)
CO2: 33 meq/L — AB (ref 19–32)
Calcium: 9.6 mg/dL (ref 8.4–10.5)
Creatinine, Ser: 1.01 mg/dL (ref 0.40–1.50)
GFR: 75.2 mL/min (ref >60.00)
GLUCOSE: 112 mg/dL — AB (ref 70–99)
POTASSIUM: 4.6 meq/L (ref 3.5–5.1)
Sodium: 140 mEq/L (ref 135–145)
Total Bilirubin: 0.6 mg/dL (ref 0.2–1.2)
Total Protein: 7.3 g/dL (ref 6.0–8.3)

## 2017-10-17 LAB — HEMOGLOBIN A1C: Hgb A1c MFr Bld: 5.9 % (ref 4.6–6.5)
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
## 2017-10-27 ENCOUNTER — Ambulatory Visit (INDEPENDENT_AMBULATORY_CARE_PROVIDER_SITE_OTHER): Payer: Medicare Other | Admitting: Neurology

## 2017-10-27 ENCOUNTER — Encounter: Payer: Self-pay | Admitting: Neurology

## 2017-10-27 VITALS — BP 140/60 | HR 88 | Ht 69.0 in | Wt 232.0 lb

## 2017-10-27 DIAGNOSIS — E538 Deficiency of other specified B group vitamins: Secondary | ICD-10-CM | POA: Diagnosis not present

## 2017-10-27 DIAGNOSIS — G4733 Obstructive sleep apnea (adult) (pediatric): Secondary | ICD-10-CM

## 2017-10-27 DIAGNOSIS — G20 Parkinson's disease: Secondary | ICD-10-CM

## 2017-10-27 MED ORDER — CYANOCOBALAMIN 1000 MCG/ML IJ SOLN
1000.0000 ug | Freq: Once | INTRAMUSCULAR | Status: AC
  Administered 2017-10-27: 1000 ug via INTRAMUSCULAR

## 2017-10-27 NOTE — Addendum Note (Signed)
Addended byAnnamaria Helling on: 10/27/2017 02:17 PM   Modules accepted: Orders

## 2017-10-30 ENCOUNTER — Ambulatory Visit: Payer: Medicare Other

## 2017-11-08 ENCOUNTER — Telehealth: Payer: Self-pay | Admitting: *Deleted

## 2017-11-08 NOTE — Telephone Encounter (Signed)
Patients wife called and left a message that the pharmacy told her that the patients meloxicam was discontinued.  She wants to know why?

## 2017-11-08 NOTE — Telephone Encounter (Signed)
Called [facility] pharmacy, the tech I spoke with on the phone did state that for some reason that the patients medication was deactivated.  Stated it was some Teaching laboratory technician.  Spoke with pharmacist and had the medication reordered with 2 refills to make up the difference.

## 2017-11-08 NOTE — Telephone Encounter (Signed)
I have no idea why. Perhaps someone could tell me?

## 2017-11-24 ENCOUNTER — Ambulatory Visit: Payer: Medicare Other

## 2017-11-27 ENCOUNTER — Ambulatory Visit: Payer: Medicare Other

## 2017-11-29 ENCOUNTER — Ambulatory Visit (INDEPENDENT_AMBULATORY_CARE_PROVIDER_SITE_OTHER): Payer: Medicare Other | Admitting: *Deleted

## 2017-11-29 DIAGNOSIS — E538 Deficiency of other specified B group vitamins: Secondary | ICD-10-CM

## 2017-11-29 MED ORDER — CYANOCOBALAMIN 1000 MCG/ML IJ SOLN
1000.0000 ug | Freq: Once | INTRAMUSCULAR | Status: AC
  Administered 2017-11-29: 1000 ug via INTRAMUSCULAR

## 2017-12-07 ENCOUNTER — Other Ambulatory Visit: Payer: Self-pay

## 2017-12-07 MED ORDER — POTASSIUM CHLORIDE CRYS ER 20 MEQ PO TBCR: 20.0000 meq | EXTENDED_RELEASE_TABLET | Freq: Two times a day (BID) | ORAL | 0 refills | Status: DC

## 2017-12-15 ENCOUNTER — Other Ambulatory Visit: Payer: Self-pay

## 2017-12-15 MED ORDER — ALLOPURINOL 100 MG PO TABS: 100.0000 mg | ORAL_TABLET | Freq: Every day | ORAL | 1 refills | Status: AC

## 2017-12-17 ENCOUNTER — Other Ambulatory Visit: Payer: Self-pay | Admitting: Family Medicine

## 2017-12-17 ENCOUNTER — Other Ambulatory Visit: Payer: Self-pay | Admitting: Neurology

## 2017-12-20 ENCOUNTER — Telehealth: Payer: Self-pay | Admitting: Neurology

## 2017-12-20 NOTE — Telephone Encounter (Signed)
Patient wife called and needs to talk to someone about maybe getting help with the wheelchair. She has been told that they cant put the wheelchair on the car and she wants to ask some questions

## 2017-12-20 NOTE — Telephone Encounter (Signed)
Spoke with patient's wife. They are having trouble with transport of new power wheelchair and finding a vehicle. I spoke with Jackelyn Poling who is the wheelchair specialist at [facility]. She will reach out to patient to see if she can help.

## 2017-12-29 ENCOUNTER — Ambulatory Visit (INDEPENDENT_AMBULATORY_CARE_PROVIDER_SITE_OTHER): Payer: Medicare Other | Admitting: Neurology

## 2017-12-29 DIAGNOSIS — E538 Deficiency of other specified B group vitamins: Secondary | ICD-10-CM | POA: Diagnosis not present

## 2017-12-29 MED ORDER — CYANOCOBALAMIN 1000 MCG/ML IJ SOLN
1000.0000 ug | Freq: Once | INTRAMUSCULAR | Status: AC
Start: 2017-12-29 — End: 2017-12-29
  Administered 2017-12-29: 1000 ug via INTRAMUSCULAR

## 2017-12-29 NOTE — Progress Notes (Signed)
B12 injection to left deltoid with no apparent complications.   

## 2018-01-01 DIAGNOSIS — Z125 Encounter for screening for malignant neoplasm of prostate: Secondary | ICD-10-CM | POA: Diagnosis not present

## 2018-01-01 DIAGNOSIS — N3281 Overactive bladder: Secondary | ICD-10-CM | POA: Diagnosis not present

## 2018-01-01 DIAGNOSIS — N401 Enlarged prostate with lower urinary tract symptoms: Secondary | ICD-10-CM | POA: Diagnosis not present

## 2018-01-03 ENCOUNTER — Encounter: Payer: Self-pay | Admitting: Physical Medicine & Rehabilitation

## 2018-01-03 ENCOUNTER — Encounter: Payer: Medicare Other | Attending: Physical Medicine & Rehabilitation | Admitting: Physical Medicine & Rehabilitation

## 2018-01-03 VITALS — BP 176/82 | HR 77 | Resp 14

## 2018-01-03 DIAGNOSIS — G20 Parkinson's disease: Secondary | ICD-10-CM | POA: Diagnosis not present

## 2018-01-03 DIAGNOSIS — M47816 Spondylosis without myelopathy or radiculopathy, lumbar region: Secondary | ICD-10-CM | POA: Diagnosis not present

## 2018-01-03 DIAGNOSIS — R200 Anesthesia of skin: Secondary | ICD-10-CM | POA: Insufficient documentation

## 2018-01-03 DIAGNOSIS — R269 Unspecified abnormalities of gait and mobility: Secondary | ICD-10-CM | POA: Insufficient documentation

## 2018-01-03 DIAGNOSIS — M1711 Unilateral primary osteoarthritis, right knee: Secondary | ICD-10-CM | POA: Diagnosis not present

## 2018-01-03 DIAGNOSIS — G4730 Sleep apnea, unspecified: Secondary | ICD-10-CM | POA: Insufficient documentation

## 2018-01-03 DIAGNOSIS — M65342 Trigger finger, left ring finger: Secondary | ICD-10-CM | POA: Insufficient documentation

## 2018-01-03 DIAGNOSIS — I4510 Unspecified right bundle-branch block: Secondary | ICD-10-CM | POA: Insufficient documentation

## 2018-01-03 DIAGNOSIS — M1611 Unilateral primary osteoarthritis, right hip: Secondary | ICD-10-CM | POA: Diagnosis not present

## 2018-01-03 DIAGNOSIS — I10 Essential (primary) hypertension: Secondary | ICD-10-CM | POA: Insufficient documentation

## 2018-01-03 DIAGNOSIS — M25561 Pain in right knee: Secondary | ICD-10-CM | POA: Insufficient documentation

## 2018-01-03 DIAGNOSIS — Z86711 Personal history of pulmonary embolism: Secondary | ICD-10-CM | POA: Diagnosis not present

## 2018-01-03 DIAGNOSIS — F329 Major depressive disorder, single episode, unspecified: Secondary | ICD-10-CM | POA: Diagnosis not present

## 2018-01-03 DIAGNOSIS — Z86718 Personal history of other venous thrombosis and embolism: Secondary | ICD-10-CM | POA: Diagnosis not present

## 2018-01-03 DIAGNOSIS — G8929 Other chronic pain: Secondary | ICD-10-CM | POA: Diagnosis not present

## 2018-01-03 DIAGNOSIS — E785 Hyperlipidemia, unspecified: Secondary | ICD-10-CM | POA: Diagnosis not present

## 2018-01-03 DIAGNOSIS — R531 Weakness: Secondary | ICD-10-CM | POA: Insufficient documentation

## 2018-01-03 DIAGNOSIS — M109 Gout, unspecified: Secondary | ICD-10-CM | POA: Diagnosis not present

## 2018-01-03 DIAGNOSIS — M6530 Trigger finger, unspecified finger: Secondary | ICD-10-CM

## 2018-01-03 DIAGNOSIS — Z96641 Presence of right artificial hip joint: Secondary | ICD-10-CM | POA: Diagnosis not present

## 2018-01-03 NOTE — Patient Instructions (Signed)
PLEASE FEEL FREE TO CALL OUR OFFICE WITH ANY PROBLEMS OR QUESTIONS ([phone])      

## 2018-01-03 NOTE — Progress Notes (Signed)
Subjective:    Patient ID: Walter Gill, male    DOB: 08-Jul-1934, 84 y.o.   MRN: [id number]  HPI  Walter Gill is here in follow-up of his chronic pain and gait disorder.  At last visit we performed trigger finger injection to his right hand and has had good results with this.  Now the fourth finger on his left hand is acting up a little bit but not nearly to the point the other side was.  He is having more more right knee pain.  He is now working on a Printmaker to accommodate his wheelchair.  They have been frustrated by the process somewhat but are moving forward.  He also had some house modifications done to allow them easier access into the bathroom.  He feels that the meloxicam still provides some relief.  He is using the supplements I recommended as well.  He tells me that his family physician is checking regular lab work including kidney function, liver function and CBCs.  Currently he is using a rolling walker for his basic mobility but is often very fatigued by the time he has ambulated into destinations in the community.    Pain Inventory Average Pain 5 Pain Right Now 5 My pain is sharp and stabbing  In the last 24 hours, has pain interfered with the following? General activity 2 Relation with others 2 Enjoyment of life 4 What TIME of day is your pain at its worst? daytime Sleep (in general) Fair  Pain is worse with: walking, inactivity and standing Pain improves with: rest, heat/ice, medication and injections Relief from Meds: 3  Mobility walk without assistance use a walker how many minutes can you walk? 5 ability to climb steps?  no do you drive?  no use a wheelchair needs help with transfers transfers alone  Function retired I need assistance with the following:  meal prep, household duties and shopping  Neuro/Psych bladder control problems bowel control problems weakness numbness tingling trouble walking spasms depression  Prior Studies Any changes since  last visit?  no  Physicians involved in your care Any changes since last visit?  no   Family History  Problem Relation Age of Onset  . Heart disease Mother        had murmur, never went to doctor  . Cancer Father        possible gi   Social History   Socioeconomic History  . Marital status: Married    Spouse name: None  . Number of children: None  . Years of education: None  . Highest education level: None  Social Needs  . Financial resource strain: None  . Food insecurity - worry: None  . Food insecurity - inability: None  . Transportation needs - medical: None  . Transportation needs - non-medical: None  Occupational History  . Occupation: retired Buyer, retail: RETIRED  Tobacco Use  . Smoking status: Never Smoker  . Smokeless tobacco: Never Used  Substance and Sexual Activity  . Alcohol use: No  . Drug use: No  . Sexual activity: None  Other Topics Concern  . None  Social History Narrative   Married. 3 children. 6 grandkids and 1 stepgrandson. No greatgrandkids.       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 [place] [employer]. [employer].       Past Surgical History:  Procedure Laterality  Date  . APPENDECTOMY    . CATARACT EXTRACTION     bilateral  . CHOLECYSTECTOMY    . dupruytens contracture     left  . EYE SURGERY     ? for lid lag vs apraxia of eye opening  . pilonidal cyst removal    . TOTAL HIP ARTHROPLASTY  9/8/8   right   Past Medical History:  Diagnosis Date  . DVT (deep venous thrombosis) ([place])   . Gout   . Hyperlipidemia   . Hypertension   . HYPERTENSION 07/02/2007   Qualifier: Diagnosis of  By: Leanne Chang MD, Bruce    . Left ventricular dysfunction    impaired LV relaxation  . OA (osteoarthritis)   . Parkinson disease ([place])   . PE (pulmonary embolism) september 2008  . RBBB (right bundle branch block)   . Sleep apnea    BP (!) 176/82 (BP  Location: Right Arm, Patient Position: Sitting, Cuff Size: Normal)   Pulse 77   Resp 14   SpO2 97%   Opioid Risk Score:   Fall Risk Score:  `1  Depression screen PHQ 2/9  Depression screen [facility] 2/9 04/20/2017 03/22/2017 03/17/2016 01/14/2015 04/17/2013  Decreased Interest 0 0 0 0 0  Down, Depressed, Hopeless 0 2 0 0 0  PHQ - 2 Score 0 2 0 0 0  Altered sleeping - 0 - - -  Tired, decreased energy - 1 - - -  Change in appetite - 0 - - -  Feeling bad or failure about yourself  - 1 - - -  Trouble concentrating - 0 - - -  Moving slowly or fidgety/restless - 3 - - -  Suicidal thoughts - 0 - - -  PHQ-9 Score - 7 - - -  Difficult doing work/chores - Somewhat difficult - - -  Some recent data might be hidden    Review of Systems  HENT: Negative.   Eyes: Negative.   Respiratory: Negative.   Cardiovascular: Negative.   Gastrointestinal: Positive for constipation.  Endocrine: Negative.   Genitourinary: Positive for difficulty urinating.  Musculoskeletal: Positive for gait problem.       Spasms  Neurological: Positive for weakness and numbness.       Tingling  All other systems reviewed and are negative.      Objective:   Physical Exam  General: Alert and oriented x 3, No apparent distress. obese HEENT:Head is normocephalic, atraumatic, PERRLA, EOMI, sclera anicteric, oral mucosa pink and moist, dentition intact, ext ear canals clear,  Neck:Supple without JVD or lymphadenopathy Heart:Regular rate chest:Clear with normal effort. Abdomen:Soft, non-tender, non-distended, bowel sounds positive. Extremities:No clubbing, cyanosis, or edema. Pulses are 2+ Skin:Clean and intact without signs of breakdown Neuro:Pt is cognitively appropriate with normal insight, memory, and awareness. Cranial nerves 2-12 are intact. ?decreased LT in median distribution of either hands. . Reflexes are tr to absentin all 4's. Fine motor coordination is decreased. Periodic resting tremor is noted as well as  occasional jerking movements of left arm. Does have some mild resting rigidity in both legs and arms. Motor function is grossly 4-5/5 in the upper extremities and 3+ to 4/5 HF, RKE 4-/5, LKE 4/5. ADF/PF 4-5/5 bilaterallyMotor function is grossly 5/5. There is pain inhibition with use of RLE. Right knee "tight".  Neuro exam otherwise stable.   Gait remains slightly shuffling Musculoskeletal:Still difficulty with full extension of the right lower extremity.  Knee tends to remain flexed at about 10-20 degrees.  He is antalgic on  this leg as well.  He is a he is experiencing crepitus with passive and active movement.  He has pain with palpation along the medial lateral joint line and notable valgus to deformity is seen at the right knee.  Right hand seems to be moving fairly freely.  No obvious catch is seen in the left hand today. Psych:Pt's affect is appropriate. Pt is cooperative and quite pleasant      ASSESSMENT/PLAN  1. Parkinson's disease 2. 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 There is certainly underlying tightness in core muscle mechanism although this is not true muscle spasticity 3. Endstage OA of right knee as above (lateral compartment most affected) 4. Trigger finger left 4th finger.  Left fourth finger now with slight involvement also 5. CTS RUE   Plan:  1. He shall continue with Knee ROM and strengthening program as I provided.  The less impact he puts on the knee the better he will feel.  I think using the wheelchair more often is the best route for him from a safety and conservation standpoint.   2.  Do not see a need to repeat Botox injection at this point as I am unsure if provided substantial benefit 4. Continuelow dose meloxicam, 7.5mg  daily with food.   continue to monitor renal function,LFT's, cbc closely.  Maintain joint supplements as I have described him in the past 5. Recommend neutral wrist splint RUE at night and possible during the day.  He would also benefit from a splint left upper extremity as well.  Splints that encompassed the digits would help keep fingers stretched and reduce trigger finger episodes  .    15 minutes of face to face patient care time were spent during this visit. All questions were encouraged and answered.  Greater than 50% of the time during this encounter was spent discussing treatment options, mobility options, orthotics and home exercise program.

## 2018-01-04 ENCOUNTER — Other Ambulatory Visit: Payer: Self-pay | Admitting: Physical Medicine & Rehabilitation

## 2018-01-04 DIAGNOSIS — M1711 Unilateral primary osteoarthritis, right knee: Secondary | ICD-10-CM

## 2018-01-04 DIAGNOSIS — M47816 Spondylosis without myelopathy or radiculopathy, lumbar region: Secondary | ICD-10-CM

## 2018-01-22 ENCOUNTER — Encounter: Payer: Self-pay | Admitting: Family Medicine

## 2018-01-22 ENCOUNTER — Ambulatory Visit (INDEPENDENT_AMBULATORY_CARE_PROVIDER_SITE_OTHER): Payer: Medicare Other | Admitting: Family Medicine

## 2018-01-22 VITALS — BP 136/82 | HR 65 | Temp 97.5°F | Ht 69.0 in | Wt 234.8 lb

## 2018-01-22 DIAGNOSIS — E785 Hyperlipidemia, unspecified: Secondary | ICD-10-CM | POA: Diagnosis not present

## 2018-01-22 DIAGNOSIS — I5032 Chronic diastolic (congestive) heart failure: Secondary | ICD-10-CM

## 2018-01-22 DIAGNOSIS — J069 Acute upper respiratory infection, unspecified: Secondary | ICD-10-CM | POA: Diagnosis not present

## 2018-01-22 DIAGNOSIS — M48061 Spinal stenosis, lumbar region without neurogenic claudication: Secondary | ICD-10-CM | POA: Diagnosis not present

## 2018-01-22 DIAGNOSIS — R739 Hyperglycemia, unspecified: Secondary | ICD-10-CM | POA: Diagnosis not present

## 2018-01-22 NOTE — Assessment & Plan Note (Signed)
S: Patient remains on mobic long term for chronic pain. We have had to watch this due to age/kidney function.  A/P: Luckily GFR has remained above 70 though BUN shows mild dehydration.

## 2018-01-22 NOTE — Progress Notes (Signed)
Subjective:  Walter Gill is a 84 y.o. year old very pleasant male patient who presents for/with See problem oriented charting ROS- No chest pain. Has had cough and congestion. No headache or blurry vision.  Lesion on left forehead area growing.   Past Medical History-  Patient Active Problem List   Diagnosis Date Noted  . Spinal stenosis of lumbar region 08/01/2015    Priority: High  . Chronic diastolic heart failure ([place]) 10/29/2014    Priority: High  . Chronic pain syndrome 10/01/2014    Priority: High  . PARKINSON'S DISEASE 07/02/2007    Priority: High  . Hyperglycemia 03/17/2016    Priority: Medium  . Urinary incontinence 01/14/2015    Priority: Medium  . Hyperlipidemia 10/29/2014    Priority: Medium  . Constipation 10/24/2014    Priority: Medium  . OSA on CPAP 04/17/2013    Priority: Medium  . DVT, HX OF 10/16/2007    Priority: Medium  . Gout 07/02/2007    Priority: Medium  . Essential hypertension 07/02/2007    Priority: Medium  . B12 deficiency 06/06/2013    Priority: Low  . REM behavioral disorder 10/25/2012    Priority: Low  . PULMONARY EMBOLISM 10/08/2007    Priority: Low  . RBBB 07/02/2007    Priority: Low  . Primary localized osteoarthritis of right knee 07/02/2007    Priority: Low  . Trigger finger, acquired 08/02/2017  . Spastic hemiparesis affecting dominant side ([place]) 05/31/2017  . Spondylosis of lumbar region without myelopathy or radiculopathy 03/22/2017  . Posture abnormality 03/22/2017    Medications- reviewed and updated Current Outpatient Medications  Medication Sig Dispense Refill  . acetaminophen (TYLENOL) 650 MG CR tablet Take two 650 mg Tabs twice daily for pain 60 tablet 0  . allopurinol (ZYLOPRIM) 100 MG tablet Take 1 tablet (100 mg total) by mouth daily. 90 tablet 1  . aspirin 81 MG chewable tablet Chew 81 mg by mouth at bedtime.     . bisacodyl (DULCOLAX) 5 MG EC tablet Take 2 tablets (10 mg total) by mouth every morning. 30  tablet 0  . carbidopa-levodopa (SINEMET CR) 50-200 MG tablet TAKE 1 TABLET 5 TIMES A DAY 450 tablet 1  . cholecalciferol (VITAMIN D) 1000 units tablet Take 1,000 Units by mouth daily.    . furosemide (LASIX) 40 MG tablet TAKE 1 TABLET BY MOUTH TWICE A DAY 180 tablet 3  . glucosamine-chondroitin 500-400 MG tablet Take 1 tablet by mouth 2 (two) times daily.    . Magnesium Oxide 500 MG TABS Take 500 mg by mouth 2 (two) times daily.    . meloxicam (MOBIC) 7.5 MG tablet Take 1 tablet (7.5 mg total) by mouth daily. With food 30 tablet 1  . Mirabegron ER (MYRBETRIQ) 25 MG TB24 Take 25 mg by mouth 2 (two) times daily.     . Misc Natural Products (TART CHERRY ADVANCED PO) Take 2 tablets by mouth.    . nitrofurantoin, macrocrystal-monohydrate, (MACROBID) 100 MG capsule Take 100 mg by mouth at bedtime.     . polyethylene glycol (MIRALAX / GLYCOLAX) packet Take 17 g by mouth daily. 14 each 0  . potassium chloride SA (KLOR-CON M20) 20 MEQ tablet Take 1 tablet (20 mEq total) by mouth 2 (two) times daily. 180 tablet 0  . rosuvastatin (CRESTOR) 10 MG tablet TAKE 1 TABLET DAILY 90 tablet 3  . selegiline (ELDEPRYL) 5 MG capsule TAKE 1 CAPSULE TWICE DAILY BEFORE MEALS 180 capsule 3  . tamsulosin (FLOMAX)  0.4 MG CAPS capsule TAKE 1 CAPSULE DAILY 90 capsule 3  . TURMERIC PO Take by mouth.     No current facility-administered medications for this visit.     Objective: BP 136/82 (BP Location: Left Arm, Patient Position: Sitting, Cuff Size: Large)   Pulse 65   Temp (!) 97.5 F (36.4 C) (Oral)   Ht 5\' 9"  (1.753 m)   Wt 234 lb 12.8 oz (106.5 kg)   SpO2 98%   BMI 34.67 kg/m  Gen: NAD, resting comfortably CV: RRR no murmurs rubs or gallops Lungs: CTAB no crackles, wheeze, rhonchi Abdomen: soft/nontender/nondistended/normal bowel sounds.obese Ext: stable 2+ edema under compression stockings Skin: warm, dry, left forehead- at least 1 cm raised lesion slightly darker than skin tone  Assessment/Plan:  Other  issues 1. Reports no more tailbone pain 2. Wife to take husband to dermatology for growth on face- discussed could biopsy but they prefer likely higher quality cosmetic result.  3. Has electric chair- hassle to get it set up to be carried in car through [place] but now doing better  Acute URI S:  Has been taking corricidin and mucinex. He has been coughing with clear thick sputum. White discharge from nose or runny nose. Felt really fatigued. Started about 8 days ago.  Some chest congestion. no wheeze. No shortness of breath. No fever.  Wife has been ill as well A/P: overall improvign- discussed continued symptomatic care  Hypertension S: controlled on  lasix 40mg  BID BP Readings from Last 3 Encounters:  01/22/18 136/82  01/03/18 (!) 176/82  10/27/17 140/60  A/P: We discussed blood pressure goal of <140/90. Continue current meds  Spinal stenosis of lumbar region S: Patient remains on mobic long term for chronic pain. We have had to watch this due to age/kidney function.  A/P: Luckily GFR has remained above 70 though BUN shows mild dehydration.   Chronic diastolic heart failure S:  Weight up 3 lbs from last visit. Edema remains severe. On lasix 40mg  BID nad wears compression stockings. Edema strongly contributed to by immobility and venous insufficiency as well A/P: continue current lasix dose- update bmet next visit  Hyperglycemia S: weight up 3 lbs this visit but down 1 lb overall comparing last 2 visits.  Lab Results  Component Value Date   HGBA1C 5.9 10/16/2017  A/P: continue to work on weight loss and update a1c at next visit  Hyperlipidemia S: controlled on 10mg  crestor - no recent full labs. No myalgias.  Lab Results  Component Value Date   CHOL 96 10/04/2013   HDL 36.70 (L) 10/04/2013   LDLCALC 27 10/04/2013   LDLDIRECT 32.0 12/19/2016   TRIG 160.0 (H) 10/04/2013   CHOLHDL 3 10/04/2013   A/P: we discussed possibly reducing to 5mg - they just picked up full  bottle so they suggested making change next visit instead. Hopefully can at some point catch him fasting   Future Appointments  Date Time Provider [place]  01/29/2018  2:00 PM [facility] NURSE [facility] None  02/22/2018  3:30 PM Tat, Eustace Quail, DO [facility] None  05/02/2018  1:00 PM Meredith Staggers, MD [facility] CPR  05/24/2018  1:30 PM Marin Olp, MD [facility]    Return precautions advised.  [name], MD

## 2018-01-22 NOTE — Assessment & Plan Note (Signed)
S: controlled on 10mg  crestor - no recent full labs. No myalgias.  Lab Results  Component Value Date   CHOL 96 10/04/2013   HDL 36.70 (L) 10/04/2013   LDLCALC 27 10/04/2013   LDLDIRECT 32.0 12/19/2016   TRIG 160.0 (H) 10/04/2013   CHOLHDL 3 10/04/2013   A/P: we discussed possibly reducing to 5mg - they just picked up full bottle so they suggested making change next visit instead. Hopefully can at some point catch him fasting

## 2018-01-22 NOTE — Assessment & Plan Note (Signed)
S: weight up 3 lbs this visit but down 1 lb overall comparing last 2 visits.  Lab Results  Component Value Date   HGBA1C 5.9 10/16/2017  A/P: continue to work on weight loss and update a1c at next visit

## 2018-01-22 NOTE — Assessment & Plan Note (Signed)
S:  Weight up 3 lbs from last visit. Edema remains severe. On lasix 40mg  BID nad wears compression stockings. Edema strongly contributed to by immobility and venous insufficiency as well A/P: continue current lasix dose- update bmet next visit

## 2018-01-22 NOTE — Patient Instructions (Signed)
No changes today other than making sure not to gain more weight.   Everything else looks stable- lets hold off on labs until next visit.   See you both in about 4 months

## 2018-01-25 ENCOUNTER — Ambulatory Visit: Payer: Medicare Other | Admitting: Neurology

## 2018-01-29 ENCOUNTER — Ambulatory Visit: Payer: Medicare Other
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
## 2018-02-22 ENCOUNTER — Ambulatory Visit (INDEPENDENT_AMBULATORY_CARE_PROVIDER_SITE_OTHER): Payer: Medicare Other | Admitting: Neurology

## 2018-02-22 ENCOUNTER — Encounter: Payer: Self-pay | Admitting: Neurology

## 2018-02-22 VITALS — BP 130/88 | HR 70

## 2018-02-22 DIAGNOSIS — E538 Deficiency of other specified B group vitamins: Secondary | ICD-10-CM

## 2018-02-22 DIAGNOSIS — G20 Parkinson's disease: Secondary | ICD-10-CM

## 2018-02-22 MED ORDER — CYANOCOBALAMIN 1000 MCG/ML IJ SOLN
1000.0000 ug | Freq: Once | INTRAMUSCULAR | Status: AC
Start: 2018-02-22 — End: 2018-02-22
  Administered 2018-02-22: 1000 ug via INTRAMUSCULAR

## 2018-02-22 NOTE — Patient Instructions (Signed)
   [facility] [place] [facility]   This new group will be for people who are living with PD and/or their caregiver's who live in the [facility] area.  This professionally led support group will provide a safe place for people living with Parkinson's Disease to connect, share challenges, seek solutions, learn about resources and  strengthen coping skills.    The 3rd Monday of the Month from 2-3:30.                 2019 Dates: 3/18, 4/15, 5/20, 6/17, 7/15, 8/19, 9/16, 10/21, 11/18, 12/16  Location:  [facility]  [address] [place], [place] [address]  Please call Myra Gianotti, MSW, LCSW at [phone] with any questions.   [facility] [employer] [facility]  The [place] team know that living well with a movement disorder extends far beyond our clinic walls. We are together with you. Our team is passionate about providing resources to you and your loved ones who are living with Parkinson's disease and movement disorders. Participate in these programs and join our community. These resources are free or low cost!   [facility] is adding:   Innovative educational programs for patients and caregivers.   Support groups for patients and caregivers living with Parkinson's disease.   Parkinson's specific exercise programs.   Custom tailored therapeutic programs that will benefit patient's living with Parkinson's disease.   We are in this together. You can help and contribute to grow these programs and resources in our community. 100% of the funds donated to the [facility] stays right here in our community to support patients and their caregivers.  To make a tax deductible contribution:  -ask for a [facility] Parkinson's envelope in the office  today.  - call the Office of Institutional Advancement at [phone].           Start taking B12 - 1079mcg daily.

## 2018-02-23 ENCOUNTER — Telehealth: Payer: Self-pay | Admitting: Neurology

## 2018-02-23 NOTE — Telephone Encounter (Signed)
Spoke with Andria Rhein, [facility], they will reach out to patient directly to come adjust sensitivity on power wheelchair.

## 2018-03-01 ENCOUNTER — Other Ambulatory Visit: Payer: Self-pay | Admitting: Physical Medicine & Rehabilitation

## 2018-03-01 DIAGNOSIS — M1711 Unilateral primary osteoarthritis, right knee: Secondary | ICD-10-CM

## 2018-03-01 DIAGNOSIS — M47816 Spondylosis without myelopathy or radiculopathy, lumbar region: Secondary | ICD-10-CM

## 2018-03-08 ENCOUNTER — Other Ambulatory Visit: Payer: Self-pay | Admitting: Family Medicine

## 2018-03-09 ENCOUNTER — Other Ambulatory Visit: Payer: Self-pay

## 2018-03-09 DIAGNOSIS — I5032 Chronic diastolic (congestive) heart failure: Secondary | ICD-10-CM

## 2018-03-09 MED ORDER — FUROSEMIDE 40 MG PO TABS: 40.0000 mg | ORAL_TABLET | Freq: Two times a day (BID) | ORAL | 3 refills | Status: AC

## 2018-04-30 ENCOUNTER — Other Ambulatory Visit: Payer: Self-pay | Admitting: Physical Medicine & Rehabilitation

## 2018-04-30 DIAGNOSIS — M1711 Unilateral primary osteoarthritis, right knee: Secondary | ICD-10-CM

## 2018-04-30 DIAGNOSIS — M47816 Spondylosis without myelopathy or radiculopathy, lumbar region: Secondary | ICD-10-CM

## 2018-05-02 ENCOUNTER — Encounter: Payer: Medicare Other | Attending: Physical Medicine & Rehabilitation | Admitting: Physical Medicine & Rehabilitation

## 2018-05-02 ENCOUNTER — Encounter: Payer: Self-pay | Admitting: Physical Medicine & Rehabilitation

## 2018-05-02 VITALS — BP 150/78 | HR 72 | Resp 14 | Ht 69.0 in | Wt 234.0 lb

## 2018-05-02 DIAGNOSIS — M6530 Trigger finger, unspecified finger: Secondary | ICD-10-CM

## 2018-05-02 DIAGNOSIS — M1711 Unilateral primary osteoarthritis, right knee: Secondary | ICD-10-CM | POA: Diagnosis not present

## 2018-05-02 DIAGNOSIS — G5601 Carpal tunnel syndrome, right upper limb: Secondary | ICD-10-CM | POA: Diagnosis not present

## 2018-05-02 DIAGNOSIS — Z8249 Family history of ischemic heart disease and other diseases of the circulatory system: Secondary | ICD-10-CM | POA: Diagnosis not present

## 2018-05-02 DIAGNOSIS — R293 Abnormal posture: Secondary | ICD-10-CM

## 2018-05-02 DIAGNOSIS — Z9049 Acquired absence of other specified parts of digestive tract: Secondary | ICD-10-CM | POA: Diagnosis not present

## 2018-05-02 DIAGNOSIS — Z96641 Presence of right artificial hip joint: Secondary | ICD-10-CM | POA: Diagnosis not present

## 2018-05-02 DIAGNOSIS — R269 Unspecified abnormalities of gait and mobility: Secondary | ICD-10-CM | POA: Diagnosis not present

## 2018-05-02 DIAGNOSIS — Z9842 Cataract extraction status, left eye: Secondary | ICD-10-CM | POA: Diagnosis not present

## 2018-05-02 DIAGNOSIS — Z9841 Cataract extraction status, right eye: Secondary | ICD-10-CM | POA: Diagnosis not present

## 2018-05-02 DIAGNOSIS — G20 Parkinson's disease: Secondary | ICD-10-CM | POA: Insufficient documentation

## 2018-05-02 DIAGNOSIS — M65342 Trigger finger, left ring finger: Secondary | ICD-10-CM | POA: Diagnosis not present

## 2018-05-02 DIAGNOSIS — G4730 Sleep apnea, unspecified: Secondary | ICD-10-CM | POA: Insufficient documentation

## 2018-05-02 DIAGNOSIS — M25561 Pain in right knee: Secondary | ICD-10-CM | POA: Diagnosis not present

## 2018-05-02 DIAGNOSIS — M47816 Spondylosis without myelopathy or radiculopathy, lumbar region: Secondary | ICD-10-CM | POA: Diagnosis not present

## 2018-05-02 DIAGNOSIS — Z86711 Personal history of pulmonary embolism: Secondary | ICD-10-CM | POA: Insufficient documentation

## 2018-05-02 NOTE — Patient Instructions (Signed)
REMEMBER TO STRETCH HAMSTRINGS ON RIGHT LEG.

## 2018-05-02 NOTE — Progress Notes (Signed)
Subjective:    Patient ID: Walter Gill, male    DOB: 1934-10-25, 84 y.o.   MRN: [id number]  HPI   Walter Gill is here in follow up of his chronic gait disorder and knee pain.  He and his wife purchased a accessible Lucianne Lei and he is been able to use his power wheelchair in the community.  This is been helpful for him.  His knee seems to be worsening from a pain standpoint lately.  He feels a lot more clicking and popping.  The knee injection we performed several months ago did provide some relief but it was not long-term.  He is having ongoing numbness in the right hand which affects how he is able to grasp and perform self-care tasks.  He likes to play the drums as well and he has sometimes difficulty holding a stick.  He did not use the brace as we discussed at last visit however.   Pain Inventory Average Pain 4 Pain Right Now 6 My pain is intermittent, sharp, stabbing, tingling and aching  In the last 24 hours, has pain interfered with the following? General activity 5 Relation with others 5 Enjoyment of life 5 What TIME of day is your pain at its worst? daytime Sleep (in general) Fair  Pain is worse with: walking and bending Pain improves with: rest and injections Relief from Meds: 2  Mobility walk with assistance use a walker how many minutes can you walk? 2 ability to climb steps?  no do you drive?  no use a wheelchair transfers alone  Function not employed: date last employed . I need assistance with the following:  meal prep, household duties and shopping  Neuro/Psych bladder control problems bowel control problems weakness numbness trouble walking confusion depression loss of taste or smell  Prior Studies Any changes since last visit?  no  Physicians involved in your care Any changes since last visit?  no   Family History  Problem Relation Age of Onset  . Heart disease Mother        had murmur, never went to doctor  . Cancer Father    possible gi   Social History   Socioeconomic History  . Marital status: Married    Spouse name: Not on file  . Number of children: Not on file  . Years of education: Not on file  . Highest education level: Not on file  Occupational History  . Occupation: retired Buyer, retail: RETIRED  Social Needs  . Financial resource strain: Not on file  . Food insecurity:    Worry: Not on file    Inability: Not on file  . Transportation needs:    Medical: Not on file    Non-medical: Not on file  Tobacco Use  . Smoking status: Never Smoker  . Smokeless tobacco: Never Used  Substance and Sexual Activity  . Alcohol use: No  . Drug use: No  . Sexual activity: Not on file  Lifestyle  . Physical activity:    Days per week: Not on file    Minutes per session: Not on file  . Stress: Not on file  Relationships  . Social connections:    Talks on phone: Not on file    Gets together: Not on file    Attends religious service: Not on file    Active member of club or organization: Not on file    Attends meetings of clubs or organizations: Not on file  Relationship status: Not on file  Other Topics Concern  . Not on file  Social History Narrative   Married. 3 children. 6 grandkids and 1 stepgrandson. No greatgrandkids.       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 [place] social [employer]. [employer].       Past Surgical History:  Procedure Laterality Date  . APPENDECTOMY    . CATARACT EXTRACTION     bilateral  . CHOLECYSTECTOMY    . dupruytens contracture     left  . EYE SURGERY     ? for lid lag vs apraxia of eye opening  . pilonidal cyst removal    . TOTAL HIP ARTHROPLASTY  9/8/8   right   Past Medical History:  Diagnosis Date  . DVT (deep venous thrombosis) ([place])   . Gout   . Hyperlipidemia   . Hypertension   . HYPERTENSION 07/02/2007   Qualifier: Diagnosis of  By: Leanne Chang MD, Bruce    .  Left ventricular dysfunction    impaired LV relaxation  . OA (osteoarthritis)   . Parkinson disease ([place])   . PE (pulmonary embolism) september 2008  . RBBB (right bundle branch block)   . Sleep apnea    BP (!) 150/78 (BP Location: Left Arm, Patient Position: Sitting, Cuff Size: Normal)   Pulse 72   Resp 14   Ht 5\' 9"  (1.753 m)   Wt 234 lb (106.1 kg)   SpO2 96%   BMI 34.56 kg/m   Opioid Risk Score:   Fall Risk Score:  `1  Depression screen PHQ 2/9  Depression screen [facility] 2/9 04/20/2017 03/22/2017 03/17/2016 01/14/2015 04/17/2013  Decreased Interest 0 0 0 0 0  Down, Depressed, Hopeless 0 2 0 0 0  PHQ - 2 Score 0 2 0 0 0  Altered sleeping - 0 - - -  Tired, decreased energy - 1 - - -  Change in appetite - 0 - - -  Feeling bad or failure about yourself  - 1 - - -  Trouble concentrating - 0 - - -  Moving slowly or fidgety/restless - 3 - - -  Suicidal thoughts - 0 - - -  PHQ-9 Score - 7 - - -  Difficult doing work/chores - Somewhat difficult - - -  Some recent data might be hidden    Review of Systems  Constitutional: Negative.   HENT: Negative.   Eyes: Negative.   Respiratory: Positive for apnea.   Cardiovascular: Positive for leg swelling.  Gastrointestinal: Positive for constipation.  Endocrine: Negative.   Genitourinary: Positive for difficulty urinating.  Musculoskeletal: Positive for back pain.  Allergic/Immunologic: Negative.   Neurological: Positive for weakness and numbness.  Psychiatric/Behavioral: Positive for confusion and dysphoric mood.  All other systems reviewed and are negative.      Objective:   Physical Exam General: No acute distress HEENT: EOMI, oral membranes moist Cards: reg rate  Chest: normal effort Abdomen: Soft, NT, ND Skin: dry, intact Extremities: no edema Skin:Clean and intact without signs of breakdown Neuro:Pt is cognitively appropriate with normal insight, memory, and awareness. Cranial nerves 2-12 are intact. ?decreased LT in  median distribution of either hands. . Reflexes are tr to absentin all 4's. Fine motor coordination is decreased. Periodic resting tremor is noted as well as occasional jerking movements of left arm. Does have some mild resting rigidity in both legs and arms. Motor function  is grossly 4-5/5 in the upper extremities and 3+ to 4/5 HF, RKE 4-/5, LKE 4/5. ADF/PF 4-5/5 bilaterallyMotor function is grossly 5/5. There is pain inhibition with use of RLE. Musculoskeletal:Still difficulty with full extension of the right lower extremity.  Right knee again remains limited in extension.  He cannot extend past -10 degrees.  He did not stand for me today as he was in his power chair.  There is crepitus in the right knee and joint effusion. He has pain with palpation along the medial lateral joint line and notable valgus to deformity is seen at the right knee.  Psych:Pt's affect is appropriate. Pt is cooperative and quite pleasant      ASSESSMENT/PLAN  1. Parkinson's disease 2. 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 There is certainly underlying tightness in core muscle mechanism although this is not true muscle spasticity 3. Endstage OA of right knee as above (lateral compartment most affected) 4. Trigger finger left 4th finger.  Left fourth finger now with slight involvement also 5. CTS RUE   Plan: 1.  Discussed the importance of maintaining activity but also energy conservation.  With his poor body mechanics excessive standing and walking will likely exacerbate his pain unfortunately. 2.  Will arrange zilretta injections to right knee.  These may provide more longer term relief than standard steroid injections for him.  Discussed heat for the hamstrings and ice for the need to help with range  of motion and pain. 4. Continuelow dose meloxicam, 7.5mg  daily with food. continue to monitor renal function,LFT's, cbc closely.  Maintain joint supplements as I have described him in the past 5. Again discussed the use of a right upper extremity hand wrist splint.  Could consider surgical referral as well as any surgery to his wrist would be low risk and potentially could lead to better quality of life.  Also consider nerve conduction study..    41minutes of face to face patient care time were spent during this visit. All questions were encouraged and answered.  Follow up in a month for injection

## 2018-05-10 ENCOUNTER — Emergency Department (HOSPITAL_COMMUNITY): Payer: Medicare Other

## 2018-05-10 ENCOUNTER — Inpatient Hospital Stay (HOSPITAL_COMMUNITY)
Admission: EM | Admit: 2018-05-10 | Discharge: 2018-06-11 | DRG: 871 | Disposition: E | Payer: Medicare Other | Attending: Critical Care Medicine | Admitting: Critical Care Medicine

## 2018-05-10 ENCOUNTER — Encounter (HOSPITAL_COMMUNITY): Payer: Self-pay

## 2018-05-10 DIAGNOSIS — R6521 Severe sepsis with septic shock: Secondary | ICD-10-CM | POA: Diagnosis present

## 2018-05-10 DIAGNOSIS — R05 Cough: Secondary | ICD-10-CM | POA: Diagnosis not present

## 2018-05-10 DIAGNOSIS — R9431 Abnormal electrocardiogram [ECG] [EKG]: Secondary | ICD-10-CM

## 2018-05-10 DIAGNOSIS — I4510 Unspecified right bundle-branch block: Secondary | ICD-10-CM | POA: Diagnosis present

## 2018-05-10 DIAGNOSIS — M545 Low back pain: Secondary | ICD-10-CM | POA: Diagnosis not present

## 2018-05-10 DIAGNOSIS — Z66 Do not resuscitate: Secondary | ICD-10-CM | POA: Diagnosis present

## 2018-05-10 DIAGNOSIS — I469 Cardiac arrest, cause unspecified: Secondary | ICD-10-CM | POA: Diagnosis not present

## 2018-05-10 DIAGNOSIS — G4733 Obstructive sleep apnea (adult) (pediatric): Secondary | ICD-10-CM | POA: Diagnosis present

## 2018-05-10 DIAGNOSIS — J9602 Acute respiratory failure with hypercapnia: Secondary | ICD-10-CM | POA: Diagnosis not present

## 2018-05-10 DIAGNOSIS — J9601 Acute respiratory failure with hypoxia: Secondary | ICD-10-CM

## 2018-05-10 DIAGNOSIS — I4891 Unspecified atrial fibrillation: Secondary | ICD-10-CM | POA: Diagnosis present

## 2018-05-10 DIAGNOSIS — M25552 Pain in left hip: Secondary | ICD-10-CM | POA: Diagnosis not present

## 2018-05-10 DIAGNOSIS — R52 Pain, unspecified: Secondary | ICD-10-CM | POA: Diagnosis not present

## 2018-05-10 DIAGNOSIS — M5489 Other dorsalgia: Secondary | ICD-10-CM | POA: Diagnosis not present

## 2018-05-10 DIAGNOSIS — N179 Acute kidney failure, unspecified: Secondary | ICD-10-CM | POA: Diagnosis present

## 2018-05-10 DIAGNOSIS — Z7982 Long term (current) use of aspirin: Secondary | ICD-10-CM

## 2018-05-10 DIAGNOSIS — Z79899 Other long term (current) drug therapy: Secondary | ICD-10-CM

## 2018-05-10 DIAGNOSIS — R402213 Coma scale, best verbal response, none, at hospital admission: Secondary | ICD-10-CM | POA: Diagnosis present

## 2018-05-10 DIAGNOSIS — J189 Pneumonia, unspecified organism: Secondary | ICD-10-CM | POA: Diagnosis not present

## 2018-05-10 DIAGNOSIS — Z888 Allergy status to other drugs, medicaments and biological substances status: Secondary | ICD-10-CM

## 2018-05-10 DIAGNOSIS — R531 Weakness: Secondary | ICD-10-CM | POA: Diagnosis not present

## 2018-05-10 DIAGNOSIS — Z86711 Personal history of pulmonary embolism: Secondary | ICD-10-CM

## 2018-05-10 DIAGNOSIS — Z4682 Encounter for fitting and adjustment of non-vascular catheter: Secondary | ICD-10-CM | POA: Diagnosis not present

## 2018-05-10 DIAGNOSIS — R0902 Hypoxemia: Secondary | ICD-10-CM | POA: Diagnosis not present

## 2018-05-10 DIAGNOSIS — I5032 Chronic diastolic (congestive) heart failure: Secondary | ICD-10-CM | POA: Diagnosis present

## 2018-05-10 DIAGNOSIS — A419 Sepsis, unspecified organism: Principal | ICD-10-CM | POA: Diagnosis present

## 2018-05-10 DIAGNOSIS — Z791 Long term (current) use of non-steroidal anti-inflammatories (NSAID): Secondary | ICD-10-CM

## 2018-05-10 DIAGNOSIS — M549 Dorsalgia, unspecified: Secondary | ICD-10-CM | POA: Diagnosis not present

## 2018-05-10 DIAGNOSIS — Z515 Encounter for palliative care: Secondary | ICD-10-CM | POA: Diagnosis present

## 2018-05-10 DIAGNOSIS — R0602 Shortness of breath: Secondary | ICD-10-CM | POA: Diagnosis not present

## 2018-05-10 DIAGNOSIS — Z96641 Presence of right artificial hip joint: Secondary | ICD-10-CM | POA: Diagnosis present

## 2018-05-10 DIAGNOSIS — I110 Hypertensive heart disease with heart failure: Secondary | ICD-10-CM | POA: Diagnosis present

## 2018-05-10 DIAGNOSIS — M109 Gout, unspecified: Secondary | ICD-10-CM | POA: Diagnosis present

## 2018-05-10 DIAGNOSIS — Z4659 Encounter for fitting and adjustment of other gastrointestinal appliance and device: Secondary | ICD-10-CM

## 2018-05-10 DIAGNOSIS — I959 Hypotension, unspecified: Secondary | ICD-10-CM | POA: Diagnosis present

## 2018-05-10 DIAGNOSIS — R740 Nonspecific elevation of levels of transaminase and lactic acid dehydrogenase [LDH]: Secondary | ICD-10-CM | POA: Diagnosis present

## 2018-05-10 DIAGNOSIS — R402313 Coma scale, best motor response, none, at hospital admission: Secondary | ICD-10-CM | POA: Diagnosis present

## 2018-05-10 DIAGNOSIS — I509 Heart failure, unspecified: Secondary | ICD-10-CM | POA: Diagnosis present

## 2018-05-10 DIAGNOSIS — E785 Hyperlipidemia, unspecified: Secondary | ICD-10-CM | POA: Diagnosis present

## 2018-05-10 DIAGNOSIS — G936 Cerebral edema: Secondary | ICD-10-CM | POA: Diagnosis present

## 2018-05-10 DIAGNOSIS — E876 Hypokalemia: Secondary | ICD-10-CM | POA: Diagnosis present

## 2018-05-10 DIAGNOSIS — R918 Other nonspecific abnormal finding of lung field: Secondary | ICD-10-CM | POA: Diagnosis not present

## 2018-05-10 DIAGNOSIS — I462 Cardiac arrest due to underlying cardiac condition: Secondary | ICD-10-CM | POA: Diagnosis present

## 2018-05-10 DIAGNOSIS — I10 Essential (primary) hypertension: Secondary | ICD-10-CM | POA: Diagnosis not present

## 2018-05-10 DIAGNOSIS — R000 Tachycardia, unspecified: Secondary | ICD-10-CM | POA: Diagnosis not present

## 2018-05-10 DIAGNOSIS — I472 Ventricular tachycardia: Secondary | ICD-10-CM | POA: Diagnosis present

## 2018-05-10 DIAGNOSIS — I639 Cerebral infarction, unspecified: Secondary | ICD-10-CM | POA: Diagnosis present

## 2018-05-10 DIAGNOSIS — E872 Acidosis: Secondary | ICD-10-CM | POA: Diagnosis present

## 2018-05-10 DIAGNOSIS — G931 Anoxic brain damage, not elsewhere classified: Secondary | ICD-10-CM | POA: Diagnosis present

## 2018-05-10 DIAGNOSIS — R402113 Coma scale, eyes open, never, at hospital admission: Secondary | ICD-10-CM | POA: Diagnosis present

## 2018-05-10 DIAGNOSIS — Z86718 Personal history of other venous thrombosis and embolism: Secondary | ICD-10-CM

## 2018-05-10 DIAGNOSIS — G20 Parkinson's disease: Secondary | ICD-10-CM | POA: Diagnosis present

## 2018-05-10 LAB — CBC WITH DIFFERENTIAL/PLATELET
Basophils Absolute: 0 10*3/uL (ref 0.0–0.1)
Basophils Relative: 0 %
Eosinophils Absolute: 0 10*3/uL (ref 0.0–0.7)
Eosinophils Relative: 0 %
HEMATOCRIT: 38.9 % — AB (ref 39.0–52.0)
HEMOGLOBIN: 12.3 g/dL — AB (ref 13.0–17.0)
LYMPHS ABS: 0.3 10*3/uL — AB (ref 0.7–4.0)
LYMPHS PCT: 5 %
MCH: 27.8 pg (ref 26.0–34.0)
MCHC: 31.6 g/dL (ref 30.0–36.0)
MCV: 88 fL (ref 78.0–100.0)
MONOS PCT: 1 %
Monocytes Absolute: 0.1 10*3/uL (ref 0.1–1.0)
NEUTROS ABS: 5.6 10*3/uL (ref 1.7–7.7)
NEUTROS PCT: 94 %
Platelets: 142 10*3/uL — ABNORMAL LOW (ref 150–400)
RBC: 4.42 MIL/uL (ref 4.22–5.81)
RDW: 14.6 % (ref 11.5–15.5)
WBC: 5.9 10*3/uL (ref 4.0–10.5)

## 2018-05-10 LAB — COMPREHENSIVE METABOLIC PANEL
ALT: 153 U/L — ABNORMAL HIGH (ref 17–63)
ANION GAP: 11 (ref 5–15)
AST: 434 U/L — ABNORMAL HIGH (ref 15–41)
Albumin: 3.9 g/dL (ref 3.5–5.0)
Alkaline Phosphatase: 150 U/L — ABNORMAL HIGH (ref 38–126)
BILIRUBIN TOTAL: 1.1 mg/dL (ref 0.3–1.2)
BUN: 36 mg/dL — ABNORMAL HIGH (ref 6–20)
CALCIUM: 9.5 mg/dL (ref 8.9–10.3)
CO2: 27 mmol/L (ref 22–32)
Chloride: 105 mmol/L (ref 101–111)
Creatinine, Ser: 1.25 mg/dL — ABNORMAL HIGH (ref 0.61–1.24)
GFR calc non Af Amer: 52 mL/min — ABNORMAL LOW (ref >60)
Glucose, Bld: 147 mg/dL — ABNORMAL HIGH (ref 65–99)
Potassium: 3.6 mmol/L (ref 3.5–5.1)
Sodium: 143 mmol/L (ref 135–145)
TOTAL PROTEIN: 7.3 g/dL (ref 6.5–8.1)

## 2018-05-10 LAB — I-STAT CG4 LACTIC ACID, ED: Lactic Acid, Venous: 2.86 mmol/L (ref 0.5–1.9)

## 2018-05-10 MED ORDER — SODIUM CHLORIDE 0.9 % IV SOLN
1.0000 g | Freq: Once | INTRAVENOUS | Status: AC
  Administered 2018-05-10: 1 g via INTRAVENOUS
  Filled 2018-05-10: qty 10

## 2018-05-10 MED ORDER — ACETAMINOPHEN 500 MG PO TABS: 1000.0000 mg | ORAL_TABLET | Freq: Once | ORAL | Status: DC

## 2018-05-10 MED ORDER — SODIUM CHLORIDE 0.9 % IV BOLUS
1000.0000 mL | Freq: Once | INTRAVENOUS | Status: AC
  Administered 2018-05-10: 1000 mL via INTRAVENOUS

## 2018-05-10 MED ORDER — SODIUM CHLORIDE 0.9 % IV SOLN
500.0000 mg | Freq: Once | INTRAVENOUS | Status: AC
  Administered 2018-05-10: 500 mg via INTRAVENOUS
  Filled 2018-05-10: qty 500

## 2018-05-10 MED ORDER — AMIODARONE HCL IN DEXTROSE 360-4.14 MG/200ML-% IV SOLN
INTRAVENOUS | Status: AC
  Filled 2018-05-10: qty 200

## 2018-05-10 MED ORDER — MORPHINE SULFATE (PF) 4 MG/ML IV SOLN
4.0000 mg | Freq: Once | INTRAVENOUS | Status: AC
  Administered 2018-05-10: 4 mg via INTRAVENOUS
  Filled 2018-05-10: qty 1

## 2018-05-10 NOTE — ED Provider Notes (Addendum)
[facility] DEPT Provider Note   CSN: [id number] Arrival date & time: 04/12/2018  2004     History   Chief Complaint Chief Complaint  Patient presents with  . Back Pain    HPI Walter Gill is a 84 y.o. male w/ h/o advanced OA of bilateral knees, lumbar spondylosis, Parkinson's, constipation, OSA, heart failure EF 60-65%, HTN here for evaluation of sudden low back pain, 10/10, constant non radiating L>R. Pain onset sudden while he was bending forward to pull up his underwear while on the commode PTA. He has been unable to ambulate at baseline since due to pain. He reports bilateral leg weakness and generalized weakness, like he can't get himself up to walk. Typically has unsteady gait due to PD but able to ambulate with walker on his own. Unsure about fevers. Wife states she heard him coughing once after taking his medicines. Denies congestion, sore throat, CP, SOB, nausea, vomiting, abdominal pain, dysuria.  Last BM 2 days ago, h/o chronic constipation. Denies saddle anesthesia, bladder or bowel incontinence. Chronic bilateral feet numbness, unchanged.   HPI  Past Medical History:  Diagnosis Date  . DVT (deep venous thrombosis) ([place])   . Gout   . Hyperlipidemia   . Hypertension   . HYPERTENSION 07/02/2007   Qualifier: Diagnosis of  By: Leanne Chang MD, Bruce    . Left ventricular dysfunction    impaired LV relaxation  . OA (osteoarthritis)   . Parkinson disease ([place])   . PE (pulmonary embolism) september 2008  . RBBB (right bundle branch block)   . Sleep apnea     Patient Active Problem List   Diagnosis Date Noted  . Trigger finger, acquired 08/02/2017  . Spastic hemiparesis affecting dominant side ([place]) 05/31/2017  . Spondylosis of lumbar region without myelopathy or radiculopathy 03/22/2017  . Posture abnormality 03/22/2017  . Hyperglycemia 03/17/2016  . Spinal stenosis of lumbar region 08/01/2015  . Urinary incontinence 01/14/2015  .  Hyperlipidemia 10/29/2014  . Chronic diastolic heart failure ([place]) 10/29/2014  . Constipation 10/24/2014  . Chronic pain syndrome 10/01/2014  . B12 deficiency 06/06/2013  . OSA on CPAP 04/17/2013  . REM behavioral disorder 10/25/2012  . DVT, HX OF 10/16/2007  . PULMONARY EMBOLISM 10/08/2007  . Gout 07/02/2007  . PARKINSON'S DISEASE 07/02/2007  . Essential hypertension 07/02/2007  . RBBB 07/02/2007  . Primary localized osteoarthritis of right knee 07/02/2007    Past Surgical History:  Procedure Laterality Date  . APPENDECTOMY    . CATARACT EXTRACTION     bilateral  . CHOLECYSTECTOMY    . dupruytens contracture     left  . EYE SURGERY     ? for lid lag vs apraxia of eye opening  . pilonidal cyst removal    . TOTAL HIP ARTHROPLASTY  9/8/8   right        Home Medications    Prior to Admission medications   Medication Sig Start Date End Date Taking? Authorizing Provider  acetaminophen (TYLENOL) 650 MG CR tablet Take two 650 mg Tabs twice daily for pain 07/22/15  Yes Kelvin Cellar, MD  allopurinol (ZYLOPRIM) 100 MG tablet Take 1 tablet (100 mg total) by mouth daily. 12/15/17  Yes Marin Olp, MD  aspirin 81 MG chewable tablet Chew 81 mg by mouth at bedtime.    Yes [provider]  bisacodyl (DULCOLAX) 5 MG EC tablet Take 2 tablets (10 mg total) by mouth every morning. 10/29/14  Yes Annita Brod, MD  carbidopa-levodopa (SINEMET CR) 50-200 MG tablet TAKE 1 TABLET 5 TIMES A DAY 12/18/17  Yes Tat, Eustace Quail, DO  cholecalciferol (VITAMIN D) 1000 units tablet Take 1,000 Units by mouth daily.   Yes [provider]  furosemide (LASIX) 40 MG tablet Take 1 tablet (40 mg total) by mouth 2 (two) times daily. 03/09/18  Yes Marin Olp, MD  glucosamine-chondroitin 500-400 MG tablet Take 1 tablet by mouth 2 (two) times daily.   Yes [provider]  KLOR-CON M20 20 MEQ tablet TAKE 1 TABLET BY MOUTH TWICE A DAY 03/08/18  Yes Marin Olp, MD    Magnesium Oxide 500 MG TABS Take 500 mg by mouth 2 (two) times daily.   Yes [provider]  meloxicam (MOBIC) 7.5 MG tablet TAKE 1 TABLET (7.5 MG TOTAL) BY MOUTH DAILY. WITH FOOD 04/30/18  Yes Meredith Staggers, MD  Mirabegron ER (MYRBETRIQ) 25 MG TB24 Take 25 mg by mouth 2 (two) times daily.    Yes [provider]  Misc Natural Products (TART CHERRY ADVANCED PO) Take 2 tablets by mouth.   Yes [provider]  nitrofurantoin, macrocrystal-monohydrate, (MACROBID) 100 MG capsule Take 100 mg by mouth at bedtime.  04/11/11  Yes [provider]  polyethylene glycol (MIRALAX / GLYCOLAX) packet Take 17 g by mouth daily. Patient taking differently: Take 17 g by mouth daily as needed for mild constipation.  10/29/14  Yes Annita Brod, MD  rosuvastatin (CRESTOR) 10 MG tablet TAKE 1 TABLET DAILY 12/18/17  Yes Marin Olp, MD  selegiline (ELDEPRYL) 5 MG capsule TAKE 1 CAPSULE TWICE DAILY BEFORE MEALS 08/28/17  Yes Tat, Eustace Quail, DO  tamsulosin (FLOMAX) 0.4 MG CAPS capsule TAKE 1 CAPSULE DAILY 07/31/17  Yes Marin Olp, MD  TURMERIC PO Take 1 tablet by mouth daily.    Yes [provider]    Family History Family History  Problem Relation Age of Onset  . Heart disease Mother        had murmur, never went to doctor  . Cancer Father        possible gi    Social History Social History   Tobacco Use  . Smoking status: Never Smoker  . Smokeless tobacco: Never Used  Substance Use Topics  . Alcohol use: No  . Drug use: No     Allergies   Metoclopramide   Review of Systems Review of Systems  Gastrointestinal: Positive for constipation (chronic).  Musculoskeletal: Positive for back pain.  Neurological: Positive for weakness (bilateal leg and generalized).  All other systems reviewed and are negative.    Physical Exam Updated Vital Signs BP (!) 144/60 (BP Location: Left Arm)   Pulse (!) 108   Temp 98 F (36.7 C) (Oral)   Resp  19   SpO2 100%   Physical Exam  Constitutional: He is oriented to person, place, and time. He appears well-developed and well-nourished. No distress.  NAD. Awake in hall bed.   HENT:  Head: Normocephalic and atraumatic.  Right Ear: External ear normal.  Left Ear: External ear normal.  Nose: Nose normal.  MMM  Eyes: Conjunctivae and EOM are normal.  Neck: Normal range of motion. Neck supple.  No midline c-spine tenderness, full PROM of neck without pain or rigidity.   Cardiovascular: Regular rhythm and normal heart sounds. Tachycardia present.  Pulses:      Radial pulses are 2+ on the right side, and 2+ on the left side.  Dorsalis pedis pulses are 1+ on the right side, and 1+ on the left side.  2+ pitting edema to knee L > R, chronic and unchanged per pt. No calf tenderness. No warmth to LE.   Pulmonary/Chest: Effort normal. He has decreased breath sounds.  Diminished breath sounds to lower lobes, difficult exam given body habitus. No respiratory distress.   Abdominal: Soft. He exhibits distension.  NT. No G/R/R.   Musculoskeletal: Normal range of motion. He exhibits no deformity.       Lumbar back: He exhibits tenderness.  T-spine: no midline or paraspinal muscle tenderness. No overlaying rash or ecchymosis.   L-spine: midline and bilateral paraspinal muscle tenderness L > R. No overlaying rash, ecchymosis.   Pelvis: no instability with AP/L compression, no focal bony tenderness to hips. Full PROM of hips, pain with IR/ER bilaterally.   Neurological: He is alert and oriented to person, place, and time.  Sensation to pinch intact in LE. 5/5 strength with ankle F/E. Unable to obtain DTR bilaterally.  Skin: Skin is warm and dry. Capillary refill takes less than 2 seconds.  Psychiatric: He has a normal mood and affect. His behavior is normal. Judgment and thought content normal.  Nursing note and vitals reviewed.    ED Treatments / Results  Labs (all labs ordered are listed,  but only abnormal results are displayed) Labs Reviewed  CBC WITH DIFFERENTIAL/PLATELET - Abnormal; Notable for the following components:      Result Value   Hemoglobin 12.3 (*)    HCT 38.9 (*)    Platelets 142 (*)    Lymphs Abs 0.3 (*)    All other components within normal limits  COMPREHENSIVE METABOLIC PANEL - Abnormal; Notable for the following components:   Glucose, Bld 147 (*)    BUN 36 (*)    Creatinine, Ser 1.25 (*)    AST 434 (*)    ALT 153 (*)    Alkaline Phosphatase 150 (*)    GFR calc non Af Amer 52 (*)    All other components within normal limits  I-STAT CG4 LACTIC ACID, ED - Abnormal; Notable for the following components:   Lactic Acid, Venous 2.86 (*)    All other components within normal limits  CULTURE, BLOOD (ROUTINE X 2)  CULTURE, BLOOD (ROUTINE X 2)  URINE CULTURE  URINALYSIS, ROUTINE W REFLEX MICROSCOPIC  I-STAT CG4 LACTIC ACID, ED    EKG None  Radiology Dg Chest 1 View  Result Date: 04/30/2018 CLINICAL DATA:  Acute onset of cough and shortness of breath. EXAM: CHEST  1 VIEW COMPARISON:  Chest radiograph performed 10/29/2014 FINDINGS: The lungs are hypoexpanded. Patchy bilateral airspace opacities could reflect pneumonia. No pleural effusion or pneumothorax is seen. The cardiomediastinal silhouette is normal in size. No acute osseous abnormalities are identified. IMPRESSION: Lungs hypoexpanded. Patchy bilateral airspace opacities could reflect pneumonia. Electronically Signed   By: Garald Balding M.D.   On: 05/07/2018 22:01   Dg Lumbar Spine Complete  Result Date: 04/14/2018 CLINICAL DATA:  Back went out while bending over, with acute onset of lower back pain radiating to the left hip. Initial encounter. EXAM: LUMBAR SPINE - COMPLETE 4+ VIEW COMPARISON:  Lumbar spine radiographs performed 10/21/2015 FINDINGS: There is no evidence of fracture or subluxation. Facet disease is noted along the lower lumbar spine. Anterior and lateral osteophytes are seen along  the lumbar spine. Vertebral bodies demonstrate normal height and alignment. Intervertebral disc spaces are preserved. The visualized bowel gas pattern is unremarkable in  appearance; air and stool are noted within the colon. The sacroiliac joints are within normal limits. Scattered calcification is seen along the abdominal aorta. The patient's right hip arthroplasty is incompletely imaged, but appears grossly unremarkable. Clips are noted within the right upper quadrant, reflecting prior cholecystectomy. IMPRESSION: No evidence of fracture or subluxation along the lumbar spine. Electronically Signed   By: Garald Balding M.D.   On: 04/24/2018 21:51   Mr Lumbar Spine Wo Contrast  Result Date: 04/11/2018 CLINICAL DATA:  Twisted back while moving, severe pain. Assess for cauda equina syndrome. EXAM: MRI LUMBAR SPINE WITHOUT CONTRAST TECHNIQUE: Multiplanar, multisequence MR imaging of the lumbar spine was performed. No intravenous contrast was administered. COMPARISON:  Lumbar spine radiographs May 10, 2018 and MRI lumbar spine July 20, 2015. FINDINGS: SEGMENTATION: For the purposes of this report, the last well-formed intervertebral disc is reported as L5-S1. ALIGNMENT: Maintained lumbar lordosis. No malalignment. Levoscoliosis. VERTEBRAE:Vertebral bodies are intact. Moderate disc height loss associated with scoliosis, diffuse desiccation and moderate to severe subacute on chronic discogenic endplate changes. Multilevel mild disc edema. Scattered hemangiomas most conspicuous at T12. No suspicious or acute bone marrow signal. CONUS MEDULLARIS AND CAUDA EQUINA: Conus medullaris terminates at L1 and demonstrates normal morphology and signal characteristics. Cauda equina is normal. PARASPINAL AND OTHER SOFT TISSUES: Nonacute. Stool distended rectum. Moderate paraspinal muscle atrophy. DISC LEVELS: T11-12: Small chronic central disc extrusion. Mild facet arthropathy and LEFT costovertebral arthropathy. No canal stenosis.  No neural foraminal narrowing. T12-L1: Old small LEFT extraforaminal disc osteophyte complex without canal stenosis or neural foraminal narrowing. L1-2: Old small RIGHT extraforaminal disc osteophyte complex, mild facet arthropathy and ligamentum flavum redundancy. Minimal canal stenosis. Mild LEFT neural foraminal narrowing. L2-3: Stable small broad-based disc bulge, small subarticular disc protrusions. Mild to moderate facet arthropathy and ligamentum flavum redundancy. Annular fissure. Mild canal stenosis. Mild to moderate RIGHT, mild LEFT neural foraminal narrowing. L3-4: Stable small broad-based disc bulge, moderate to severe RIGHT and moderate LEFT facet arthropathy and ligamentum flavum redundancy. Mild canal stenosis. Mild-to-moderate bilateral neural foraminal narrowing. L4-5: Stable broad-based disc bulge, large RIGHT extraforaminal disc osteophyte complex. Moderate to severe facet arthropathy and ligamentum flavum redundancy. Moderate canal stenosis. Moderate RIGHT and mild LEFT neural foraminal narrowing. L5-S1: Stable small broad-based disc bulge, large LEFT extraforaminal disc osteophyte complex which may affect the exited LEFT L5 nerve. Mild facet arthropathy without canal stenosis. Mild LEFT neural foraminal narrowing. IMPRESSION: 1. No fracture, malalignment or acute osseous process. 2. Moderate canal stenosis L4-5, improved from prior imaging. Mild canal stenosis L2-3, L3-4. 3. Neural foraminal narrowing L1-2 through L5-S1: Moderate on the RIGHT at L4-5. 4. Old small T11-12 disc extrusion. 5. Stool distended rectum. Electronically Signed   By: Elon Alas M.D.   On: 05/05/2018 22:38   Dg Hip Unilat W Or Wo Pelvis 2-3 Views Left  Result Date: 04/15/2018 CLINICAL DATA:  Acute onset of lower back pain radiating to the left hip. Bent over this afternoon and back went out. Initial encounter. EXAM: DG HIP (WITH OR WITHOUT PELVIS) 2-3V LEFT COMPARISON:  None. FINDINGS: There is no evidence of  fracture or dislocation. The patient's right hip arthroplasty is grossly unremarkable in appearance, though incompletely imaged. There is no definite evidence of loosening. The proximal left femur appears intact. Degenerative change is noted along the lower lumbar spine. The visualized bowel gas pattern is grossly unremarkable in appearance. IMPRESSION: No evidence of fracture or dislocation. Hardware intact. Electronically Signed   By: Francoise Schaumann.D.  On: 04/26/2018 22:00    Procedures .Critical Care Performed by: Kinnie Feil, PA-C Authorized by: Kinnie Feil, PA-C   Critical care provider statement:    Critical care time (minutes):  90   Critical care was necessary to treat or prevent imminent or life-threatening deterioration of the following conditions:  Cardiac failure (VT with cardiac arrest)   Critical care was time spent personally by me on the following activities:  Obtaining history from patient or surrogate, examination of patient, evaluation of patient's response to treatment, discussions with consultants, development of treatment plan with patient or surrogate, ordering and review of radiographic studies, pulse oximetry, re-evaluation of patient's condition and review of old charts   I assumed direction of critical care for this patient from another provider in my specialty: no     (including critical care time)  Medications Ordered in ED Medications  acetaminophen (TYLENOL) tablet 1,000 mg (has no administration in time range)  amiodarone (NEXTERONE PREMIX) 360-4.14 MG/200ML-% (1.8 mg/mL) IV infusion (has no administration in time range)  propofol (DIPRIVAN) 1000 MG/100ML infusion (has no administration in time range)  amiodarone (NEXTERONE) 1.8 mg/mL load via infusion 150 mg (has no administration in time range)    Followed by  amiodarone (NEXTERONE PREMIX) 360-4.14 MG/200ML-% (1.8 mg/mL) IV infusion (has no administration in time range)    Followed by    amiodarone (NEXTERONE PREMIX) 360-4.14 MG/200ML-% (1.8 mg/mL) IV infusion (has no administration in time range)  morphine 4 MG/ML injection 4 mg (4 mg Intravenous Given 04/13/2018 2139)  cefTRIAXone (ROCEPHIN) 1 g in sodium chloride 0.9 % 100 mL IVPB (1 g Intravenous New Bag/Given 04/18/2018 2316)  azithromycin (ZITHROMAX) 500 mg in sodium chloride 0.9 % 250 mL IVPB (500 mg Intravenous New Bag/Given 04/11/2018 2316)  sodium chloride 0.9 % bolus 1,000 mL (1,000 mLs Intravenous New Bag/Given 04/13/2018 2316)     Initial Impression / Assessment and Plan / ED Course  I have reviewed the triage vital signs and the nursing notes.  Pertinent labs & imaging results that were available during my care of the patient were reviewed by me and considered in my medical decision making (see chart for details).  Clinical Course as of May 11 25  Thu May 10, 2018  2237 IMPRESSION: Lungs hypoexpanded. Patchy bilateral airspace opacities could reflect pneumonia.    DG Chest 1 View [CG]  2258 IMPRESSION: 1. No fracture, malalignment or acute osseous process. 2. Moderate canal stenosis L4-5, improved from prior imaging. Mild canal stenosis L2-3, L3-4. 3. Neural foraminal narrowing L1-2 through L5-S1: Moderate on the RIGHT at L4-5. 4. Old small T11-12 disc extrusion. 5. Stool distended rectum.    MR LUMBAR SPINE WO CONTRAST [CG]  2320 Lactic Acid, Venous(!!): 2.86 [CG]    Clinical Course User Index [CG] Kinnie Feil, PA-C   84 year old here for sudden, severe lumbar back pain.  He has unsteady gait at baseline secondary to Parkinson's disease.  Typically able to ambulate with walker on his own.  On exam, he has reproducible L-spine midline and paraspinal muscular tenderness.  No cauda equina s/s.  He was unable to stand up and ambulate with 2 person assist, was endorsing generalized weakness.  He was noted to have palpable warmth.  Tachycardic. Given age, sudden change in baseline gait, will obtain  screening labs, imaging, to r/o occult infection. Unable to obtain rectal temp while in hall bed.   2300: Imaging reviewed remarkable for patchy bilateral opacities suspicious for pneumonia, this fits clinical  picture of decreased breath sounds bilaterally, generalized weakness, tachycardia.  MRI without acute findings.  Lactic 2.86. Lab work pending.  Rectal temperature pending.  Patient discussed with Dr. Darl Householder, decision made to start IV fluids and antibiotics for CAP.   patient will be handed off to oncoming ED PA who will follow-up on lab work.  Given his age, weakness, sudden change in baseline ambulatory status consider admission.  PT/OT evaluation for recommendations may be beneficial and help with placement as needed.  Final Clinical Impressions(s) / ED Diagnoses   0015: Obtained rectal temp 105 F. Pt coded during my last examination prior to hand off. VT, shocked x 1, cardiac arrested. ROSC after approx 10 min.   Code ran by Dr Darl Householder, consulting PCCM. Admit to ICU.  Final diagnoses:  Generalized weakness  Community acquired pneumonia, unspecified laterality    ED Discharge Orders    None       Arlean Hopping 04/13/2018 2322    Drenda Freeze, MD 05/07/2018 2350    Kinnie Feil, PA-C 05/11/18 0026    Kinnie Feil, PA-C 05/11/18 5188    Drenda Freeze, MD 05/11/18 0157

## 2018-05-10 NOTE — ED Triage Notes (Signed)
Pt is from home and trying to transfer after using the bathroom and twisted his back

## 2018-05-10 NOTE — ED Triage Notes (Signed)
EMS gave 50 mcg of fentanyl in route

## 2018-05-10 NOTE — ED Notes (Signed)
Bed: [place] Expected date:  Expected time:  Means of arrival:  Comments: 84 yr old male back pain

## 2018-05-10 NOTE — ED Notes (Signed)
Patient transported to MRI 

## 2018-05-10 NOTE — ED Notes (Signed)
I gave critical I stat CG4 results to MD Darl Householder

## 2018-05-11 ENCOUNTER — Telehealth: Payer: Self-pay | Admitting: Neurology

## 2018-05-11 ENCOUNTER — Inpatient Hospital Stay (HOSPITAL_COMMUNITY): Payer: Medicare Other

## 2018-05-11 ENCOUNTER — Emergency Department (HOSPITAL_COMMUNITY): Payer: Medicare Other

## 2018-05-11 DIAGNOSIS — Z86718 Personal history of other venous thrombosis and embolism: Secondary | ICD-10-CM | POA: Diagnosis not present

## 2018-05-11 DIAGNOSIS — R6521 Severe sepsis with septic shock: Secondary | ICD-10-CM | POA: Diagnosis present

## 2018-05-11 DIAGNOSIS — I4891 Unspecified atrial fibrillation: Secondary | ICD-10-CM | POA: Diagnosis present

## 2018-05-11 DIAGNOSIS — I4510 Unspecified right bundle-branch block: Secondary | ICD-10-CM | POA: Diagnosis present

## 2018-05-11 DIAGNOSIS — I472 Ventricular tachycardia: Secondary | ICD-10-CM | POA: Diagnosis present

## 2018-05-11 DIAGNOSIS — I5032 Chronic diastolic (congestive) heart failure: Secondary | ICD-10-CM | POA: Diagnosis present

## 2018-05-11 DIAGNOSIS — G931 Anoxic brain damage, not elsewhere classified: Secondary | ICD-10-CM | POA: Diagnosis not present

## 2018-05-11 DIAGNOSIS — J189 Pneumonia, unspecified organism: Secondary | ICD-10-CM | POA: Diagnosis present

## 2018-05-11 DIAGNOSIS — R402213 Coma scale, best verbal response, none, at hospital admission: Secondary | ICD-10-CM | POA: Diagnosis present

## 2018-05-11 DIAGNOSIS — Z4682 Encounter for fitting and adjustment of non-vascular catheter: Secondary | ICD-10-CM | POA: Diagnosis not present

## 2018-05-11 DIAGNOSIS — G4733 Obstructive sleep apnea (adult) (pediatric): Secondary | ICD-10-CM | POA: Diagnosis present

## 2018-05-11 DIAGNOSIS — I959 Hypotension, unspecified: Secondary | ICD-10-CM | POA: Diagnosis present

## 2018-05-11 DIAGNOSIS — E872 Acidosis: Secondary | ICD-10-CM | POA: Diagnosis present

## 2018-05-11 DIAGNOSIS — G936 Cerebral edema: Secondary | ICD-10-CM | POA: Diagnosis present

## 2018-05-11 DIAGNOSIS — I462 Cardiac arrest due to underlying cardiac condition: Secondary | ICD-10-CM | POA: Diagnosis present

## 2018-05-11 DIAGNOSIS — R918 Other nonspecific abnormal finding of lung field: Secondary | ICD-10-CM | POA: Diagnosis not present

## 2018-05-11 DIAGNOSIS — R402113 Coma scale, eyes open, never, at hospital admission: Secondary | ICD-10-CM | POA: Diagnosis present

## 2018-05-11 DIAGNOSIS — S0990XA Unspecified injury of head, initial encounter: Secondary | ICD-10-CM | POA: Diagnosis not present

## 2018-05-11 DIAGNOSIS — A419 Sepsis, unspecified organism: Secondary | ICD-10-CM | POA: Diagnosis present

## 2018-05-11 DIAGNOSIS — N179 Acute kidney failure, unspecified: Secondary | ICD-10-CM | POA: Diagnosis present

## 2018-05-11 DIAGNOSIS — J9601 Acute respiratory failure with hypoxia: Secondary | ICD-10-CM | POA: Diagnosis present

## 2018-05-11 DIAGNOSIS — I509 Heart failure, unspecified: Secondary | ICD-10-CM | POA: Diagnosis present

## 2018-05-11 DIAGNOSIS — Z86711 Personal history of pulmonary embolism: Secondary | ICD-10-CM | POA: Diagnosis not present

## 2018-05-11 DIAGNOSIS — J9690 Respiratory failure, unspecified, unspecified whether with hypoxia or hypercapnia: Secondary | ICD-10-CM | POA: Diagnosis not present

## 2018-05-11 DIAGNOSIS — R402313 Coma scale, best motor response, none, at hospital admission: Secondary | ICD-10-CM | POA: Diagnosis present

## 2018-05-11 DIAGNOSIS — I110 Hypertensive heart disease with heart failure: Secondary | ICD-10-CM | POA: Diagnosis present

## 2018-05-11 DIAGNOSIS — I639 Cerebral infarction, unspecified: Secondary | ICD-10-CM | POA: Diagnosis present

## 2018-05-11 DIAGNOSIS — I469 Cardiac arrest, cause unspecified: Secondary | ICD-10-CM | POA: Diagnosis not present

## 2018-05-11 DIAGNOSIS — R9431 Abnormal electrocardiogram [ECG] [EKG]: Secondary | ICD-10-CM

## 2018-05-11 DIAGNOSIS — J9602 Acute respiratory failure with hypercapnia: Secondary | ICD-10-CM | POA: Diagnosis present

## 2018-05-11 LAB — RESPIRATORY PANEL BY PCR
Adenovirus: NOT DETECTED
BORDETELLA PERTUSSIS-RVPCR: NOT DETECTED
CORONAVIRUS 229E-RVPPCR: NOT DETECTED
CORONAVIRUS OC43-RVPPCR: NOT DETECTED
Chlamydophila pneumoniae: NOT DETECTED
Coronavirus HKU1: NOT DETECTED
Coronavirus NL63: NOT DETECTED
INFLUENZA B-RVPPCR: NOT DETECTED
Influenza A: NOT DETECTED
METAPNEUMOVIRUS-RVPPCR: NOT DETECTED
Mycoplasma pneumoniae: NOT DETECTED
PARAINFLUENZA VIRUS 1-RVPPCR: NOT DETECTED
PARAINFLUENZA VIRUS 2-RVPPCR: NOT DETECTED
PARAINFLUENZA VIRUS 3-RVPPCR: NOT DETECTED
PARAINFLUENZA VIRUS 4-RVPPCR: NOT DETECTED
RESPIRATORY SYNCYTIAL VIRUS-RVPPCR: NOT DETECTED
RHINOVIRUS / ENTEROVIRUS - RVPPCR: NOT DETECTED

## 2018-05-11 LAB — CBG MONITORING, ED: GLUCOSE-CAPILLARY: 214 mg/dL — AB (ref 65–99)

## 2018-05-11 LAB — URINALYSIS, ROUTINE W REFLEX MICROSCOPIC
BILIRUBIN URINE: NEGATIVE
GLUCOSE, UA: NEGATIVE mg/dL
KETONES UR: NEGATIVE mg/dL
Leukocytes, UA: NEGATIVE
NITRITE: NEGATIVE
Protein, ur: NEGATIVE mg/dL
SPECIFIC GRAVITY, URINE: 1.014 (ref 1.005–1.030)
pH: 5 (ref 5.0–8.0)

## 2018-05-11 LAB — CBC
HCT: 32.3 % — ABNORMAL LOW (ref 39.0–52.0)
HEMOGLOBIN: 9.9 g/dL — AB (ref 13.0–17.0)
MCH: 27.7 pg (ref 26.0–34.0)
MCHC: 30.7 g/dL (ref 30.0–36.0)
MCV: 90.2 fL (ref 78.0–100.0)
Platelets: 178 10*3/uL (ref 150–400)
RBC: 3.58 MIL/uL — ABNORMAL LOW (ref 4.22–5.81)
RDW: 14.8 % (ref 11.5–15.5)
WBC: 18.8 10*3/uL — AB (ref 4.0–10.5)

## 2018-05-11 LAB — BLOOD GAS, ARTERIAL
Acid-Base Excess: 0.4 mmol/L (ref 0.0–2.0)
Acid-base deficit: 3.4 mmol/L — ABNORMAL HIGH (ref 0.0–2.0)
Acid-base deficit: 7.4 mmol/L — ABNORMAL HIGH (ref 0.0–2.0)
BICARBONATE: 22.6 mmol/L (ref 20.0–28.0)
Bicarbonate: 20.9 mmol/L (ref 20.0–28.0)
Bicarbonate: 25.8 mmol/L (ref 20.0–28.0)
DRAWN BY: 235321
Drawn by: 235321
Drawn by: 235321
FIO2: 100
FIO2: 100
FIO2: 100
LHR: 16 {breaths}/min
LHR: 24 {breaths}/min
MECHVT: 620 mL
MECHVT: 620 mL
O2 SAT: 80.8 %
O2 SAT: 98.9 %
O2 Saturation: 98.7 %
PATIENT TEMPERATURE: 102
PCO2 ART: 50.5 mmHg — AB (ref 32.0–48.0)
PCO2 ART: 69.4 mmHg — AB (ref 32.0–48.0)
PEEP: 10 cmH2O
PEEP: 10 cmH2O
PEEP: 5 cmH2O
PO2 ART: 244 mmHg — AB (ref 83.0–108.0)
PO2 ART: 72.7 mmHg — AB (ref 83.0–108.0)
Patient temperature: 105
Patient temperature: 38.3
RATE: 24 resp/min
VT: 620 mL
pCO2 arterial: 52.7 mmHg — ABNORMAL HIGH (ref 32.0–48.0)
pH, Arterial: 7.132 — CL (ref 7.350–7.450)
pH, Arterial: 7.269 — ABNORMAL LOW (ref 7.350–7.450)
pH, Arterial: 7.336 — ABNORMAL LOW (ref 7.350–7.450)
pO2, Arterial: 287 mmHg — ABNORMAL HIGH (ref 83.0–108.0)

## 2018-05-11 LAB — PROTIME-INR
INR: 1.35
PROTHROMBIN TIME: 16.6 s — AB (ref 11.4–15.2)

## 2018-05-11 LAB — GLUCOSE, CAPILLARY
GLUCOSE-CAPILLARY: 186 mg/dL — AB (ref 65–99)
Glucose-Capillary: 172 mg/dL — ABNORMAL HIGH (ref 65–99)
Glucose-Capillary: 156 mg/dL — ABNORMAL HIGH (ref 65–99)

## 2018-05-11 LAB — LACTIC ACID, PLASMA
LACTIC ACID, VENOUS: 7.5 mmol/L — AB (ref 0.5–1.9)
LACTIC ACID, VENOUS: 7.6 mmol/L — AB (ref 0.5–1.9)
Lactic Acid, Venous: 9.7 mmol/L (ref 0.5–1.9)

## 2018-05-11 LAB — PROCALCITONIN: Procalcitonin: 5.31 ng/mL

## 2018-05-11 LAB — BASIC METABOLIC PANEL
Anion gap: 17 — ABNORMAL HIGH (ref 5–15)
BUN: 37 mg/dL — ABNORMAL HIGH (ref 6–20)
CHLORIDE: 100 mmol/L — AB (ref 101–111)
CO2: 25 mmol/L (ref 22–32)
Calcium: 8.3 mg/dL — ABNORMAL LOW (ref 8.9–10.3)
Creatinine, Ser: 1.84 mg/dL — ABNORMAL HIGH (ref 0.61–1.24)
GFR calc non Af Amer: 32 mL/min — ABNORMAL LOW (ref >60)
GFR, EST AFRICAN AMERICAN: 38 mL/min — AB (ref >60)
Glucose, Bld: 204 mg/dL — ABNORMAL HIGH (ref 65–99)
POTASSIUM: 3 mmol/L — AB (ref 3.5–5.1)
SODIUM: 142 mmol/L (ref 135–145)

## 2018-05-11 LAB — POCT I-STAT 3, VENOUS BLOOD GAS (G3P V)
BICARBONATE: 27.3 mmol/L (ref 20.0–28.0)
O2 SAT: 42 %
TCO2: 29 mmol/L (ref 22–32)
pCO2, Ven: 55.4 mmHg (ref 44.0–60.0)
pH, Ven: 7.3 (ref 7.250–7.430)
pO2, Ven: 27 mmHg — CL (ref 32.0–45.0)

## 2018-05-11 LAB — APTT
APTT: 43 s — AB (ref 24–36)
APTT: 45 s — AB (ref 24–36)

## 2018-05-11 LAB — MAGNESIUM
MAGNESIUM: 3.9 mg/dL — AB (ref 1.7–2.4)
Magnesium: 3.4 mg/dL — ABNORMAL HIGH (ref 1.7–2.4)

## 2018-05-11 LAB — BRAIN NATRIURETIC PEPTIDE: B Natriuretic Peptide: 61.8 pg/mL (ref 0.0–100.0)

## 2018-05-11 LAB — PHOSPHORUS
PHOSPHORUS: 1.4 mg/dL — AB (ref 2.5–4.6)
Phosphorus: 2.5 mg/dL (ref 2.5–4.6)

## 2018-05-11 LAB — CORTISOL: CORTISOL PLASMA: 30.4 ug/dL

## 2018-05-11 LAB — TROPONIN I: Troponin I: 0.71 ng/mL (ref <0.03)

## 2018-05-11 MED ORDER — PANTOPRAZOLE SODIUM 40 MG PO PACK
40.0000 mg | PACK | Freq: Every day | ORAL | Status: DC
  Filled 2018-05-11: qty 20

## 2018-05-11 MED ORDER — NOREPINEPHRINE BITARTRATE 1 MG/ML IV SOLN
0.0000 ug/min | INTRAVENOUS | Status: DC
  Administered 2018-05-11: 15 ug/min via INTRAVENOUS
  Filled 2018-05-11: qty 4

## 2018-05-11 MED ORDER — ATROPINE SULFATE 1 MG/10ML IJ SOSY
PREFILLED_SYRINGE | INTRAMUSCULAR | Status: AC
  Filled 2018-05-11: qty 20

## 2018-05-11 MED ORDER — STERILE WATER FOR INJECTION IV SOLN
INTRAVENOUS | Status: DC
  Administered 2018-05-11: 05:00:00 via INTRAVENOUS
  Filled 2018-05-11 (×2): qty 850

## 2018-05-11 MED ORDER — FENTANYL CITRATE (PF) 100 MCG/2ML IJ SOLN: 50.0000 ug | INTRAMUSCULAR | Status: DC | PRN

## 2018-05-11 MED ORDER — AMIODARONE HCL IN DEXTROSE 360-4.14 MG/200ML-% IV SOLN: 30.0000 mg/h | INTRAVENOUS | Status: DC

## 2018-05-11 MED ORDER — AMIODARONE HCL IN DEXTROSE 360-4.14 MG/200ML-% IV SOLN: 60.0000 mg/h | INTRAVENOUS | Status: DC

## 2018-05-11 MED ORDER — SODIUM BICARBONATE 8.4 % IV SOLN: 50.0000 meq | Freq: Once | INTRAVENOUS | Status: DC

## 2018-05-11 MED ORDER — SODIUM CHLORIDE 0.9 % IV SOLN
250.0000 mL | INTRAVENOUS | Status: DC | PRN
Start: 2018-05-11 — End: 2018-05-12

## 2018-05-11 MED ORDER — DOCUSATE SODIUM 50 MG/5ML PO LIQD: 100.0000 mg | Freq: Two times a day (BID) | ORAL | Status: DC

## 2018-05-11 MED ORDER — SODIUM CHLORIDE 0.9 % IV SOLN: INTRAVENOUS | Status: DC

## 2018-05-11 MED ORDER — NOREPINEPHRINE BITARTRATE 1 MG/ML IV SOLN
0.0000 ug/min | Freq: Once | INTRAVENOUS | Status: DC
  Filled 2018-05-11: qty 4

## 2018-05-11 MED ORDER — SODIUM CHLORIDE 0.9 % IV BOLUS (SEPSIS): 1000.0000 mL | Freq: Once | INTRAVENOUS | Status: DC

## 2018-05-11 MED ORDER — SODIUM BICARBONATE 8.4 % IV SOLN
INTRAVENOUS | Status: AC
  Filled 2018-05-11: qty 50

## 2018-05-11 MED ORDER — MAGNESIUM SULFATE 40 G IN LACTATED RINGERS - SIMPLE
1.0000 g/h | INTRAVENOUS | Status: DC
  Administered 2018-05-11: 1 g/h via INTRAVENOUS
  Filled 2018-05-11: qty 80

## 2018-05-11 MED ORDER — MIDAZOLAM HCL 2 MG/2ML IJ SOLN: 1.0000 mg | INTRAMUSCULAR | Status: DC | PRN

## 2018-05-11 MED ORDER — PROPOFOL 1000 MG/100ML IV EMUL: 5.0000 ug/kg/min | Freq: Once | INTRAVENOUS | Status: DC

## 2018-05-11 MED ORDER — VANCOMYCIN HCL 10 G IV SOLR: 1250.0000 mg | INTRAVENOUS | Status: DC

## 2018-05-11 MED ORDER — TENECTEPLASE 50 MG IV KIT
50.0000 mg | PACK | INTRAVENOUS | Status: AC
  Administered 2018-05-11: 50 mg via INTRAVENOUS
  Filled 2018-05-11: qty 10

## 2018-05-11 MED ORDER — ACETAMINOPHEN 160 MG/5ML PO SOLN
650.0000 mg | Freq: Four times a day (QID) | ORAL | Status: DC | PRN
  Administered 2018-05-11: 650 mg
  Filled 2018-05-11: qty 20.3

## 2018-05-11 MED ORDER — ACETAMINOPHEN 650 MG RE SUPP: 650.0000 mg | Freq: Four times a day (QID) | RECTAL | Status: DC | PRN

## 2018-05-11 MED ORDER — CHLORHEXIDINE GLUCONATE 0.12% ORAL RINSE (MEDLINE KIT)
15.0000 mL | Freq: Two times a day (BID) | OROMUCOSAL | Status: DC
  Administered 2018-05-11 (×2): 15 mL via OROMUCOSAL

## 2018-05-11 MED ORDER — ORAL CARE MOUTH RINSE
15.0000 mL | OROMUCOSAL | Status: DC
Start: 2018-05-11 — End: 2018-05-12
  Administered 2018-05-11 (×5): 15 mL via OROMUCOSAL

## 2018-05-11 MED ORDER — SODIUM CHLORIDE 0.9 % IV BOLUS (SEPSIS): 500.0000 mL | Freq: Once | INTRAVENOUS | Status: DC

## 2018-05-11 MED ORDER — VANCOMYCIN HCL 10 G IV SOLR
2500.0000 mg | INTRAVENOUS | Status: AC
  Administered 2018-05-11: 2500 mg via INTRAVENOUS
  Filled 2018-05-11: qty 500

## 2018-05-11 MED ORDER — POTASSIUM CHLORIDE 10 MEQ/100ML IV SOLN
10.0000 meq | INTRAVENOUS | Status: DC
  Administered 2018-05-11: 10 meq via INTRAVENOUS
  Filled 2018-05-11: qty 100

## 2018-05-11 MED ORDER — INSULIN ASPART 100 UNIT/ML ~~LOC~~ SOLN
1.0000 [IU] | SUBCUTANEOUS | Status: DC
  Administered 2018-05-11 (×2): 2 [IU] via SUBCUTANEOUS

## 2018-05-11 MED ORDER — MORPHINE SULFATE (PF) 2 MG/ML IV SOLN: 2.0000 mg | INTRAVENOUS | Status: DC | PRN

## 2018-05-11 MED ORDER — PIPERACILLIN-TAZOBACTAM 3.375 G IVPB
3.3750 g | Freq: Three times a day (TID) | INTRAVENOUS | Status: DC
  Filled 2018-05-11 (×2): qty 50

## 2018-05-11 MED ORDER — AMIODARONE LOAD VIA INFUSION
150.0000 mg | Freq: Once | INTRAVENOUS | Status: DC
  Filled 2018-05-11: qty 83.34

## 2018-05-11 MED ORDER — SODIUM CHLORIDE 0.9 % IV SOLN
INTRAVENOUS | Status: DC
  Administered 2018-05-11: 05:00:00 via INTRAVENOUS

## 2018-05-11 MED ORDER — PIPERACILLIN-TAZOBACTAM 3.375 G IVPB 30 MIN
3.3750 g | INTRAVENOUS | Status: DC
  Filled 2018-05-11: qty 50

## 2018-05-11 MED ORDER — NOREPINEPHRINE 4 MG/250ML-% IV SOLN
0.0000 ug/min | INTRAVENOUS | Status: DC
  Administered 2018-05-11: 15 ug/min via INTRAVENOUS
  Administered 2018-05-11 (×2): 20 ug/min via INTRAVENOUS
  Filled 2018-05-11 (×6): qty 250

## 2018-05-11 MED FILL — Medication: Qty: 1 | Status: AC

## 2018-05-11 NOTE — ED Notes (Signed)
Report given to [facility], RN and carelink called for transport to [facility]

## 2018-05-11 NOTE — Progress Notes (Signed)
Nutrition Brief Note  Chart reviewed. Pt with poor prognosis. Plans to withdraw support when family arrives. No nutrition interventions warranted at this time.  Please consult as needed.   Molli Barrows, RD, LDN, [place] Pager [phone] After Hours Pager [phone]

## 2018-05-11 NOTE — Progress Notes (Signed)
Nursing Note: The family has gone home to nap and collect their thoughts.  The daughter from [place] is current en route to [place] this evening.  The family has asked that we continue the ventilator and levophed support until they convene at the bedside this evening after everyone has come in from out out town.  At which point they have asked for a few moments of peace to say their goodbyes and they would like to terminally wean this evening.  They are to let us know when this is to occur.    This nurse has advised Dr. [name] who was rounding in the unit and he was understanding and noted to advise CCM when they are ready to proceed.

## 2018-05-11 NOTE — Progress Notes (Signed)
Met pt's wife and friends outside of room while staff was w/pt. She said they belong to [place] and wanted to get in touch w/their perish. I reached Rev. Mack and arrived w/in 40 minutes. He provided prayer and ritual for family and also visited. Pt's wife was told of pt's condition but had not called their children. She was concerned b/c of the time of night. After discussion she decided to call one daughter who called her siblings. One child is in [place], one in [place] and one in [place]. I offered empathic care, pastoral presence and tried to address other concerns during our visit. Pt is being transferred to [facility]. I will make Chaplain aware of pt's arrival for follow-up support. Please page [phone] if assistance is needed at [facility]. [place], MDiv   05/11/18 0300  Clinical Encounter Type  Visited With Family

## 2018-05-11 NOTE — Progress Notes (Signed)
EEG Completed; Results Pending  

## 2018-05-11 NOTE — Progress Notes (Signed)
Pharmacy Antibiotic Note  Walter Gill is a 84 y.o. male admitted on 04/26/2018 with sepsis.  Pharmacy has been consulted for zosyn and vancomycin dosing.  Plan: Zosyn 3.375g IV q8h (4 hour infusion).  Vancomycin 2500 mg x1 then 1250 mg IV q24h for est AUC = 539 Goal AUC = 400-500 Daily Scr F/u cultures/levels    Temp (24hrs), Avg:98 F (36.7 C), Min:98 F (36.7 C), Max:98 F (36.7 C)  Recent Labs  Lab 04/18/2018 2127 04/12/2018 2305  WBC 5.9  --   CREATININE 1.25*  --   LATICACIDVEN  --  2.86*    Estimated Creatinine Clearance: 54.7 mL/min (A) (by C-G formula based on SCr of 1.25 mg/dL (H)).    Allergies  Allergen Reactions  . Metoclopramide Other (See Comments)    Interference with Sinemet, blocks dopamine leading to sedation    Antimicrobials this admission: 5/30 roc/zmax >> x1 ED 5/31 zosyn >>  5/31 vancomycin >>  Dose adjustments this admission:   Microbiology results:  BCx:   UCx:    Sputum:    MRSA PCR:   Thank you for allowing pharmacy to be a part of this patient's care.  Dorrene German 05/11/2018 12:49 AM

## 2018-05-11 NOTE — H&P (Signed)
PULMONARY / CRITICAL CARE MEDICINE   Name: Walter Gill MRN: [id number] DOB: [birth year]    ADMISSION DATE:  05/09/2018 CONSULTATION DATE: 05/11/18  REFERRING MD:  Dr Donney Rankins (ER)  CHIEF COMPLAINT: Cardiac arrest  HISTORY OF PRESENT ILLNESS:   54yoM with hx PE/DVT, Parkinson's, Grade 1 diastolic dysfunction, OA, HTN, Gout, and OSA, who presented to the [facility] on 5/30 PM c/o 10/10 low back pain following twisting his back when transferring off the toilet at home. While in the ER, CXR (@ 2132) showed mild bibasilar infiltrates. And Lactate (@ 2305) was elevated at 2.86. There was concern for pneumonia, and patient was given Ceftriaxone and Azithromycin (@ 2316). Patient then had VT cardiac arrest (23:55-00:09) during which he was shocked x 1, epi x 3, 2g Mag, Amio 300mg  and infusion started, bicarb given. He had a second cardiac arrest, this time PEA (0355-9741) with epi x 2, bicarb x 2, 2g Magnesium, and Levophed infusion started. He had a third cardiac arrest, again PEA (6384-5364) with epi x 1, calcium x 1, bicarb x 2 given.   PCCM was consulted and arrived in middle of the 3rd code. It appears patient had Temp 105 rectally at 00:15. EKG @ 0024 (following the 1st code) showed Afib @ 78; ST depression in lateral leads, RBBB, QTc 609, and QRS 177. Of note this was after being given azithromycin and amiodarone. Patient's wife doesn't report any history of QT prolongation though.  Following the 3rd code, patient was profoundly hypoxic as well as hypotensive, and concern was for possible massive PE given that he has a history of PE/DVT (61yrs ago per his wife, not on anticoag at this time). Patient given systemic lytics (TNK) empirically.   ABG (0045): 7.132 / 69 / 72 / 20.9 / 80%  ABG (0111): 7.269 / 52 / 244 / 22.6 / 98% (after lytics given; FIO2 100%, 10 PEEP, RR 24) ABG (0204): 7.33 / 50.5 / 287 / 25.8 / 98% (on same vent settings as above)  Patient's wife says that prior to coming in  to the ER tonight he had been feeling fine and had no cough, sob, cp, abd pain, n/v/d, dysuria, f/c, or lethargy. She says he had been his normal self and was not acting sick. She says that 3 days ago he chocked on some food and was coughing at that time but not since.   PAST MEDICAL HISTORY :  He  has a past medical history of DVT (deep venous thrombosis) ([place]), Gout, Hyperlipidemia, Hypertension, HYPERTENSION (07/02/2007), Left ventricular dysfunction, OA (osteoarthritis), Parkinson disease ([place]), PE (pulmonary embolism) (september 2008), RBBB (right bundle branch block), and Sleep apnea.  PAST SURGICAL HISTORY: He  has a past surgical history that includes Appendectomy; Cholecystectomy; dupruytens contracture; Total hip arthroplasty (9/8/8); pilonidal cyst removal; Eye surgery; and Cataract extraction.  Allergies  Allergen Reactions  . Metoclopramide Other (See Comments)    Interference with Sinemet, blocks dopamine leading to sedation    No current facility-administered medications on file prior to encounter.    Current Outpatient Medications on File Prior to Encounter  Medication Sig  . acetaminophen (TYLENOL) 650 MG CR tablet Take two 650 mg Tabs twice daily for pain  . allopurinol (ZYLOPRIM) 100 MG tablet Take 1 tablet (100 mg total) by mouth daily.  Marland Kitchen aspirin 81 MG chewable tablet Chew 81 mg by mouth at bedtime.   . bisacodyl (DULCOLAX) 5 MG EC tablet Take 2 tablets (10 mg total) by mouth every morning.  Marland Kitchen  carbidopa-levodopa (SINEMET CR) 50-200 MG tablet TAKE 1 TABLET 5 TIMES A DAY  . cholecalciferol (VITAMIN D) 1000 units tablet Take 1,000 Units by mouth daily.  . furosemide (LASIX) 40 MG tablet Take 1 tablet (40 mg total) by mouth 2 (two) times daily.  Marland Kitchen glucosamine-chondroitin 500-400 MG tablet Take 1 tablet by mouth 2 (two) times daily.  Marland Kitchen KLOR-CON M20 20 MEQ tablet TAKE 1 TABLET BY MOUTH TWICE A DAY  . Magnesium Oxide 500 MG TABS Take 500 mg by mouth 2 (two) times daily.  .  meloxicam (MOBIC) 7.5 MG tablet TAKE 1 TABLET (7.5 MG TOTAL) BY MOUTH DAILY. WITH FOOD  . Mirabegron ER (MYRBETRIQ) 25 MG TB24 Take 25 mg by mouth 2 (two) times daily.   . Misc Natural Products (TART CHERRY ADVANCED PO) Take 2 tablets by mouth.  . nitrofurantoin, macrocrystal-monohydrate, (MACROBID) 100 MG capsule Take 100 mg by mouth at bedtime.   . polyethylene glycol (MIRALAX / GLYCOLAX) packet Take 17 g by mouth daily. (Patient taking differently: Take 17 g by mouth daily as needed for mild constipation. )  . rosuvastatin (CRESTOR) 10 MG tablet TAKE 1 TABLET DAILY  . selegiline (ELDEPRYL) 5 MG capsule TAKE 1 CAPSULE TWICE DAILY BEFORE MEALS  . tamsulosin (FLOMAX) 0.4 MG CAPS capsule TAKE 1 CAPSULE DAILY  . TURMERIC PO Take 1 tablet by mouth daily.    FAMILY HISTORY:  His indicated that his mother is deceased. He indicated that his father is deceased. He indicated that his sister is deceased. He indicated that only one of his two brothers is alive.  SOCIAL HISTORY: He  reports that he has never smoked. He has never used smokeless tobacco. He reports that he does not drink alcohol or use drugs.  REVIEW OF SYSTEMS:   Review of Systems  Unable to perform ROS: Critical illness   SUBJECTIVE:  Intubated and unresponsive  VITAL SIGNS: BP (!) 108/50   Pulse (!) 102   Temp (!) 100.9 F (38.3 C) (Rectal)   Resp (!) 23   SpO2 100%   HEMODYNAMICS:  Levophed @ 50mcg  VENTILATOR SETTINGS: Vent Mode: PRVC FiO2 (%):  [100 %] 100 % Set Rate:  [16 bmp-24 bmp] 24 bmp Vt Set:  [440 mL] 620 mL PEEP:  [5 cmH20-10 cmH20] 10 cmH20 Plateau Pressure:  [18 cmH20] 18 cmH20  INTAKE / OUTPUT: No intake/output data recorded.  PHYSICAL EXAMINATION: General: Elderly male, intubated, critically ill Neuro: Right pupil malformed (history of cataract surgery in that eye), left pupil 89mm and unreactive, no response to sternal rub; no cough to ETT suction HEENT: Orally intubated, scant bloody secretions  in ETT Cardiovascular: Tachycardic to 100's, irreg irreg Lungs: CTA b/l anteriorly and laterally Abdomen: Obese soft NTND, BS hypoactive Musculoskeletal: 2-3+ BLE edema Skin: no rashes   LABS:  BMET Recent Labs  Lab 04/30/2018 2127  NA 143  K 3.6  CL 105  CO2 27  BUN 36*  CREATININE 1.25*  GLUCOSE 147*   Electrolytes Recent Labs  Lab 04/19/2018 2127  CALCIUM 9.5   CBC Recent Labs  Lab 04/28/2018 2127  WBC 5.9  HGB 12.3*  HCT 38.9*  PLT 142*   Coag's No results for input(s): APTT, INR in the last 168 hours.  Sepsis Markers Recent Labs  Lab 04/17/2018 2305  LATICACIDVEN 2.86*   ABG No results for input(s): PHART, PCO2ART, PO2ART in the last 168 hours.  Liver Enzymes Recent Labs  Lab 05/08/2018 2127  AST 434*  ALT 153*  ALKPHOS 150*  BILITOT 1.1  ALBUMIN 3.9   Cardiac Enzymes No results for input(s): TROPONINI, PROBNP in the last 168 hours.  Glucose Recent Labs  Lab 05/11/18 0141  GLUCAP 214*   Imaging Dg Chest 1 View  Result Date: 04/25/2018 CLINICAL DATA:  Acute onset of cough and shortness of breath. EXAM: CHEST  1 VIEW COMPARISON:  Chest radiograph performed 10/29/2014 FINDINGS: The lungs are hypoexpanded. Patchy bilateral airspace opacities could reflect pneumonia. No pleural effusion or pneumothorax is seen. The cardiomediastinal silhouette is normal in size. No acute osseous abnormalities are identified. IMPRESSION: Lungs hypoexpanded. Patchy bilateral airspace opacities could reflect pneumonia. Electronically Signed   By: Garald Balding M.D.   On: 05/02/2018 22:01   Dg Lumbar Spine Complete  Result Date: 05/09/2018 CLINICAL DATA:  Back went out while bending over, with acute onset of lower back pain radiating to the left hip. Initial encounter. EXAM: LUMBAR SPINE - COMPLETE 4+ VIEW COMPARISON:  Lumbar spine radiographs performed 10/21/2015 FINDINGS: There is no evidence of fracture or subluxation. Facet disease is noted along the lower lumbar  spine. Anterior and lateral osteophytes are seen along the lumbar spine. Vertebral bodies demonstrate normal height and alignment. Intervertebral disc spaces are preserved. The visualized bowel gas pattern is unremarkable in appearance; air and stool are noted within the colon. The sacroiliac joints are within normal limits. Scattered calcification is seen along the abdominal aorta. The patient's right hip arthroplasty is incompletely imaged, but appears grossly unremarkable. Clips are noted within the right upper quadrant, reflecting prior cholecystectomy. IMPRESSION: No evidence of fracture or subluxation along the lumbar spine. Electronically Signed   By: Garald Balding M.D.   On: 04/24/2018 21:51   Mr Lumbar Spine Wo Contrast  Result Date: 05/05/2018 CLINICAL DATA:  Twisted back while moving, severe pain. Assess for cauda equina syndrome. EXAM: MRI LUMBAR SPINE WITHOUT CONTRAST TECHNIQUE: Multiplanar, multisequence MR imaging of the lumbar spine was performed. No intravenous contrast was administered. COMPARISON:  Lumbar spine radiographs May 10, 2018 and MRI lumbar spine July 20, 2015. FINDINGS: SEGMENTATION: For the purposes of this report, the last well-formed intervertebral disc is reported as L5-S1. ALIGNMENT: Maintained lumbar lordosis. No malalignment. Levoscoliosis. VERTEBRAE:Vertebral bodies are intact. Moderate disc height loss associated with scoliosis, diffuse desiccation and moderate to severe subacute on chronic discogenic endplate changes. Multilevel mild disc edema. Scattered hemangiomas most conspicuous at T12. No suspicious or acute bone marrow signal. CONUS MEDULLARIS AND CAUDA EQUINA: Conus medullaris terminates at L1 and demonstrates normal morphology and signal characteristics. Cauda equina is normal. PARASPINAL AND OTHER SOFT TISSUES: Nonacute. Stool distended rectum. Moderate paraspinal muscle atrophy. DISC LEVELS: T11-12: Small chronic central disc extrusion. Mild facet arthropathy  and LEFT costovertebral arthropathy. No canal stenosis. No neural foraminal narrowing. T12-L1: Old small LEFT extraforaminal disc osteophyte complex without canal stenosis or neural foraminal narrowing. L1-2: Old small RIGHT extraforaminal disc osteophyte complex, mild facet arthropathy and ligamentum flavum redundancy. Minimal canal stenosis. Mild LEFT neural foraminal narrowing. L2-3: Stable small broad-based disc bulge, small subarticular disc protrusions. Mild to moderate facet arthropathy and ligamentum flavum redundancy. Annular fissure. Mild canal stenosis. Mild to moderate RIGHT, mild LEFT neural foraminal narrowing. L3-4: Stable small broad-based disc bulge, moderate to severe RIGHT and moderate LEFT facet arthropathy and ligamentum flavum redundancy. Mild canal stenosis. Mild-to-moderate bilateral neural foraminal narrowing. L4-5: Stable broad-based disc bulge, large RIGHT extraforaminal disc osteophyte complex. Moderate to severe facet arthropathy and ligamentum flavum redundancy. Moderate canal stenosis. Moderate RIGHT and mild LEFT  neural foraminal narrowing. L5-S1: Stable small broad-based disc bulge, large LEFT extraforaminal disc osteophyte complex which may affect the exited LEFT L5 nerve. Mild facet arthropathy without canal stenosis. Mild LEFT neural foraminal narrowing. IMPRESSION: 1. No fracture, malalignment or acute osseous process. 2. Moderate canal stenosis L4-5, improved from prior imaging. Mild canal stenosis L2-3, L3-4. 3. Neural foraminal narrowing L1-2 through L5-S1: Moderate on the RIGHT at L4-5. 4. Old small T11-12 disc extrusion. 5. Stool distended rectum. Electronically Signed   By: Elon Alas M.D.   On: 04/28/2018 22:38   Dg Chest Port 1 View  Result Date: 05/11/2018 CLINICAL DATA:  Acute onset of respiratory failure. Status post code. EXAM: PORTABLE CHEST 1 VIEW COMPARISON:  Chest radiograph performed earlier today at 12:13 a.m. FINDINGS: The patient's endotracheal tube  is seen ending 4-5 cm above the carina. An enteric tube is noted extending below the diaphragm. There is elevation of the right hemidiaphragm. Bilateral airspace opacification has improved, with improved lung expansion. Patchy bilateral airspace opacities may reflect pneumonia or pulmonary edema. No definite pleural effusion or pneumothorax is seen. The cardiomediastinal silhouette is mildly enlarged. No acute osseous abnormalities are identified. External pacing pads are noted. IMPRESSION: 1. Endotracheal tube seen ending 4-5 cm above the carina. 2. Bilateral airspace opacification has improved, with improved lung expansion. Patchy bilateral airspace opacities may reflect pneumonia or pulmonary edema. 3. Mild cardiomegaly. Electronically Signed   By: Garald Balding M.D.   On: 05/11/2018 01:45   Dg Chest Port 1 View  Result Date: 05/11/2018 CLINICAL DATA:  Post intubation, ETT EXAM: PORTABLE CHEST 1 VIEW COMPARISON:  04/16/2018 FINDINGS: Endotracheal tube terminates 1.5 cm above the carina. Multifocal patchy opacities in the bilateral upper and lower lobes, right upper lobe predominant, new. This appearance suggests multifocal pneumonia, possibly on the basis of aspiration. No pleural effusion or pneumothorax. Cardiomegaly. IMPRESSION: Endotracheal tube terminates 1.5 cm above the carina. Multifocal patchy opacities, right upper lobe predominant, suspicious for multifocal pneumonia, possibly on the basis of aspiration. Electronically Signed   By: Julian Hy M.D.   On: 05/11/2018 00:47   Dg Hip Unilat W Or Wo Pelvis 2-3 Views Left  Result Date: 05/06/2018 CLINICAL DATA:  Acute onset of lower back pain radiating to the left hip. Bent over this afternoon and back went out. Initial encounter. EXAM: DG HIP (WITH OR WITHOUT PELVIS) 2-3V LEFT COMPARISON:  None. FINDINGS: There is no evidence of fracture or dislocation. The patient's right hip arthroplasty is grossly unremarkable in appearance, though  incompletely imaged. There is no definite evidence of loosening. The proximal left femur appears intact. Degenerative change is noted along the lower lumbar spine. The visualized bowel gas pattern is grossly unremarkable in appearance. IMPRESSION: No evidence of fracture or dislocation. Hardware intact. Electronically Signed   By: Garald Balding M.D.   On: 05/02/2018 22:00   STUDIES:  MRI Lumbar spine (5/30): 1. No fracture, malalignment or acute osseous process. 2. Moderate canal stenosis L4-5, improved from prior imaging. Mild canal stenosis L2-3, L3-4. 3. Neural foraminal narrowing L1-2 through L5-S1: Moderate on the RIGHT at L4-5. 4. Old small T11-12 disc extrusion. 5. Stool distended rectum.  CXR (5/31): Endotracheal tube terminates 1.5 cm above the carina. Multifocal patchy opacities, right upper lobe predominant, suspicious for multifocal pneumonia, possibly on the basis of aspiration  CULTURES: Blood cultures (5/30): pending  Urine culture (5/31): pending Sputum culture (5/31): ordered   ANTIBIOTICS: Ceftriaxone 5/30 Azithromycin 5/30 Vancomycin 5/31> Zosyn 5/31>  SIGNIFICANT EVENTS: 5/30:  presented to [facility] ER with back pain >> found to have bibasilar infiltrates and lactic acidosis 5/31: 54min VT arrest, 42min PEA arrest, 19min PEA arrest. Intubated and started Levophed. Transfer to [employer]  LINES/TUBES: PIV's ETT 5/31>> Foley 5/31>> OG tube 5/31>>  DISCUSSION: 84yoM with hx PE/DVT, Parkinson's, Grade 1 diastolic dysfunction, OA, HTN, Gout, and OSA, who presented to the [facility] on 5/30 PM c/o 10/10 low back pain following twisting his back when transferring off the toilet at home. While in the ER, found to have fever, pneumonia, and lactic acidosis. Then had 3 cardiac arrests (VT, PEA, PEA), total 32 min CPR). EKG post cardiac arrest showed significant QTc prolongation. Patient also found to have acute hypoxic respiratory failure and shock.   ASSESSMENT /  PLAN:  PULMONARY 1. Acute Hypoxic and Hypercapneic Respiratory Failure; Questionable PE; Pneumonia: - CXR on my review shows ETT on correct position; Bilateral pulmonary infiltrates are present - Multiple ABG's taken revealing hypercapnea as well as hypoxia; adjusted vent as needed. Repeat ABG's improved oxygenation and ventilation. - Concern for possible PE given history of PE and severe hypoxia; Systemic lytics given.  - Pneumonia addressed below in ID section.   2. Hx OSA: - will need CPAP following extubation  CARDIOVASCULAR 1. Cardiac arrest; QT prolongation; Hx Diastolic dysfunction and HTN: - 19min VT arrest, followed by 35min PEA arrest, followed by 82min PEA arrest. - Unclear etiology for these cardiac arrests (QT prolongation vs Hypercapnea vs Sepsis vs Cardiac ischemia vs PE).  - Severe QT prolongation seen on EKG following the first arrest. Presume the QT prolongation was due to the Amiodarone and Azithromycin given. No old EKG's for comparison. Patient's wife denies history of QT prolongation. Stopped the Amiodarone. Changed antibiotics. Received Magnesium IV push during code. Start Bicarb infusion and Magnesium infusion. Monitor EKG q6hrs. If QTc does not improve, may need to start an Isoproterenol infusion.  - First ABG was following 2nd cardiac arrest and showed severe hypercapnea; unclear if patient may have been hypercapneic prior to the first arrest. Certainly he received opiate pain medications which could possibly have caused hypercapnea. However, RN reports patient was wide awake and conversive prior to the first code, so it is unlikely that the arrest was due to primary hypercapnea.  - EKG shows ST depression in lateral leads; Troponin pending. Will monitor PTT following the lytics and start Heparin infusion when appropriate.  - ordered TTE - will need Cards consult  RENAL 1. AKI; Hypokalemia: - creatinine 1.25 up from a baseline of 1.01 - place foley; start IVF boluses;  monitor UOP. Avoid nephrotoxic agents.  - mild hypokalemia at 3.6; replete with 40MEQ KCL IV  GASTROINTESTINAL 1. Transaminitis: - elevated LFT's that were drawn PRIOR to the cardiac arrest and were not in the setting of any hypotension or hypoperfusion at that time. Hepatocellular pattern with elevated AST and ALT but normal Tbili. Continue to monitor daily.  - NPO; GI prophylaxis  HEMATOLOGIC 1. Hx PE/DVT: hx of PE 27yrs ago, not on anticoag anymore. Empirically given systemic lytics in ER out of concern for possible PE. Monitor PTT's via protocol and start heparin infusion when appropriate  INFECTIOUS 1. Septic Shock: due to Pneumonia (presumed Aspiration vs CAP) - panculture, give 30cc/kg IVF bolus, change antibiotics to Vanc and Zosyn. Check procalcitonin and cortisol; trend lactate.  - check Respiratory virus panel - continue levophed and wean as tolerated - will need Central line and A-line - APAP PRN and Cooling blanket  ENDOCRINE 1. Hyperglyciemia without history of DM: - NPO; SSI PRN  NEUROLOGIC 1. Anoxic Encephalopathy; Hx Parkinson's:  - received 87mcg Fentanyl via EMS on way to [facility] ER. Received Morphine 4mg  in the [facility] ER for his back pain prior to the arrest; did not receive any sedation or paralytics with intubation or since then. Is not waking up. Concern for anoxic encephalopathy. Not a candidate for therapeutic hypothermia given his sepsis.  - will need Head CT once stable to get it; order EEG.   FAMILY  - Updates: updated patient's wife in the [facility] ER at bedside. She wants to keep patient FULL CODE for now.  - Inter-disciplinary family meet or Palliative Care meeting due by: 05/17/18  60 minutes nonprocedural critical care time  Vernie Murders, MD  Pulmonary and [place] Pager: [phone]  05/11/2018, 1:50 AM

## 2018-05-11 NOTE — Progress Notes (Signed)
Nursing Note: CDS called and updated plan of care with coordinator.  Unless family wishes to pursue formal brain death testing, this nurse was advised to call back with cardiac time of death.

## 2018-05-11 NOTE — Progress Notes (Signed)
84 year old man with Parkinson's admitted 5/30 with bibasilar infiltrates and fever and unfortunately suffered multiple cardiac arrest in the ED. He was given systemic X empirically after the third arrest. Remains hypotensive on Levophed drip.  This 9 a.m. on exam, unresponsive, comatose with GCS 3, pupils bilateral pin point not reactive, decreased breath sounds bilateral, corneal reflex present and spontaneous breathing on the respirator. After 2 hours around 10:45 AM his blood pressure dropped suddenly, right pupil now 3 mm not reactive to light and left 2 mm,   Head CT confirms multiple infarcts, cerebral edema and impending herniation.  Detailed discussion with wife [name] at bedside, she is very clear that if he had brain damage he would not want to be on indefinite life support. DNR issued We will Levophed at current dose of 20 mics. She is waiting for children to arrive from out of state and once they have had a chance to visit will withdraw life support  My critical care time x 68m  Kara Mead MD. Shade Flood. [place] [facility] Pager [phone] If no response call [phone]   05/11/2018

## 2018-05-11 NOTE — ED Notes (Signed)
Called [place] on call @0115 

## 2018-05-11 NOTE — Telephone Encounter (Signed)
Spoke with wife to tell her thinking about her and to make sure that we couldn't help her with anything.  She thanked me for the call

## 2018-05-11 NOTE — Procedures (Signed)
ELECTROENCEPHALOGRAM REPORT  Date of Study: 05/11/2018  Patient's Name: Walter Gill MRN: [id number] Date of Birth: [birth year]  Referring Provider: Dr. Vernie Murders  Clinical History: This is an 84 year old man s/p cardiac arrest.  Medications: No sedating medications during study  Technical Summary: A multichannel digital EEG recording measured by the international 10-20 system with electrodes applied with paste and impedances below 5000 ohms performed in our laboratory with EKG monitoring in an intubated and unresponsive patient.  Hyperventilation and photic stimulation were not performed.  The digital EEG was referentially recorded, reformatted, and digitally filtered in a variety of bipolar and referential montages for optimal display.    Description: The patient is intubated and unresponsive during the recording. No sedating medication listed. There is loss of normal background activity. The record read at a sensitivity of 3 uV/mm shows diffuse suppression of background activity. There is no spontaneous reactivity or reactivity noted with noxious stimulation. There is muscle artifact over the frontal leads. Hyperventilation and photic stimulation were not performed. There were no epileptiform discharges or electrographic seizures seen.   EKG lead was unremarkable.  Impression: This EEG is markedly abnormal due to diffuse background suppression and lack of EEG reactivity with noxious stimulation.  Clinical Correlation of the above findings indicates severe diffuse cerebral dysfunction that is non-specific in etiology and can be seen in the setting of anoxic/ischemic injury, toxic/metabolic encephalopathies, or medication effect. In the absence of CNS active, sedating, or anesthetic medications, this suggests a poor prognosis. Clinical correlation is advised.  Ellouise Newer, M.D.

## 2018-05-12 LAB — MRSA CULTURE: Culture: NOT DETECTED

## 2018-05-12 MED ORDER — FENTANYL BOLUS VIA INFUSION: 50.0000 ug | INTRAVENOUS | Status: DC | PRN

## 2018-05-12 MED ORDER — FENTANYL 2500MCG IN NS 250ML (10MCG/ML) PREMIX INFUSION: 100.0000 ug/h | INTRAVENOUS | Status: DC

## 2018-05-12 DEATH — deceased

## 2018-05-13 LAB — URINE CULTURE

## 2018-05-14 ENCOUNTER — Telehealth: Payer: Self-pay

## 2018-05-14 NOTE — Telephone Encounter (Incomplete)
Received dc from [facility] on [address].  The dc will be taking to Pulmonary unit for signature.

## 2018-05-14 NOTE — Telephone Encounter (Signed)
On 05/14/18 I received the d/c back from Doctor [name].  I got the d/c ready and called the funeral home to let them know the d/c is ready for pickup.  I also faxed a copy to the funeral home per the funeral home request.

## 2018-05-16 LAB — CULTURE, BLOOD (ROUTINE X 2)
CULTURE: NO GROWTH
Culture: NO GROWTH
SPECIAL REQUESTS: ADEQUATE
Special Requests: ADEQUATE

## 2018-05-24 ENCOUNTER — Ambulatory Visit: Payer: Medicare Other | Admitting: Family Medicine

## 2018-05-30 ENCOUNTER — Ambulatory Visit: Payer: Medicare Other | Admitting: Physical Medicine & Rehabilitation

## 2018-06-11 NOTE — Progress Notes (Signed)
Pt asystole on the monitor. Pt's heart and lungs asultated. Pt pronounced by Deboraha Sprang, RN and Woodroe Chen, RN. Family at bedside.

## 2018-06-11 NOTE — Discharge Summary (Signed)
84 year old man with Parkinson's admitted 5/30 with bibasilar infiltrates and fever and unfortunately suffered multiple cardiac arrest in the ED. He was given systemic thromolytics empirically after the third arrest. Remains hypotensive on Levophed drip.   Head CT confirms multiple infarcts, cerebral edema and impending herniation.  Detailed discussion with wife [name] at bedside, she is very clear that if he had brain damage he would not want to be on indefinite life support. DNR issued  After family had a chance to visit, life support was withdrawn and he passed away soon after.  Cause of death -acute respiratory failure due to community-acquired pneumonia and anoxic encephalopathy post cardiac arrest  Comorbidities including Parkinson's  Walter Gill V. [name] MD  05/15/2018

## 2018-06-11 NOTE — Progress Notes (Signed)
Patient terminally extubated per MD order at [facility].

## 2018-06-11 NOTE — Progress Notes (Signed)
eLink Physician-Brief Progress Note Patient Name: Walter Gill DOB: [birth year] MRN: [id number]   Date of Service  05-25-18  HPI/Events of Note  Talked by phone with Jimmye Norman, patient's wife.  Family members are at bedside and wish to proceed with withdrawal of life-sustaining treatment.  Several discussions regarding EOL during the day on 5/31.  eICU Interventions  Orders for withdrawal of life-sustaining treatment activated.     Intervention Category Major Interventions: End of life / care limitation discussion  [place] 2018/05/25, 12:02 AM

## 2018-06-11 DEATH — deceased

## 2018-06-15 IMAGING — DX DG CHEST 1V PORT
1 series · 1 of 1 positions shown · non-contrast
Comparison: Chest radiograph performed earlier today at [DATE] a.m.

CLINICAL DATA: Acute onset of respiratory failure. Status post
code.

EXAM:
PORTABLE CHEST 1 VIEW

[abdomen kub]
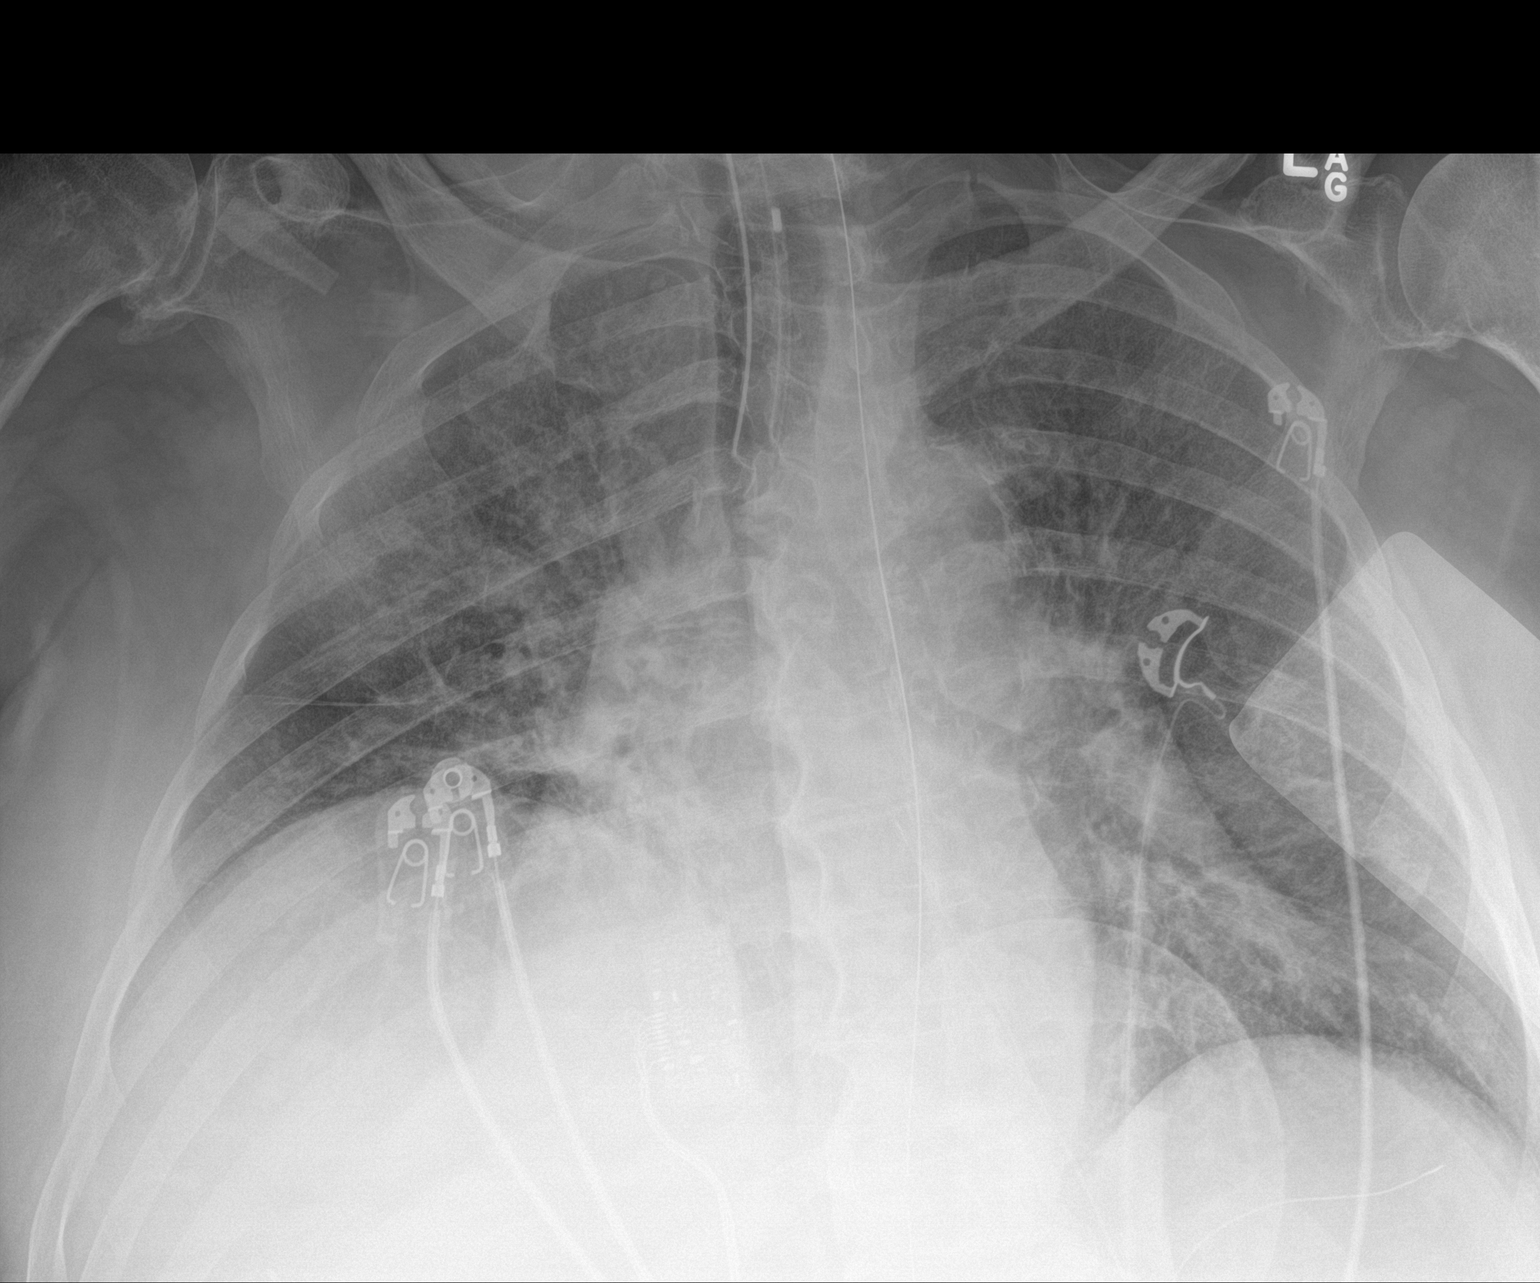

[1 of 1 positions shown; findings below may reference images not displayed]

FINDINGS: The patient's endotracheal tube is seen ending 4-5 cm above the
carina. An enteric tube is noted extending below the diaphragm.

There is elevation of the right hemidiaphragm. Bilateral airspace
opacification has improved, with improved lung expansion. Patchy
bilateral airspace opacities may reflect pneumonia or pulmonary
edema. No definite pleural effusion or pneumothorax is seen.

The cardiomediastinal silhouette is mildly enlarged. No acute
osseous abnormalities are identified. External pacing pads are
noted.
IMPRESSION: 1. Endotracheal tube seen ending 4-5 cm above the carina.
2. Bilateral airspace opacification has improved, with improved lung
expansion. Patchy bilateral airspace opacities may reflect pneumonia
or pulmonary edema.
3. Mild cardiomegaly.

## 2018-06-26 ENCOUNTER — Ambulatory Visit: Payer: Medicare Other | Admitting: Neurology

## 2018-11-15 LAB — BLOOD GAS, ARTERIAL
